# Patient Record
Sex: Female | Born: 1950 | Race: Black or African American | Hispanic: No | Marital: Married | Smoking: Never smoker
Health system: Southern US, Community
[De-identification: ages and names within clinical notes are randomized; demographics above are authoritative.]

## PROBLEM LIST (undated history)

## (undated) DIAGNOSIS — T7840XA Allergy, unspecified, initial encounter: Secondary | ICD-10-CM

## (undated) DIAGNOSIS — M170 Bilateral primary osteoarthritis of knee: Secondary | ICD-10-CM

## (undated) DIAGNOSIS — M1990 Unspecified osteoarthritis, unspecified site: Secondary | ICD-10-CM

## (undated) DIAGNOSIS — K219 Gastro-esophageal reflux disease without esophagitis: Secondary | ICD-10-CM

## (undated) DIAGNOSIS — I10 Essential (primary) hypertension: Secondary | ICD-10-CM

## (undated) DIAGNOSIS — S2239XA Fracture of one rib, unspecified side, initial encounter for closed fracture: Secondary | ICD-10-CM

## (undated) DIAGNOSIS — S2249XA Multiple fractures of ribs, unspecified side, initial encounter for closed fracture: Secondary | ICD-10-CM

## (undated) DIAGNOSIS — K5792 Diverticulitis of intestine, part unspecified, without perforation or abscess without bleeding: Secondary | ICD-10-CM

## (undated) DIAGNOSIS — G5600 Carpal tunnel syndrome, unspecified upper limb: Secondary | ICD-10-CM

## (undated) HISTORY — PX: UPPER GASTROINTESTINAL ENDOSCOPY: SHX188

## (undated) HISTORY — DX: Multiple fractures of ribs, unspecified side, initial encounter for closed fracture: S22.49XA

## (undated) HISTORY — DX: Bilateral primary osteoarthritis of knee: M17.0

## (undated) HISTORY — DX: Allergy, unspecified, initial encounter: T78.40XA

## (undated) HISTORY — DX: Essential (primary) hypertension: I10

## (undated) HISTORY — DX: Fracture of one rib, unspecified side, initial encounter for closed fracture: S22.39XA

## (undated) HISTORY — DX: Unspecified osteoarthritis, unspecified site: M19.90

## (undated) HISTORY — PX: COLONOSCOPY: SHX174

## (undated) HISTORY — DX: Gastro-esophageal reflux disease without esophagitis: K21.9

## (undated) HISTORY — DX: Carpal tunnel syndrome, unspecified upper limb: G56.00

---

## 1997-06-17 HISTORY — PX: SHOULDER SURGERY: SHX246

## 1997-10-12 ENCOUNTER — Encounter: Admission: RE | Admit: 1997-10-12 | Discharge: 1998-01-10 | Payer: Self-pay | Admitting: Orthopedic Surgery

## 1998-01-17 ENCOUNTER — Encounter: Admission: RE | Admit: 1998-01-17 | Discharge: 1998-04-17 | Payer: Self-pay | Admitting: Orthopedic Surgery

## 1998-06-12 ENCOUNTER — Encounter: Payer: Self-pay | Admitting: Orthopedic Surgery

## 1998-06-12 ENCOUNTER — Ambulatory Visit (HOSPITAL_COMMUNITY): Admission: RE | Admit: 1998-06-12 | Discharge: 1998-06-12 | Payer: Self-pay | Admitting: Orthopedic Surgery

## 1998-06-17 HISTORY — PX: CHOLECYSTECTOMY: SHX55

## 1998-06-29 ENCOUNTER — Encounter: Admission: RE | Admit: 1998-06-29 | Discharge: 1998-09-27 | Payer: Self-pay | Admitting: Orthopedic Surgery

## 1998-10-02 ENCOUNTER — Encounter: Admission: RE | Admit: 1998-10-02 | Discharge: 1998-11-02 | Payer: Self-pay

## 1998-10-30 ENCOUNTER — Encounter: Payer: Self-pay | Admitting: Orthopedic Surgery

## 1998-10-30 ENCOUNTER — Ambulatory Visit (HOSPITAL_COMMUNITY): Admission: RE | Admit: 1998-10-30 | Discharge: 1998-10-30 | Payer: Self-pay | Admitting: Orthopedic Surgery

## 1998-12-08 ENCOUNTER — Ambulatory Visit (HOSPITAL_COMMUNITY): Admission: RE | Admit: 1998-12-08 | Discharge: 1998-12-08 | Payer: Self-pay | Admitting: Orthopedic Surgery

## 1999-02-01 ENCOUNTER — Other Ambulatory Visit: Admission: RE | Admit: 1999-02-01 | Discharge: 1999-02-01 | Payer: Self-pay | Admitting: Obstetrics

## 1999-02-28 ENCOUNTER — Ambulatory Visit (HOSPITAL_COMMUNITY): Admission: RE | Admit: 1999-02-28 | Discharge: 1999-02-28 | Payer: Self-pay | Admitting: Cardiology

## 1999-03-30 ENCOUNTER — Encounter: Payer: Self-pay | Admitting: Cardiology

## 1999-03-30 ENCOUNTER — Ambulatory Visit (HOSPITAL_COMMUNITY): Admission: RE | Admit: 1999-03-30 | Discharge: 1999-03-30 | Payer: Self-pay | Admitting: Cardiology

## 1999-06-20 ENCOUNTER — Ambulatory Visit (HOSPITAL_COMMUNITY): Admission: RE | Admit: 1999-06-20 | Discharge: 1999-06-20 | Payer: Self-pay | Admitting: Orthopedic Surgery

## 1999-06-20 ENCOUNTER — Encounter: Payer: Self-pay | Admitting: Orthopedic Surgery

## 1999-07-02 ENCOUNTER — Encounter (HOSPITAL_BASED_OUTPATIENT_CLINIC_OR_DEPARTMENT_OTHER): Payer: Self-pay | Admitting: General Surgery

## 1999-07-04 ENCOUNTER — Ambulatory Visit (HOSPITAL_COMMUNITY): Admission: RE | Admit: 1999-07-04 | Discharge: 1999-07-05 | Payer: Self-pay | Admitting: General Surgery

## 1999-07-04 ENCOUNTER — Encounter (HOSPITAL_BASED_OUTPATIENT_CLINIC_OR_DEPARTMENT_OTHER): Payer: Self-pay | Admitting: General Surgery

## 1999-07-04 ENCOUNTER — Encounter (INDEPENDENT_AMBULATORY_CARE_PROVIDER_SITE_OTHER): Payer: Self-pay | Admitting: *Deleted

## 1999-08-02 ENCOUNTER — Encounter (HOSPITAL_BASED_OUTPATIENT_CLINIC_OR_DEPARTMENT_OTHER): Payer: Self-pay | Admitting: General Surgery

## 1999-08-02 ENCOUNTER — Ambulatory Visit (HOSPITAL_COMMUNITY): Admission: RE | Admit: 1999-08-02 | Discharge: 1999-08-02 | Payer: Self-pay | Admitting: General Surgery

## 2000-02-18 ENCOUNTER — Inpatient Hospital Stay (HOSPITAL_COMMUNITY): Admission: EM | Admit: 2000-02-18 | Discharge: 2000-02-19 | Payer: Self-pay | Admitting: *Deleted

## 2000-02-18 ENCOUNTER — Encounter: Payer: Self-pay | Admitting: Emergency Medicine

## 2000-02-19 ENCOUNTER — Encounter: Payer: Self-pay | Admitting: Cardiovascular Disease

## 2000-04-25 ENCOUNTER — Encounter: Admission: RE | Admit: 2000-04-25 | Discharge: 2000-05-20 | Payer: Self-pay | Admitting: Orthopedic Surgery

## 2000-05-22 ENCOUNTER — Ambulatory Visit (HOSPITAL_COMMUNITY): Admission: RE | Admit: 2000-05-22 | Discharge: 2000-05-22 | Payer: Self-pay | Admitting: Gastroenterology

## 2000-05-22 ENCOUNTER — Encounter (INDEPENDENT_AMBULATORY_CARE_PROVIDER_SITE_OTHER): Payer: Self-pay | Admitting: Specialist

## 2000-10-10 ENCOUNTER — Ambulatory Visit (HOSPITAL_COMMUNITY): Admission: RE | Admit: 2000-10-10 | Discharge: 2000-10-10 | Payer: Self-pay | Admitting: Gastroenterology

## 2000-11-06 ENCOUNTER — Other Ambulatory Visit: Admission: RE | Admit: 2000-11-06 | Discharge: 2000-11-06 | Payer: Self-pay | Admitting: Obstetrics

## 2000-11-10 ENCOUNTER — Encounter: Payer: Self-pay | Admitting: Cardiology

## 2000-11-10 ENCOUNTER — Ambulatory Visit (HOSPITAL_COMMUNITY): Admission: RE | Admit: 2000-11-10 | Discharge: 2000-11-10 | Payer: Self-pay | Admitting: Cardiology

## 2000-11-12 ENCOUNTER — Ambulatory Visit (HOSPITAL_COMMUNITY): Admission: RE | Admit: 2000-11-12 | Discharge: 2000-11-12 | Payer: Self-pay | Admitting: Obstetrics

## 2000-11-12 ENCOUNTER — Encounter: Payer: Self-pay | Admitting: Obstetrics

## 2001-09-04 ENCOUNTER — Ambulatory Visit (HOSPITAL_COMMUNITY): Admission: RE | Admit: 2001-09-04 | Discharge: 2001-09-04 | Payer: Self-pay | Admitting: Cardiology

## 2001-09-04 ENCOUNTER — Encounter: Payer: Self-pay | Admitting: Cardiology

## 2001-09-22 ENCOUNTER — Encounter: Payer: Self-pay | Admitting: Thoracic Surgery (Cardiothoracic Vascular Surgery)

## 2001-09-22 ENCOUNTER — Encounter
Admission: RE | Admit: 2001-09-22 | Discharge: 2001-09-22 | Payer: Self-pay | Admitting: Thoracic Surgery (Cardiothoracic Vascular Surgery)

## 2002-01-11 ENCOUNTER — Encounter
Admission: RE | Admit: 2002-01-11 | Discharge: 2002-01-11 | Payer: Self-pay | Admitting: Thoracic Surgery (Cardiothoracic Vascular Surgery)

## 2002-01-11 ENCOUNTER — Encounter: Payer: Self-pay | Admitting: Thoracic Surgery (Cardiothoracic Vascular Surgery)

## 2002-10-22 ENCOUNTER — Encounter: Admission: RE | Admit: 2002-10-22 | Discharge: 2002-10-22 | Payer: Self-pay | Admitting: Obstetrics

## 2002-10-22 ENCOUNTER — Encounter: Payer: Self-pay | Admitting: Obstetrics

## 2002-12-16 ENCOUNTER — Ambulatory Visit (HOSPITAL_COMMUNITY): Admission: RE | Admit: 2002-12-16 | Discharge: 2002-12-16 | Payer: Self-pay | Admitting: Orthopedic Surgery

## 2002-12-16 ENCOUNTER — Encounter: Payer: Self-pay | Admitting: Orthopedic Surgery

## 2004-05-09 ENCOUNTER — Encounter: Admission: RE | Admit: 2004-05-09 | Discharge: 2004-05-09 | Payer: Self-pay | Admitting: Obstetrics

## 2004-05-28 ENCOUNTER — Encounter: Admission: RE | Admit: 2004-05-28 | Discharge: 2004-05-28 | Payer: Self-pay | Admitting: Obstetrics

## 2006-01-07 ENCOUNTER — Ambulatory Visit (HOSPITAL_COMMUNITY): Admission: RE | Admit: 2006-01-07 | Discharge: 2006-01-07 | Payer: Self-pay | Admitting: Obstetrics

## 2008-09-27 ENCOUNTER — Encounter: Admission: RE | Admit: 2008-09-27 | Discharge: 2008-09-27 | Payer: Self-pay | Admitting: Cardiology

## 2008-09-28 ENCOUNTER — Encounter: Admission: RE | Admit: 2008-09-28 | Discharge: 2008-09-28 | Payer: Self-pay | Admitting: Cardiology

## 2009-06-17 LAB — HM COLONOSCOPY: HM Colonoscopy: NORMAL

## 2010-06-17 LAB — HM MAMMOGRAPHY: HM Mammogram: NORMAL

## 2010-09-16 LAB — HM PAP SMEAR: HM Pap smear: NORMAL
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
## 2010-11-02 NOTE — Op Note (Signed)
[place]  Patient:    Veronica [name]                     MRN: [id number] Proc. Date: 07/04/99 Adm. Date:  [id number] Attending:  Sonda Primes CC:         Mardene Celeste. Lurene Shadow, M.D. x 2                           Operative Report  PREOPERATIVE DIAGNOSIS:  Chronic calculous cholecystitis.  POSTOPERATIVE DIAGNOSIS:  Chronic calculous cholecystitis.  OPERATION:  Laparoscopic cholecystectomy with intraoperative cholangiogram.  SURGEON:  Luisa Hart L. Lurene Shadow, M.D.  ASSISTANTS: 1. Marnee Spring. Wiliam Ke, M.D. 2. Ocie Cornfield, P.A.S.  ANESTHESIA:  General.  INDICATION:  This patient is a 60 year old woman presenting with right upper quadrant symptoms associated with postprandial nausea and vomiting.  Normal liver function studies.  No ______ seemingly associated.  Gallbladder ultrasound shows multiple gallstones with a stone wedged in the neck of the gallbladder.  She is brought to the operating room now for laparoscopic cholecystectomy.  DESCRIPTION OF PROCEDURE:  Following the induction of satisfactory anesthesia, he patient positioned supinely, the abdomen was routinely prepped and draped to be  included in a sterile operative field.  An open laparoscopy was created at the umbilicus with insufflation of the peritoneal cavity to 14 mmHg pressure using carbon dioxide.  Visual exploration of the abdomen ensued.  None of the small or large intestines appeared to be abnormal. The anterior gastric wall and duodenum appeared to be normal.  Pelvic organs were not visualized.  Under direct vision, epigastric and lateral ports are placed.  The gallbladder s grasped and noted to be very large, chronically scarred, and quite hydropic.  I  aspirated approximately 90 cc of white bile from the gallbladder and noted that the stone was wedged within the neck of the gallbladder.  The gallbladder was then retracted cephalad and dissection carried  down to the region of the hepatoduodenal ligament with isolation of the cystic artery and the cystic duct.  Cystic artery doubly clipped and transected.  The cystic duct was  then opened and cystic duct cholangiogram carried out with 1-1/2 strength Hipaque dye.  The results of the cholangiogram showed a dilated common bile duct without any filling defects.  There is normal tapering in the distal common bile duct with ree flow of dye into the duodenum.  The intrahepatic radicals appeared to be normal and free of stones.  Cholangiocatheter was removed and the cystic duct was triply cut and then transected from the gallbladder and then dissected free from the liver bed using electrocautery and maintaining hemostasis throughout the course of the dissection.  At the end of the dissection, the right upper quadrant was then checked for additional bleeding points and these were cauterized with electrocautery.  The right upper quadrant was then thoroughly irrigated with normal saline and excess saline aspirated.  The camera was then placed in the epigastric port and the gallbladder was then placed in the endocatch pouch and retrieved through the umbilicus.  Because of the size of the gallbladder, the umbilical wound had to e extended so as to accommodate the removal of the gallbladder.  Sponge, needle, and sharp counts were verified.  The umbilical wound was closed in two layers with 0 Dexon and 4-0 Dexon.  The epigastric and lateral wound was closed with 4-0 Dexon.  All wounds were reinforced with  Steri-Strips.  Sterile dressing was applied.  Anesthetic reversed.  The patient was moved from the operating room to the recovery room in stable condition having tolerated the procedure well. DD:  07/04/99 TD:  07/04/99 Job: [id number]

## 2010-12-27 ENCOUNTER — Ambulatory Visit (INDEPENDENT_AMBULATORY_CARE_PROVIDER_SITE_OTHER): Payer: BC Managed Care – PPO | Admitting: Internal Medicine

## 2010-12-27 ENCOUNTER — Encounter: Payer: Self-pay | Admitting: Internal Medicine

## 2010-12-27 ENCOUNTER — Other Ambulatory Visit (INDEPENDENT_AMBULATORY_CARE_PROVIDER_SITE_OTHER): Payer: BC Managed Care – PPO

## 2010-12-27 VITALS — BP 132/76 | HR 93 | Temp 98.7°F | Ht 66.0 in | Wt 306.6 lb

## 2010-12-27 DIAGNOSIS — Z0000 Encounter for general adult medical examination without abnormal findings: Secondary | ICD-10-CM

## 2010-12-27 DIAGNOSIS — Z23 Encounter for immunization: Secondary | ICD-10-CM

## 2010-12-27 DIAGNOSIS — J309 Allergic rhinitis, unspecified: Secondary | ICD-10-CM | POA: Insufficient documentation

## 2010-12-27 DIAGNOSIS — Z0001 Encounter for general adult medical examination with abnormal findings: Secondary | ICD-10-CM | POA: Insufficient documentation

## 2010-12-27 DIAGNOSIS — K219 Gastro-esophageal reflux disease without esophagitis: Secondary | ICD-10-CM | POA: Insufficient documentation

## 2010-12-27 DIAGNOSIS — I10 Essential (primary) hypertension: Secondary | ICD-10-CM

## 2010-12-27 DIAGNOSIS — M1990 Unspecified osteoarthritis, unspecified site: Secondary | ICD-10-CM | POA: Insufficient documentation

## 2010-12-27 DIAGNOSIS — J310 Chronic rhinitis: Secondary | ICD-10-CM | POA: Insufficient documentation

## 2010-12-27 DIAGNOSIS — T7840XA Allergy, unspecified, initial encounter: Secondary | ICD-10-CM | POA: Insufficient documentation

## 2010-12-27 DIAGNOSIS — G5600 Carpal tunnel syndrome, unspecified upper limb: Secondary | ICD-10-CM

## 2010-12-27 HISTORY — DX: Carpal tunnel syndrome, unspecified upper limb: G56.00

## 2010-12-27 LAB — HEPATIC FUNCTION PANEL
ALT: 16 U/L (ref 0–35)
AST: 18 U/L (ref 0–37)
Albumin: 4 g/dL (ref 3.5–5.2)
Alkaline Phosphatase: 80 U/L (ref 39–117)
Bilirubin, Direct: 0.1 mg/dL (ref 0.0–0.3)
Total Bilirubin: 1 mg/dL (ref 0.3–1.2)
Total Protein: 7.7 g/dL (ref 6.0–8.3)

## 2010-12-27 LAB — LIPID PANEL
Cholesterol: 149 mg/dL (ref 0–200)
HDL: 39.8 mg/dL (ref >39.00)
LDL Cholesterol: 94 mg/dL (ref 0–99)
Total CHOL/HDL Ratio: 4
Triglycerides: 76 mg/dL (ref 0.0–149.0)
VLDL: 15.2 mg/dL (ref 0.0–40.0)

## 2010-12-27 LAB — CBC WITH DIFFERENTIAL/PLATELET
Basophils Absolute: 0 10*3/uL (ref 0.0–0.1)
Basophils Relative: 0.5 % (ref 0.0–3.0)
Eosinophils Absolute: 0.2 10*3/uL (ref 0.0–0.7)
Eosinophils Relative: 2.3 % (ref 0.0–5.0)
HCT: 40.6 % (ref 36.0–46.0)
Hemoglobin: 13.6 g/dL (ref 12.0–15.0)
Lymphocytes Relative: 27.7 % (ref 12.0–46.0)
Lymphs Abs: 2.3 10*3/uL (ref 0.7–4.0)
MCHC: 33.5 g/dL (ref 30.0–36.0)
MCV: 91.7 fl (ref 78.0–100.0)
Monocytes Absolute: 0.6 10*3/uL (ref 0.1–1.0)
Monocytes Relative: 6.8 % (ref 3.0–12.0)
Neutro Abs: 5.1 10*3/uL (ref 1.4–7.7)
Neutrophils Relative %: 62.7 % (ref 43.0–77.0)
Platelets: 224 10*3/uL (ref 150.0–400.0)
RBC: 4.43 Mil/uL (ref 3.87–5.11)
RDW: 13.7 % (ref 11.5–14.6)
WBC: 8.1 10*3/uL (ref 4.5–10.5)

## 2010-12-27 LAB — BASIC METABOLIC PANEL
BUN: 16 mg/dL (ref 6–23)
CO2: 29 mEq/L (ref 19–32)
Calcium: 8.6 mg/dL (ref 8.4–10.5)
Chloride: 97 mEq/L (ref 96–112)
Creatinine, Ser: 0.9 mg/dL (ref 0.4–1.2)
GFR: 82.76 mL/min (ref >60.00)
Glucose, Bld: 97 mg/dL (ref 70–99)
Potassium: 4.8 mEq/L (ref 3.5–5.1)
Sodium: 138 mEq/L (ref 135–145)

## 2010-12-27 LAB — URINALYSIS, ROUTINE W REFLEX MICROSCOPIC
Bilirubin Urine: NEGATIVE
Hgb urine dipstick: NEGATIVE
Ketones, ur: NEGATIVE
Leukocytes, UA: NEGATIVE
Nitrite: NEGATIVE
Specific Gravity, Urine: 1.01 (ref 1.000–1.030)
Total Protein, Urine: NEGATIVE
Urine Glucose: NEGATIVE
Urobilinogen, UA: 0.2 (ref 0.0–1.0)
pH: 7 (ref 5.0–8.0)

## 2010-12-27 LAB — TSH: TSH: 3.55 u[IU]/mL (ref 0.35–5.50)

## 2010-12-27 MED ORDER — IRBESARTAN-HYDROCHLOROTHIAZIDE 300-25 MG PO TABS: 1.0000 | ORAL_TABLET | Freq: Every day | ORAL | Status: DC

## 2010-12-27 MED ORDER — TETANUS-DIPHTH-ACELL PERTUSSIS 5-2.5-18.5 LF-MCG/0.5 IM SUSP: 0.5000 mL | Freq: Once | INTRAMUSCULAR | Status: DC

## 2010-12-27 NOTE — Progress Notes (Signed)
Quick Note:  Voice message left on PhoneTree system - lab is negative, normal or otherwise stable, pt to continue same tx ______ 

## 2010-12-27 NOTE — Patient Instructions (Addendum)
You had the tetanus shot today Please go to LAB in the Basement for the blood and/or urine tests to be done today Please call the phone number [phone] (the PhoneTree System) for results of testing in 2-3 days;  When calling, simply dial the number, and when prompted enter the MRN number above (the Medical Record Number) and the # key, then the message should start. Your EKG was ok today Please increase the avalide (generic) to the higher strength Continue all other medications as before Please check your blood pressure on a regular basis;  Your goal is to be < 140/90 Please return in 6 mo with Lab testing done 3-5 days before

## 2010-12-27 NOTE — Assessment & Plan Note (Addendum)

## 2010-12-27 NOTE — Progress Notes (Signed)
Subjective:    Patient ID: Veronica [name], female    DOB: 08-Oct-1950, 60 y.o.   MRN: [id number]  HPI  Here for wellness and f/u;  Overall doing ok;  Pt denies CP, worsening SOB, DOE, wheezing, orthopnea, PND, worsening LE edema, palpitations, dizziness or syncope.  Pt denies neurological change such as new Headache, facial or extremity weakness.  Pt denies polydipsia, polyuria, or low sugar symptoms. Pt states overall good compliance with treatment and medications, good tolerability, and trying to follow lower cholesterol diet.  Pt denies worsening depressive symptoms, suicidal ideation or panic. No fever, wt loss, night sweats, loss of appetite, or other constitutional symptoms.  Pt states good ability with ADL's, low fall risk, home safety reviewed and adequate, no significant changes in hearing or vision, and occasionally active with exercise.  Does have ongoing bilat pain to the knees with DJD,  Also recent laser tx for gum dz recently, BP has been elev then and several times this past yr, today seems exceptionally good to her.  Has had some pedal edema more in the past few months, which is new for her, also pain to left heel. Past Medical History  Diagnosis Date  . Broken ribs     hx of  . Hemorrhoids   . Allergy   . Arthritis   . GERD (gastroesophageal reflux disease)   . Hypertension   . Carpal tunnel syndrome 12/27/2010   Past Surgical History  Procedure Date  . Cholecystectomy 2000    reports that she has never smoked. She does not have any smokeless tobacco history on file. She reports that she does not drink alcohol or use illicit drugs. family history includes Alcohol abuse in her other; Arthritis in her other; Cancer in her other; Heart disease in her others; and Hypertension in her other. Allergies  Allergen Reactions  . Biaxin    No current outpatient prescriptions on file prior to visit.   No current facility-administered medications on file prior to visit.   Review  of Systems Review of Systems  Constitutional: Negative for diaphoresis, activity change, appetite change and unexpected weight change.  HENT: Negative for hearing loss, ear pain, facial swelling, mouth sores and neck stiffness.   Eyes: Negative for pain, redness and visual disturbance.  Respiratory: Negative for shortness of breath and wheezing.   Cardiovascular: Negative for chest pain and palpitations.  Gastrointestinal: Negative for diarrhea, blood in stool, abdominal distention and rectal pain.  Genitourinary: Negative for hematuria, flank pain and decreased urine volume.  Musculoskeletal: Negative for myalgias and joint swelling.  Skin: Negative for color change and wound.  Neurological: Negative for syncope and numbness.  Hematological: Negative for adenopathy.  Psychiatric/Behavioral: Negative for hallucinations, self-injury, decreased concentration and agitation.      Objective:   Physical Exam BP 132/76  Pulse 93  Temp(Src) 98.7 F (37.1 C) (Oral)  Ht 5\' 6"  (1.676 m)  Wt 306 lb 9.6 oz (139.073 kg)  BMI 49.49 kg/m2  SpO2 97% Physical Exam  VS noted morbid obese Constitutional: Pt is oriented to person, place, and time.  Head: Normocephalic and atraumatic.  Right Ear: External ear normal.  Left Ear: External ear normal.  Nose: Nose normal.  Mouth/Throat: Oropharynx is clear and moist.  Pharynx benign Eyes: Conjunctivae and EOM are normal. Pupils are equal, round, and reactive to light.  Neck: Normal range of motion. Neck supple. No JVD present. No tracheal deviation present.  Cardiovascular: Normal rate, regular rhythm, normal heart sounds and intact  distal pulses.   Pulmonary/Chest: Effort normal and breath sounds normal.  Abdominal: Soft. Bowel sounds are normal. There is no tenderness.  Musculoskeletal: Normal range of motion. Exhibits trace pedal edema bilat.  Lymphadenopathy:  Has no cervical adenopathy.  Neurological: Pt is alert and oriented to person, place, and  time. Pt has normal reflexes. No cranial nerve deficit.  Skin: Skin is warm and dry. No rash noted.  Psychiatric:  Has  normal mood and affect. Behavior is normal. 1+ nervous        Assessment & Plan:

## 2010-12-27 NOTE — Assessment & Plan Note (Signed)
Uncontrolled with mild pedal edema in the setting of marked LE varicosities as well;  Will check ecg, routine labs, but also increase the avalide to 300/25, f/u BP at home and next visit

## 2011-01-30 ENCOUNTER — Telehealth: Payer: Self-pay | Admitting: *Deleted

## 2011-01-30 NOTE — Telephone Encounter (Signed)
Does have pharmacy have suggestion for what to change to?

## 2011-01-30 NOTE — Telephone Encounter (Signed)
[facility] called regarding Avalide rx. Pt's insurance will not cover medication-pleas advise.

## 2011-01-31 NOTE — Telephone Encounter (Signed)
Information requested from pharmacy

## 2011-02-06 NOTE — Telephone Encounter (Signed)
Per pharmacy alternative medication not needed.

## 2011-02-07 ENCOUNTER — Telehealth: Payer: Self-pay

## 2011-02-07 NOTE — Telephone Encounter (Signed)
Pharmacist called stating pt's Avalide is npt covered for brand name but generic 150-12.5 2 tabs qd is covered. Okay to change to generic?

## 2011-07-18 ENCOUNTER — Encounter: Payer: Self-pay | Admitting: Internal Medicine

## 2011-07-18 ENCOUNTER — Ambulatory Visit (INDEPENDENT_AMBULATORY_CARE_PROVIDER_SITE_OTHER)
Admission: RE | Admit: 2011-07-18 | Discharge: 2011-07-18 | Disposition: A | Discharge status: Home / Self Care | Payer: BC Managed Care – PPO | Source: Ambulatory Visit | Attending: Internal Medicine | Admitting: Internal Medicine

## 2011-07-18 ENCOUNTER — Ambulatory Visit (INDEPENDENT_AMBULATORY_CARE_PROVIDER_SITE_OTHER): Payer: BC Managed Care – PPO | Admitting: Internal Medicine

## 2011-07-18 VITALS — BP 132/86 | HR 101 | Temp 97.6°F | Ht 66.0 in | Wt 304.0 lb

## 2011-07-18 DIAGNOSIS — R0600 Dyspnea, unspecified: Secondary | ICD-10-CM

## 2011-07-18 DIAGNOSIS — R358 Other polyuria: Secondary | ICD-10-CM

## 2011-07-18 DIAGNOSIS — R3589 Other polyuria: Secondary | ICD-10-CM

## 2011-07-18 DIAGNOSIS — R0609 Other forms of dyspnea: Secondary | ICD-10-CM

## 2011-07-18 DIAGNOSIS — R0989 Other specified symptoms and signs involving the circulatory and respiratory systems: Secondary | ICD-10-CM

## 2011-07-18 DIAGNOSIS — M25569 Pain in unspecified knee: Secondary | ICD-10-CM

## 2011-07-18 DIAGNOSIS — I10 Essential (primary) hypertension: Secondary | ICD-10-CM

## 2011-07-18 DIAGNOSIS — M25561 Pain in right knee: Secondary | ICD-10-CM

## 2011-07-18 DIAGNOSIS — Z0000 Encounter for general adult medical examination without abnormal findings: Secondary | ICD-10-CM

## 2011-07-18 MED ORDER — NAPROXEN 500 MG PO TABS: 500.0000 mg | ORAL_TABLET | Freq: Two times a day (BID) | ORAL | Status: DC

## 2011-07-18 NOTE — Patient Instructions (Signed)
Please reduce fluids to on more than 4 glasses water per day Please go to XRAY in the Basement for the x-ray test You will be contacted regarding the referral for: echocardiogram Continue all other medications as before You can also start glucosamine for the right knee, and tylenol as needed for pain Please return in 6 mo with Lab testing done 3-5 days before

## 2011-07-20 ENCOUNTER — Encounter: Payer: Self-pay | Admitting: Internal Medicine

## 2011-07-20 DIAGNOSIS — R358 Other polyuria: Secondary | ICD-10-CM | POA: Insufficient documentation

## 2011-07-20 DIAGNOSIS — R3589 Other polyuria: Secondary | ICD-10-CM | POA: Insufficient documentation

## 2011-07-20 DIAGNOSIS — M25561 Pain in right knee: Secondary | ICD-10-CM | POA: Insufficient documentation

## 2011-07-20 NOTE — Assessment & Plan Note (Signed)
Exam benign, suspect mild OA, for glucosamine otc daily

## 2011-07-20 NOTE — Assessment & Plan Note (Signed)
Suspect c/w deconditioning/obesity, most recent ecg/labs reviewed with pt,  For CXR, Echo,  to f/u any worsening symptoms or concerns, consider PFT's

## 2011-07-20 NOTE — Assessment & Plan Note (Signed)
stable overall by hx and exam, most recent data reviewed with pt, and pt to continue medical treatment as before  Lab Results  Component Value Date   WBC 8.1 12/27/2010   HGB 13.6 12/27/2010   HCT 40.6 12/27/2010   PLT 224.0 12/27/2010   GLUCOSE 97 12/27/2010   CHOL 149 12/27/2010   TRIG 76.0 12/27/2010   HDL 39.80 12/27/2010   LDLCALC 94 12/27/2010   ALT 16 12/27/2010   AST 18 12/27/2010   NA 138 12/27/2010   K 4.8 12/27/2010   CL 97 12/27/2010   CREATININE 0.9 12/27/2010   BUN 16 12/27/2010   CO2 29 12/27/2010   TSH 3.55 12/27/2010   Asked pt to reduce po fluids

## 2011-07-20 NOTE — Assessment & Plan Note (Signed)
stable overall by hx and exam, most recent data reviewed with pt, and pt to continue medical treatment as before  BP Readings from Last 3 Encounters:  07/18/11 132/86  12/27/10 132/76

## 2011-07-20 NOTE — Progress Notes (Signed)
  Subjective:    Patient ID: Veronica [name], female    DOB: November 26, 1950, 61 y.o.   MRN: [id number]  HPI  Here to f/u;  C/o vague DOE without apparent recent worsening or ilness today, ongoing for > 6 mo, Pt denies chest pain, wheezing, orthopnea, PND, increased LE swelling, palpitations, dizziness or syncope.  Pt denies new neurological symptoms such as new headache, or facial or extremity weakness or numbness   Pt denies polydipsia, polyuria except increased polyruia recent as she has increased her fluid intake to 8 glasses water per day,  Pt states overall good compliance with meds, trying to follow lower cholesterol, wt overall stable but little exercise however., mostly due to right knee pain ongoing without sweling but with recurring mid discomfort.    Pt denies fever, wt loss, night sweats, loss of appetite, or other constitutional symptoms  Overall good compliance with treatment, and good medicine tolerability.  Denies worsening depressive symptoms, suicidal ideation, or panic. Past Medical History  Diagnosis Date  . Broken ribs     hx of  . Hemorrhoids   . Allergy   . Arthritis   . GERD (gastroesophageal reflux disease)   . Hypertension   . Carpal tunnel syndrome 12/27/2010   Past Surgical History  Procedure Date  . Cholecystectomy 2000    reports that she has never smoked. She does not have any smokeless tobacco history on file. She reports that she does not drink alcohol or use illicit drugs. family history includes Alcohol abuse in her other; Arthritis in her other; Cancer in her other; Heart disease in her others; and Hypertension in her other. Allergies  Allergen Reactions  . Biaxin    Current Outpatient Prescriptions on File Prior to Visit  Medication Sig Dispense Refill  . irbesartan-hydrochlorothiazide (AVALIDE) 300-25 MG per tablet Take 1 tablet by mouth daily.  90 tablet  3   Current Facility-Administered Medications on File Prior to Visit  Medication Dose Route  Frequency Provider Last Rate Last Dose  . TDaP (BOOSTRIX) injection 0.5 mL  0.5 mL Intramuscular Once Oliver Barre, MD       Review of Systems Review of Systems  Constitutional: Negative for diaphoresis and unexpected weight change.  HENT: Negative for drooling and tinnitus.   Eyes: Negative for photophobia and visual disturbance.  Respiratory: Negative for choking and stridor.   Gastrointestinal: Negative for vomiting and blood in stool.  Genitourinary: Negative for hematuria and decreased urine volume.     Objective:   Physical Exam BP 132/86  Pulse 101  Temp(Src) 97.6 F (36.4 C) (Oral)  Ht 5\' 6"  (1.676 m)  Wt 304 lb (137.893 kg)  BMI 49.07 kg/m2  SpO2 96% Physical Exam  VS noted, morbid obese Constitutional: Pt appears well-developed and well-nourished.  HENT: Head: Normocephalic.  Right Ear: External ear normal.  Left Ear: External ear normal.  Eyes: Conjunctivae and EOM are normal. Pupils are equal, round, and reactive to light.  Neck: Normal range of motion. Neck supple.  Cardiovascular: Normal rate and regular rhythm.   Pulmonary/Chest: Effort normal and breath sounds normal.  Abd:  Soft, NT, non-distended, + BS Neurological: Pt is alert. No cranial nerve deficit.  Skin: Skin is warm. No erythema.  Psychiatric: Pt behavior is normal. Thought content normal. 1+ nervous    Assessment & Plan:

## 2011-07-29 ENCOUNTER — Other Ambulatory Visit (HOSPITAL_COMMUNITY): Payer: Self-pay | Admitting: Radiology

## 2011-07-29 DIAGNOSIS — R0600 Dyspnea, unspecified: Secondary | ICD-10-CM

## 2011-07-30 ENCOUNTER — Ambulatory Visit (HOSPITAL_COMMUNITY): Payer: BC Managed Care – PPO | Attending: Cardiology | Admitting: Radiology

## 2011-07-30 ENCOUNTER — Other Ambulatory Visit: Payer: Self-pay

## 2011-07-30 DIAGNOSIS — I10 Essential (primary) hypertension: Secondary | ICD-10-CM | POA: Insufficient documentation

## 2011-07-30 DIAGNOSIS — R0609 Other forms of dyspnea: Secondary | ICD-10-CM | POA: Insufficient documentation

## 2011-07-30 DIAGNOSIS — E785 Hyperlipidemia, unspecified: Secondary | ICD-10-CM | POA: Insufficient documentation

## 2011-07-30 DIAGNOSIS — E669 Obesity, unspecified: Secondary | ICD-10-CM | POA: Insufficient documentation

## 2011-07-30 DIAGNOSIS — R0600 Dyspnea, unspecified: Secondary | ICD-10-CM

## 2011-07-30 DIAGNOSIS — R0989 Other specified symptoms and signs involving the circulatory and respiratory systems: Secondary | ICD-10-CM | POA: Insufficient documentation

## 2011-09-05 ENCOUNTER — Ambulatory Visit (INDEPENDENT_AMBULATORY_CARE_PROVIDER_SITE_OTHER): Payer: BC Managed Care – PPO | Admitting: Internal Medicine

## 2011-09-05 ENCOUNTER — Encounter: Payer: Self-pay | Admitting: Internal Medicine

## 2011-09-05 VITALS — BP 132/80 | HR 84 | Temp 97.9°F

## 2011-09-05 DIAGNOSIS — H109 Unspecified conjunctivitis: Secondary | ICD-10-CM

## 2011-09-05 DIAGNOSIS — J209 Acute bronchitis, unspecified: Secondary | ICD-10-CM

## 2011-09-05 MED ORDER — AMOXICILLIN-POT CLAVULANATE 875-125 MG PO TABS: 1.0000 | ORAL_TABLET | Freq: Two times a day (BID) | ORAL | Status: AC

## 2011-09-05 MED ORDER — POLYMYXIN B-TRIMETHOPRIM 10000-0.1 UNIT/ML-% OP SOLN: 1.0000 [drp] | OPHTHALMIC | Status: AC

## 2011-09-05 NOTE — Patient Instructions (Signed)
It was good to see you today. Augmentin antibiotics and eye drops - Your prescription(s) have been submitted to your pharmacy. Please take as directed and contact our office if you believe you are having problem(s) with the medication(s). Continue Robitussin DM for cough as needed Alternate between ibuprofen and tylenol for aches, pain and fever symptoms as discussed Hydrate, rest and call if symptoms worse or unimproved with treatment in next 10 days
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
## 2011-12-13 ENCOUNTER — Encounter: Payer: Self-pay | Admitting: Internal Medicine

## 2011-12-13 ENCOUNTER — Other Ambulatory Visit (INDEPENDENT_AMBULATORY_CARE_PROVIDER_SITE_OTHER): Payer: BC Managed Care – PPO

## 2011-12-13 DIAGNOSIS — Z0000 Encounter for general adult medical examination without abnormal findings: Secondary | ICD-10-CM

## 2011-12-13 LAB — HEPATIC FUNCTION PANEL
ALT: 16 U/L (ref 0–35)
AST: 20 U/L (ref 0–37)
Albumin: 3.7 g/dL (ref 3.5–5.2)
Alkaline Phosphatase: 85 U/L (ref 39–117)
Bilirubin, Direct: 0.2 mg/dL (ref 0.0–0.3)
Total Bilirubin: 1.5 mg/dL — ABNORMAL HIGH (ref 0.3–1.2)
Total Protein: 8 g/dL (ref 6.0–8.3)

## 2011-12-13 LAB — LIPID PANEL
Cholesterol: 160 mg/dL (ref 0–200)
HDL: 43.2 mg/dL (ref >39.00)
LDL Cholesterol: 100 mg/dL — ABNORMAL HIGH (ref 0–99)
Total CHOL/HDL Ratio: 4
Triglycerides: 84 mg/dL (ref 0.0–149.0)
VLDL: 16.8 mg/dL (ref 0.0–40.0)

## 2011-12-13 LAB — URINALYSIS, ROUTINE W REFLEX MICROSCOPIC
Bilirubin Urine: NEGATIVE
Hgb urine dipstick: NEGATIVE
Ketones, ur: NEGATIVE
Leukocytes, UA: NEGATIVE
Nitrite: NEGATIVE
Specific Gravity, Urine: 1.02 (ref 1.000–1.030)
Total Protein, Urine: NEGATIVE
Urine Glucose: NEGATIVE
Urobilinogen, UA: 0.2 (ref 0.0–1.0)
pH: 6 (ref 5.0–8.0)

## 2011-12-13 LAB — CBC WITH DIFFERENTIAL/PLATELET
Basophils Absolute: 0 10*3/uL (ref 0.0–0.1)
Basophils Relative: 0.2 % (ref 0.0–3.0)
Eosinophils Absolute: 0.2 10*3/uL (ref 0.0–0.7)
Eosinophils Relative: 1.6 % (ref 0.0–5.0)
HCT: 40 % (ref 36.0–46.0)
Hemoglobin: 13.2 g/dL (ref 12.0–15.0)
Lymphocytes Relative: 21.2 % (ref 12.0–46.0)
Lymphs Abs: 2.4 10*3/uL (ref 0.7–4.0)
MCHC: 33.1 g/dL (ref 30.0–36.0)
MCV: 89.8 fl (ref 78.0–100.0)
Monocytes Absolute: 0.8 10*3/uL (ref 0.1–1.0)
Monocytes Relative: 7.3 % (ref 3.0–12.0)
Neutro Abs: 7.8 10*3/uL — ABNORMAL HIGH (ref 1.4–7.7)
Neutrophils Relative %: 69.7 % (ref 43.0–77.0)
Platelets: 243 10*3/uL (ref 150.0–400.0)
RBC: 4.45 Mil/uL (ref 3.87–5.11)
RDW: 13.7 % (ref 11.5–14.6)
WBC: 11.3 10*3/uL — ABNORMAL HIGH (ref 4.5–10.5)

## 2011-12-13 LAB — BASIC METABOLIC PANEL
BUN: 18 mg/dL (ref 6–23)
CO2: 28 mEq/L (ref 19–32)
Calcium: 9.1 mg/dL (ref 8.4–10.5)
Chloride: 100 mEq/L (ref 96–112)
Creatinine, Ser: 0.9 mg/dL (ref 0.4–1.2)
GFR: 85.83 mL/min (ref >60.00)
Glucose, Bld: 85 mg/dL (ref 70–99)
Potassium: 3.8 mEq/L (ref 3.5–5.1)
Sodium: 136 mEq/L (ref 135–145)

## 2011-12-13 LAB — TSH: TSH: 3.65 u[IU]/mL (ref 0.35–5.50)

## 2012-01-04 ENCOUNTER — Other Ambulatory Visit: Payer: Self-pay | Admitting: Internal Medicine

## 2012-01-12 ENCOUNTER — Other Ambulatory Visit: Payer: Self-pay | Admitting: Internal Medicine

## 2012-01-16 ENCOUNTER — Other Ambulatory Visit: Payer: Self-pay | Admitting: Obstetrics

## 2012-01-16 DIAGNOSIS — Z1231 Encounter for screening mammogram for malignant neoplasm of breast: Secondary | ICD-10-CM

## 2012-01-16 LAB — HM MAMMOGRAPHY

## 2012-01-27 ENCOUNTER — Ambulatory Visit
Admission: RE | Admit: 2012-01-27 | Discharge: 2012-01-27 | Disposition: A | Discharge status: Home / Self Care | Payer: BC Managed Care – PPO | Source: Ambulatory Visit | Attending: Obstetrics | Admitting: Obstetrics

## 2012-01-27 DIAGNOSIS — Z1231 Encounter for screening mammogram for malignant neoplasm of breast: Secondary | ICD-10-CM

## 2012-02-18 ENCOUNTER — Other Ambulatory Visit: Payer: Self-pay | Admitting: Internal Medicine

## 2012-08-27 ENCOUNTER — Other Ambulatory Visit: Payer: Self-pay | Admitting: Internal Medicine

## 2012-09-30 ENCOUNTER — Other Ambulatory Visit: Payer: Self-pay | Admitting: Internal Medicine

## 2012-11-06 ENCOUNTER — Other Ambulatory Visit: Payer: Self-pay | Admitting: Internal Medicine

## 2012-11-26 ENCOUNTER — Telehealth: Payer: Self-pay | Admitting: Internal Medicine

## 2012-11-26 NOTE — Telephone Encounter (Signed)
Patient Information:  Caller Name: [facility]  Phone: [phone]  Patient: Veronica [name], Veronica [name]  Gender: Female  DOB: 06-Dec-1950  Age: 62 Years  PCP: Oliver Barre (Adults only)  Office Follow Up:  Does the office need to follow up with this patient?: No  Instructions For The Office: N/A  RN Note:  No known injury. Right lateral foot/ankle is painful when walks or stand and edematous.  Recommended rest, intermittent ice for pain and swelling, compression and elevation. Appointment scheduled.     Symptoms  Reason For Call & Symptoms: Swelling and moderate pain of right lateral ankle making it difficult to walk  Reviewed Health History In EMR: Yes  Reviewed Medications In EMR: Yes  Reviewed Allergies In EMR: Yes  Reviewed Surgeries / Procedures: Yes  Date of Onset of Symptoms: 11/12/2012  Treatments Tried: Takes Naproxen BID, using topical rubbing alcohol.  Treatments Tried Worked: No  Guideline(s) Used:  Ankle Pain  Disposition Per Guideline:   See Within 3 Days in Office  Reason For Disposition Reached:   Moderate pain (e.g., interferes with normal activities, limping) and present > 3 days  Advice Given:  Reassurance:  The symptoms you describe do not sound serious. You have told me that there is no redness, swelling, or fever. You have also told me that there has been no recent major injury.  Ankle pain can be caused be mild arthritis and other minor problems.  Expected Course:   Pain from a mild strain or joint irritation usually get better within a week.  If this does not get better during the next week, you should make an appointment to see your doctor.  Call Back If:  You become worse.  RN Overrode Recommendation:  Make Appointment  Advised to see MD within 24 hours due to coming weekend and moderate ankle pain with swelling.  Appointment Scheduled:  11/27/2012 09:00:00 Appointment Scheduled Provider:  Oliver Barre (Adults only)

## 2012-11-27 ENCOUNTER — Ambulatory Visit (INDEPENDENT_AMBULATORY_CARE_PROVIDER_SITE_OTHER): Payer: BC Managed Care – PPO | Admitting: Internal Medicine

## 2012-11-27 ENCOUNTER — Encounter: Payer: Self-pay | Admitting: Internal Medicine

## 2012-11-27 ENCOUNTER — Other Ambulatory Visit (INDEPENDENT_AMBULATORY_CARE_PROVIDER_SITE_OTHER): Payer: BC Managed Care – PPO

## 2012-11-27 VITALS — BP 110/82 | HR 98 | Temp 98.0°F | Ht 66.0 in | Wt 296.8 lb

## 2012-11-27 DIAGNOSIS — Z23 Encounter for immunization: Secondary | ICD-10-CM

## 2012-11-27 DIAGNOSIS — Z2911 Encounter for prophylactic immunotherapy for respiratory syncytial virus (RSV): Secondary | ICD-10-CM

## 2012-11-27 DIAGNOSIS — Z0000 Encounter for general adult medical examination without abnormal findings: Secondary | ICD-10-CM

## 2012-11-27 DIAGNOSIS — M25579 Pain in unspecified ankle and joints of unspecified foot: Secondary | ICD-10-CM

## 2012-11-27 DIAGNOSIS — M25571 Pain in right ankle and joints of right foot: Secondary | ICD-10-CM

## 2012-11-27 LAB — BASIC METABOLIC PANEL
BUN: 19 mg/dL (ref 6–23)
CO2: 26 mEq/L (ref 19–32)
Calcium: 9.7 mg/dL (ref 8.4–10.5)
Chloride: 103 mEq/L (ref 96–112)
Creatinine, Ser: 1 mg/dL (ref 0.4–1.2)
GFR: 73.59 mL/min (ref >60.00)
Glucose, Bld: 96 mg/dL (ref 70–99)
Potassium: 5.3 mEq/L — ABNORMAL HIGH (ref 3.5–5.1)
Sodium: 141 mEq/L (ref 135–145)

## 2012-11-27 LAB — CBC WITH DIFFERENTIAL/PLATELET
Basophils Absolute: 0 10*3/uL (ref 0.0–0.1)
Basophils Relative: 0.2 % (ref 0.0–3.0)
Eosinophils Absolute: 0.4 10*3/uL (ref 0.0–0.7)
Eosinophils Relative: 4.3 % (ref 0.0–5.0)
HCT: 41.8 % (ref 36.0–46.0)
Hemoglobin: 14 g/dL (ref 12.0–15.0)
Lymphocytes Relative: 20.5 % (ref 12.0–46.0)
Lymphs Abs: 2 10*3/uL (ref 0.7–4.0)
MCHC: 33.5 g/dL (ref 30.0–36.0)
MCV: 91 fl (ref 78.0–100.0)
Monocytes Absolute: 0.7 10*3/uL (ref 0.1–1.0)
Monocytes Relative: 7.1 % (ref 3.0–12.0)
Neutro Abs: 6.5 10*3/uL (ref 1.4–7.7)
Neutrophils Relative %: 67.9 % (ref 43.0–77.0)
Platelets: 238 10*3/uL (ref 150.0–400.0)
RBC: 4.6 Mil/uL (ref 3.87–5.11)
RDW: 13.4 % (ref 11.5–14.6)
WBC: 9.5 10*3/uL (ref 4.5–10.5)

## 2012-11-27 LAB — URINALYSIS, ROUTINE W REFLEX MICROSCOPIC
Bilirubin Urine: NEGATIVE
Hgb urine dipstick: NEGATIVE
Ketones, ur: NEGATIVE
Nitrite: NEGATIVE
RBC / HPF: NONE SEEN (ref 0–?)
Specific Gravity, Urine: 1.015 (ref 1.000–1.030)
Total Protein, Urine: NEGATIVE
Urine Glucose: NEGATIVE
Urobilinogen, UA: 0.2 (ref 0.0–1.0)
pH: 6.5 (ref 5.0–8.0)

## 2012-11-27 LAB — HEPATIC FUNCTION PANEL
ALT: 16 U/L (ref 0–35)
AST: 19 U/L (ref 0–37)
Albumin: 3.7 g/dL (ref 3.5–5.2)
Alkaline Phosphatase: 80 U/L (ref 39–117)
Bilirubin, Direct: 0.2 mg/dL (ref 0.0–0.3)
Total Bilirubin: 1.7 mg/dL — ABNORMAL HIGH (ref 0.3–1.2)
Total Protein: 7.9 g/dL (ref 6.0–8.3)

## 2012-11-27 LAB — LIPID PANEL
Cholesterol: 137 mg/dL (ref 0–200)
HDL: 38.2 mg/dL — ABNORMAL LOW (ref >39.00)
LDL Cholesterol: 88 mg/dL (ref 0–99)
Total CHOL/HDL Ratio: 4
Triglycerides: 56 mg/dL (ref 0.0–149.0)
VLDL: 11.2 mg/dL (ref 0.0–40.0)

## 2012-11-27 LAB — TSH: TSH: 3.57 u[IU]/mL (ref 0.35–5.50)

## 2012-11-27 MED ORDER — NAPROXEN 500 MG PO TABS: ORAL_TABLET | ORAL | Status: DC

## 2012-11-27 MED ORDER — IRBESARTAN-HYDROCHLOROTHIAZIDE 150-12.5 MG PO TABS: ORAL_TABLET | ORAL | Status: DC

## 2012-11-27 MED ORDER — TRAMADOL HCL 50 MG PO TABS: 50.0000 mg | ORAL_TABLET | Freq: Four times a day (QID) | ORAL | Status: DC | PRN

## 2012-11-27 MED ORDER — ASPIRIN 81 MG PO TBEC: 81.0000 mg | DELAYED_RELEASE_TABLET | Freq: Every day | ORAL | Status: DC

## 2012-11-27 NOTE — Assessment & Plan Note (Signed)

## 2012-11-27 NOTE — Assessment & Plan Note (Signed)
?   Tarsal tunnel - for podiatry referral

## 2012-11-27 NOTE — Patient Instructions (Addendum)
You had the shingles shot today Please take all new medication as prescribed - the pain medication Please also start Aspirin 81 mg - 1 per day - Enteric coated only, to help reduce risk of stroke and heart attack Please continue your efforts at being more active, low cholesterol diet, and weight control. You are otherwise up to date with prevention measures today. Please continue all other medications as before, and refills have been done You will be contacted regarding the referral for: podiatry (foot doctor)  Please go to the LAB in the Basement (turn left off the elevator) for the tests to be done today You will be contacted by phone if any changes need to be made immediately.  Otherwise, you will receive a letter about your results with an explanation  Please remember to sign up for My Chart if you have not done so, as this will be important to you in the future with finding out test results, communicating by private email, and scheduling acute appointments online when needed.  Please return in 6 months, or sooner if needed

## 2012-11-27 NOTE — Progress Notes (Signed)
Subjective:    Patient ID: Veronica [name], female    DOB: Nov 27, 1950, 62 y.o.   MRN: [id number]  HPI  Here for wellness and f/u;  Overall doing ok;  Pt denies CP, worsening SOB, DOE, wheezing, orthopnea, PND, worsening LE edema, palpitations, dizziness or syncope.  Pt denies neurological change such as new headache, facial or extremity weakness.  Pt denies polydipsia, polyuria, or low sugar symptoms. Pt states overall good compliance with treatment and medications, good tolerability, and has been trying to follow lower cholesterol diet.  Pt denies worsening depressive symptoms, suicidal ideation or panic. No fever, night sweats, wt loss, loss of appetite, or other constitutional symptoms.  Pt states good ability with ADL's, has low fall risk, home safety reviewed and adequate, no other significant changes in hearing or vision, and only occasionally active with exercise. Unfortunately also with right ankle medial swelling recently, limps to walk, mild to mod, nothing makes better, ongoing x 2 wks. Past Medical History  Diagnosis Date  . Broken ribs     hx of  . Hemorrhoids   . Allergy   . Arthritis   . GERD (gastroesophageal reflux disease)   . Hypertension   . Carpal tunnel syndrome 12/27/2010   Past Surgical History  Procedure Laterality Date  . Cholecystectomy  2000    reports that she has never smoked. She does not have any smokeless tobacco history on file. She reports that she does not drink alcohol or use illicit drugs. family history includes Alcohol abuse in her other; Arthritis in her other; Cancer in her other; Heart disease in her others; and Hypertension in her other. Allergies  Allergen Reactions  . Clarithromycin    Current Outpatient Prescriptions on File Prior to Visit  Medication Sig Dispense Refill  . irbesartan-hydrochlorothiazide (AVALIDE) 300-25 MG per tablet Take 1 tablet by mouth daily.  90 tablet  3   No current facility-administered medications on file prior  to visit.    Review of Systems Constitutional: Negative for diaphoresis, activity change, appetite change or unexpected weight change.  HENT: Negative for hearing loss, ear pain, facial swelling, mouth sores and neck stiffness.   Eyes: Negative for pain, redness and visual disturbance.  Respiratory: Negative for shortness of breath and wheezing.   Cardiovascular: Negative for chest pain and palpitations.  Gastrointestinal: Negative for diarrhea, blood in stool, abdominal distention or other pain Genitourinary: Negative for hematuria, flank pain or change in urine volume.  Musculoskeletal: Negative for myalgias and joint swelling.  Skin: Negative for color change and wound.  Neurological: Negative for syncope and numbness. other than noted Hematological: Negative for adenopathy.  Psychiatric/Behavioral: Negative for hallucinations, self-injury, decreased concentration and agitation.      Objective:   Physical Exam BP 110/82  Pulse 98  Temp(Src) 98 F (36.7 C) (Oral)  Ht 5\' 6"  (1.676 m)  Wt 296 lb 12 oz (134.605 kg)  BMI 47.92 kg/m2  SpO2 97% VS noted,  Constitutional: Pt is oriented to person, place, and time. Appears well-developed and well-nourished. /morbid obese Head: Normocephalic and atraumatic.  Right Ear: External ear normal.  Left Ear: External ear normal.  Nose: Nose normal.  Mouth/Throat: Oropharynx is clear and moist.  Eyes: Conjunctivae and EOM are normal. Pupils are equal, round, and reactive to light.  Neck: Normal range of motion. Neck supple. No JVD present. No tracheal deviation present.  Cardiovascular: Normal rate, regular rhythm, normal heart sounds and intact distal pulses.   Pulmonary/Chest: Effort normal and breath sounds  normal.  Abdominal: Soft. Bowel sounds are normal. There is no tenderness. No HSM  Musculoskeletal: Normal range of motion. Exhibits no edema. Right medial ankle with 1+ diffuse tender/sweling about the entire medal malleolus   Lymphadenopathy:  Has no cervical adenopathy.  Neurological: Pt is alert and oriented to person, place, and time. Pt has normal reflexes. No cranial nerve deficit.  Skin: Skin is warm and dry. No rash noted.  Psychiatric:  Has  normal mood and affect. Behavior is normal.     Assessment & Plan:

## 2013-04-22 ENCOUNTER — Other Ambulatory Visit: Payer: Self-pay

## 2013-06-04 ENCOUNTER — Encounter: Payer: Self-pay | Admitting: Internal Medicine

## 2013-06-04 ENCOUNTER — Other Ambulatory Visit (INDEPENDENT_AMBULATORY_CARE_PROVIDER_SITE_OTHER): Payer: BC Managed Care – PPO

## 2013-06-04 ENCOUNTER — Ambulatory Visit (INDEPENDENT_AMBULATORY_CARE_PROVIDER_SITE_OTHER): Payer: BC Managed Care – PPO | Admitting: Internal Medicine

## 2013-06-04 VITALS — BP 132/80 | HR 95 | Temp 97.2°F | Ht 66.0 in | Wt 295.2 lb

## 2013-06-04 DIAGNOSIS — Z23 Encounter for immunization: Secondary | ICD-10-CM

## 2013-06-04 DIAGNOSIS — G629 Polyneuropathy, unspecified: Secondary | ICD-10-CM

## 2013-06-04 DIAGNOSIS — M25562 Pain in left knee: Secondary | ICD-10-CM

## 2013-06-04 DIAGNOSIS — M545 Low back pain, unspecified: Secondary | ICD-10-CM

## 2013-06-04 DIAGNOSIS — I10 Essential (primary) hypertension: Secondary | ICD-10-CM

## 2013-06-04 DIAGNOSIS — M25561 Pain in right knee: Secondary | ICD-10-CM

## 2013-06-04 DIAGNOSIS — M25569 Pain in unspecified knee: Secondary | ICD-10-CM

## 2013-06-04 DIAGNOSIS — G609 Hereditary and idiopathic neuropathy, unspecified: Secondary | ICD-10-CM

## 2013-06-04 LAB — VITAMIN B12: Vitamin B-12: 484 pg/mL (ref 211–911)

## 2013-06-04 NOTE — Progress Notes (Signed)
Pre-visit discussion using our clinic review tool. No additional management support is needed unless otherwise documented below in the visit note.  

## 2013-06-04 NOTE — Assessment & Plan Note (Signed)
Clinical dx, declines emg or LS spine mri for now; for b12 level, no pain so o/w follow

## 2013-06-04 NOTE — Patient Instructions (Addendum)
You had the flu shot today Please continue all other medications as before, and refills have been done if requested. Please have the pharmacy call with any other refills you may need.  Please go to the LAB in the Basement (turn left off the elevator) for the tests to be done today You will be contacted by phone if any changes need to be made immediately.  Otherwise, you will receive a letter about your results with an explanation, but please check with MyChart first.  You will be contacted regarding the referral for: Dr Katrinka Blazing for the knees and back  Please return in 6 months, or sooner if needed

## 2013-06-04 NOTE — Assessment & Plan Note (Signed)
Right > left, and LBP - for referral Dr Katrinka Blazing

## 2013-06-04 NOTE — Progress Notes (Signed)
Subjective:    Patient ID: Veronica [name], female    DOB: 06-Nov-1950, 62 y.o.   MRN: [id number]  HPI Here to f/u, has seen podiatry with orthotics and foot pain improved, but now with several months numbness to both distal feet, right > left, without visible changes, no pain, swelling, redness, recent trauma. Had TSH June 2014 normal.  No B12 recent.  Also with recurring lbp that seems to change left and right, worse to walk and stand more than a 5-10 minutes; no recent falls. No overt radicular pain. Has some weakness to the legs but thinks may be due to knees not working right.  Also with recurring knee pain bilat, last saw ortho yrs ago, had 2 cortisone in the past that helped; no giveaways but walks with cane occasionally. Has chronic bilat swelling.  No hx of DM. Pt denies chest pain, increased sob or doe, wheezing, orthopnea, PND, increased LE swelling, palpitations, dizziness or syncope.  Also with worsening recent right knee pain with effusion, no falls or giveaways, fever. Past Medical History  Diagnosis Date  . Broken ribs     hx of  . Hemorrhoids   . Allergy   . Arthritis   . GERD (gastroesophageal reflux disease)   . Hypertension   . Carpal tunnel syndrome 12/27/2010   Past Surgical History  Procedure Laterality Date  . Cholecystectomy  2000    reports that she has never smoked. She does not have any smokeless tobacco history on file. She reports that she does not drink alcohol or use illicit drugs. family history includes Alcohol abuse in her other; Arthritis in her other; Cancer in her other; Heart disease in her other and other; Hypertension in her other. Allergies  Allergen Reactions  . Clarithromycin    Current Outpatient Prescriptions on File Prior to Visit  Medication Sig Dispense Refill  . aspirin 81 MG EC tablet Take 1 tablet (81 mg total) by mouth daily. Swallow whole.  30 tablet  12  . irbesartan-hydrochlorothiazide (AVALIDE) 150-12.5 MG per tablet take 2  tablet by mouth once daily  60 tablet  11  . naproxen (NAPROSYN) 500 MG tablet take 1 tablet by mouth twice a day with food  180 tablet  3  . traMADol (ULTRAM) 50 MG tablet Take 1 tablet (50 mg total) by mouth every 6 (six) hours as needed for pain.  120 tablet  2  . irbesartan-hydrochlorothiazide (AVALIDE) 300-25 MG per tablet Take 1 tablet by mouth daily.  90 tablet  3   No current facility-administered medications on file prior to visit.   Review of Systems  Constitutional: Negative for unexpected weight change, or unusual diaphoresis  HENT: Negative for tinnitus.   Eyes: Negative for photophobia and visual disturbance.  Respiratory: Negative for choking and stridor.   Gastrointestinal: Negative for vomiting and blood in stool.  Genitourinary: Negative for hematuria and decreased urine volume.  Musculoskeletal: Negative for acute joint swelling Skin: Negative for color change and wound.  Neurological: Negative for tremors and numbness other than noted  Psychiatric/Behavioral: Negative for decreased concentration or  hyperactivity.       Objective:   Physical Exam BP 132/80  Pulse 95  Temp(Src) 97.2 F (36.2 C) (Oral)  Ht 5\' 6"  (1.676 m)  Wt 295 lb 4 oz (133.925 kg)  BMI 47.68 kg/m2  SpO2 96% VS noted,  Constitutional: Pt appears well-developed and well-nourished.  HENT: Head: NCAT.  Right Ear: External ear normal.  Left Ear: External  ear normal.  Eyes: Conjunctivae and EOM are normal. Pupils are equal, round, and reactive to light.  Neck: Normal range of motion. Neck supple.  Cardiovascular: Normal rate and regular rhythm.   Pulmonary/Chest: Effort normal and breath sounds normal.  Neurological: Pt is alert. Not confused , motor 5/5, decreased sens to LT to bilat toes Right knee small effusion, NT. Decreased ROM Skin: Skin is warm. No erythema.  Psychiatric: Pt behavior is normal. Thought content normal.     Assessment & Plan:

## 2013-06-08 ENCOUNTER — Ambulatory Visit: Payer: BC Managed Care – PPO | Admitting: Family Medicine

## 2013-06-08 DIAGNOSIS — Z0289 Encounter for other administrative examinations: Secondary | ICD-10-CM

## 2013-06-09 NOTE — Assessment & Plan Note (Signed)
stable overall by history and exam, recent data reviewed with pt, and pt to continue medical treatment as before,  to f/u any worsening symptoms or concerns BP Readings from Last 3 Encounters:  06/04/13 132/80  11/27/12 110/82  09/05/11 132/80

## 2013-10-29 ENCOUNTER — Other Ambulatory Visit: Payer: Self-pay | Admitting: Internal Medicine

## 2013-11-02 NOTE — Telephone Encounter (Signed)
Done hardcopy to robin  

## 2013-11-02 NOTE — Telephone Encounter (Signed)
Faxed script back to [facility]...lmb 

## 2013-11-27 ENCOUNTER — Other Ambulatory Visit: Payer: Self-pay | Admitting: Internal Medicine

## 2013-11-30 ENCOUNTER — Other Ambulatory Visit: Payer: Self-pay | Admitting: Internal Medicine

## 2013-11-30 ENCOUNTER — Other Ambulatory Visit: Payer: Self-pay

## 2013-11-30 MED ORDER — IRBESARTAN-HYDROCHLOROTHIAZIDE 150-12.5 MG PO TABS: ORAL_TABLET | ORAL | Status: DC

## 2013-12-07 ENCOUNTER — Ambulatory Visit (INDEPENDENT_AMBULATORY_CARE_PROVIDER_SITE_OTHER): Payer: BC Managed Care – PPO | Admitting: Internal Medicine

## 2013-12-07 ENCOUNTER — Encounter: Payer: Self-pay | Admitting: Internal Medicine

## 2013-12-07 ENCOUNTER — Other Ambulatory Visit (INDEPENDENT_AMBULATORY_CARE_PROVIDER_SITE_OTHER): Payer: BC Managed Care – PPO

## 2013-12-07 VITALS — BP 114/84 | HR 97 | Temp 98.5°F | Ht 66.0 in | Wt 306.5 lb

## 2013-12-07 DIAGNOSIS — G609 Hereditary and idiopathic neuropathy, unspecified: Secondary | ICD-10-CM

## 2013-12-07 DIAGNOSIS — G629 Polyneuropathy, unspecified: Secondary | ICD-10-CM

## 2013-12-07 DIAGNOSIS — M25562 Pain in left knee: Secondary | ICD-10-CM

## 2013-12-07 DIAGNOSIS — Z0000 Encounter for general adult medical examination without abnormal findings: Secondary | ICD-10-CM

## 2013-12-07 DIAGNOSIS — M25561 Pain in right knee: Secondary | ICD-10-CM

## 2013-12-07 DIAGNOSIS — M25569 Pain in unspecified knee: Secondary | ICD-10-CM

## 2013-12-07 LAB — LIPID PANEL
Cholesterol: 146 mg/dL (ref 0–200)
HDL: 43.7 mg/dL (ref >39.00)
LDL Cholesterol: 91 mg/dL (ref 0–99)
NonHDL: 102.3
Total CHOL/HDL Ratio: 3
Triglycerides: 58 mg/dL (ref 0.0–149.0)
VLDL: 11.6 mg/dL (ref 0.0–40.0)

## 2013-12-07 LAB — URINALYSIS, ROUTINE W REFLEX MICROSCOPIC
Bilirubin Urine: NEGATIVE
Hgb urine dipstick: NEGATIVE
Ketones, ur: NEGATIVE
Leukocytes, UA: NEGATIVE
Nitrite: NEGATIVE
Specific Gravity, Urine: 1.01 (ref 1.000–1.030)
Total Protein, Urine: NEGATIVE
Urine Glucose: NEGATIVE
Urobilinogen, UA: 0.2 (ref 0.0–1.0)
WBC, UA: NONE SEEN — AB (ref 0–?)
pH: 6.5 (ref 5.0–8.0)

## 2013-12-07 LAB — BASIC METABOLIC PANEL
BUN: 17 mg/dL (ref 6–23)
CO2: 30 mEq/L (ref 19–32)
Calcium: 9.2 mg/dL (ref 8.4–10.5)
Chloride: 101 mEq/L (ref 96–112)
Creatinine, Ser: 0.9 mg/dL (ref 0.4–1.2)
GFR: 77.92 mL/min (ref >60.00)
Glucose, Bld: 105 mg/dL — ABNORMAL HIGH (ref 70–99)
Potassium: 4.3 mEq/L (ref 3.5–5.1)
Sodium: 138 mEq/L (ref 135–145)

## 2013-12-07 LAB — CBC WITH DIFFERENTIAL/PLATELET
Basophils Absolute: 0 10*3/uL (ref 0.0–0.1)
Basophils Relative: 0.4 % (ref 0.0–3.0)
Eosinophils Absolute: 0.3 10*3/uL (ref 0.0–0.7)
Eosinophils Relative: 2.8 % (ref 0.0–5.0)
HCT: 40 % (ref 36.0–46.0)
Hemoglobin: 13.2 g/dL (ref 12.0–15.0)
Lymphocytes Relative: 15.9 % (ref 12.0–46.0)
Lymphs Abs: 1.6 10*3/uL (ref 0.7–4.0)
MCHC: 32.9 g/dL (ref 30.0–36.0)
MCV: 87.9 fl (ref 78.0–100.0)
Monocytes Absolute: 0.6 10*3/uL (ref 0.1–1.0)
Monocytes Relative: 6.2 % (ref 3.0–12.0)
Neutro Abs: 7.7 10*3/uL (ref 1.4–7.7)
Neutrophils Relative %: 74.7 % (ref 43.0–77.0)
Platelets: 250 10*3/uL (ref 150.0–400.0)
RBC: 4.55 Mil/uL (ref 3.87–5.11)
RDW: 14.4 % (ref 11.5–15.5)
WBC: 10.3 10*3/uL (ref 4.0–10.5)

## 2013-12-07 LAB — HEPATIC FUNCTION PANEL
ALT: 16 U/L (ref 0–35)
AST: 19 U/L (ref 0–37)
Albumin: 3.8 g/dL (ref 3.5–5.2)
Alkaline Phosphatase: 85 U/L (ref 39–117)
Bilirubin, Direct: 0.2 mg/dL (ref 0.0–0.3)
Total Bilirubin: 1.5 mg/dL — ABNORMAL HIGH (ref 0.2–1.2)
Total Protein: 8 g/dL (ref 6.0–8.3)

## 2013-12-07 LAB — TSH: TSH: 4.81 u[IU]/mL — ABNORMAL HIGH (ref 0.35–4.50)

## 2013-12-07 MED ORDER — PANTOPRAZOLE SODIUM 40 MG PO TBEC: 40.0000 mg | DELAYED_RELEASE_TABLET | Freq: Every day | ORAL | Status: DC

## 2013-12-07 MED ORDER — GABAPENTIN 300 MG PO CAPS: 300.0000 mg | ORAL_CAPSULE | Freq: Every day | ORAL | Status: DC

## 2013-12-07 NOTE — Progress Notes (Signed)
Pre visit review using our clinic review tool, if applicable. No additional management support is needed unless otherwise documented below in the visit note. 

## 2013-12-07 NOTE — Assessment & Plan Note (Signed)

## 2013-12-07 NOTE — Progress Notes (Signed)
Subjective:    Patient ID: Veronica [name], female    DOB: October 10, 1950, 63 y.o.   MRN: [id number]  HPI    Here for wellness and f/u;  Overall doing ok;  Pt denies CP, worsening SOB, DOE, wheezing, orthopnea, PND, worsening LE edema, palpitations, dizziness or syncope.  Pt denies neurological change such as new headache, facial or extremity weakness.  Pt denies polydipsia, polyuria, or low sugar symptoms. Pt states overall good compliance with treatment and medications, good tolerability, and has been trying to follow lower cholesterol diet.  Pt denies worsening depressive symptoms, suicidal ideation or panic. No fever, night sweats, wt loss, loss of appetite, or other constitutional symptoms.  Pt states good ability with ADL's, has low fall risk, home safety reviewed and adequate, no other significant changes in hearing or vision, and only occasionally active with exercise, mostly due to knee pain, also ? neuropathy pain to feet, has seen Dr Regal/podiatry last yr, now with orthotics for tendonitis, not able to see Dr Tamala Julian since last  Visti, asks for referral.  Has had mild worsening reflux,but no abd pain, dysphagia, n/v, bowel change or blood. Past Medical History  Diagnosis Date  . Broken ribs     hx of  . Hemorrhoids   . Allergy   . Arthritis   . GERD (gastroesophageal reflux disease)   . Hypertension   . Carpal tunnel syndrome 12/27/2010   Past Surgical History  Procedure Laterality Date  . Cholecystectomy  2000    reports that she has never smoked. She does not have any smokeless tobacco history on file. She reports that she does not drink alcohol or use illicit drugs. family history includes Alcohol abuse in her other; Arthritis in her other; Cancer in her other; Heart disease in her other and other; Hypertension in her other. Allergies  Allergen Reactions  . Clarithromycin    Current Outpatient Prescriptions on File Prior to Visit  Medication Sig Dispense Refill  . aspirin 81  MG EC tablet Take 1 tablet (81 mg total) by mouth daily. Swallow whole.  30 tablet  12  . irbesartan-hydrochlorothiazide (AVALIDE) 150-12.5 MG per tablet TAKE 2 TABLETS BY MOUTH ONCE DAILY  60 tablet  6  . naproxen (NAPROSYN) 500 MG tablet take 1 tablet by mouth twice a day with food  180 tablet  3  . traMADol (ULTRAM) 50 MG tablet take 1 tablet by mouth every 6 hours if needed for pain  120 tablet  2   No current facility-administered medications on file prior to visit.   Review of Systems Constitutional: Negative for increased diaphoresis, other activity, appetite or other siginficant weight change  HENT: Negative for worsening hearing loss, ear pain, facial swelling, mouth sores and neck stiffness.   Eyes: Negative for other worsening pain, redness or visual disturbance.  Respiratory: Negative for shortness of breath and wheezing.   Cardiovascular: Negative for chest pain and palpitations.  Gastrointestinal: Negative for diarrhea, blood in stool, abdominal distention or other pain Genitourinary: Negative for hematuria, flank pain or change in urine volume.  Musculoskeletal: Negative for myalgias or other joint complaints.  Skin: Negative for color change and wound.  Neurological: Negative for syncope and numbness. other than noted Hematological: Negative for adenopathy. or other swelling Psychiatric/Behavioral: Negative for hallucinations, self-injury, decreased concentration or other worsening agitation.      Objective:   Physical Exam BP 114/84  Pulse 97  Temp(Src) 98.5 F (36.9 C) (Oral)  Ht 5\' 6"  (1.676 m)  Wt 306 lb 8 oz (139.027 kg)  BMI 49.49 kg/m2  SpO2 93% VS noted,  Constitutional: Pt is oriented to person, place, and time. Appears well-developed and well-nourished. /morbid obese Head: Normocephalic and atraumatic.  Right Ear: External ear normal.  Left Ear: External ear normal.  Nose: Nose normal.  Mouth/Throat: Oropharynx is clear and moist.  Eyes: Conjunctivae  and EOM are normal. Pupils are equal, round, and reactive to light.  Neck: Normal range of motion. Neck supple. No JVD present. No tracheal deviation present.  Cardiovascular: Normal rate, regular rhythm, normal heart sounds and intact distal pulses.   Pulmonary/Chest: Effort normal and breath sounds without rales or wheezing  Abdominal: Soft. Bowel sounds are normal. NT. No HSM  Musculoskeletal: Normal range of motion. Exhibits no edema.  Lymphadenopathy:  Has no cervical adenopathy.  Neurological: Pt is alert and oriented to person, place, and time. Pt has normal reflexes. No cranial nerve deficit. Motor grossly intact Skin: Skin is warm and dry. No rash noted.  Psychiatric:  Has normal mood and affect. Behavior is normal.     Assessment & Plan:

## 2013-12-07 NOTE — Assessment & Plan Note (Signed)
For sport med referral 

## 2013-12-07 NOTE — Patient Instructions (Signed)
Please take all new medication as prescribed - the protonix for reflux, and the gabapentin for pain  Please continue all other medications as before, and refills have been done if requested.  Please have the pharmacy call with any other refills you may need.  Please continue your efforts at being more active, low cholesterol diet, and weight control.  You are otherwise up to date with prevention measures today.  Please keep your appointments with your specialists as you may have planned  .[email or web address]  You will be contacted by phone if any changes need to be made immediately.  Otherwise, you will receive a letter about your results with an explanation, but please check with MyChart first.  You will be contacted regarding the referral for: Dr Tamala Julian  Please return in 1 year for your yearly visit, or sooner if needed

## 2013-12-07 NOTE — Assessment & Plan Note (Signed)
Ok for gabapentin 300 qhs,  to f/u any worsening symptoms or concerns 

## 2013-12-21 ENCOUNTER — Ambulatory Visit (INDEPENDENT_AMBULATORY_CARE_PROVIDER_SITE_OTHER): Payer: BC Managed Care – PPO | Admitting: Family Medicine

## 2013-12-21 ENCOUNTER — Encounter: Payer: Self-pay | Admitting: Family Medicine

## 2013-12-21 ENCOUNTER — Other Ambulatory Visit (INDEPENDENT_AMBULATORY_CARE_PROVIDER_SITE_OTHER): Payer: BC Managed Care – PPO

## 2013-12-21 VITALS — BP 132/80 | HR 99 | Ht 66.0 in | Wt 299.0 lb

## 2013-12-21 DIAGNOSIS — M25561 Pain in right knee: Secondary | ICD-10-CM

## 2013-12-21 DIAGNOSIS — M25562 Pain in left knee: Principal | ICD-10-CM

## 2013-12-21 DIAGNOSIS — M25569 Pain in unspecified knee: Secondary | ICD-10-CM

## 2013-12-21 DIAGNOSIS — M1710 Unilateral primary osteoarthritis, unspecified knee: Secondary | ICD-10-CM

## 2013-12-21 MED ORDER — DICLOFENAC SODIUM 2 % TD SOLN: TRANSDERMAL | Status: DC

## 2013-12-21 NOTE — Patient Instructions (Addendum)
Very nice to meet you Ice 20 minutes 2 times daily. Usually after activity and before bed. Exercises 3 times a week.  Try the pennsaid twice daily.  Stop the naproxen.  Take tylenol 650 mg three times a day is the best evidence based medicine we have for arthritis.  Glucosamine sulfate 750mg  twice a day is a supplement that has been shown to help moderate to severe arthritis. Vitamin D 2000 IU daily Fish oil 2 grams daily.  Tumeric 500mg  twice daily.  Capsaicin topically up to four times a day may also help with pain. Cortisone injections are an option if these interventions do not seem to make a difference or need more relief.  If cortisone injections do not help, there are different types of shots that may help but they take longer to take effect.  We can discuss this at follow up.  It's important that you continue to stay active. Controlling your weight is important.  Consider physical therapy to strengthen muscles around the joint that hurts to take pressure off of the joint itself. Tyr changes to the orthotics today.  Water aerobics and cycling with low resistance are the best two types of exercise for arthritis. Come back and see me in 3-4 weeks. Ask your insurance about having coverage for Synvisc or Orthovisc.

## 2013-12-21 NOTE — Progress Notes (Signed)
[facility] [place], [place] [address] Phone: [phone] Subjective:    I'm seeing this patient by the request  of:  Cathlean Cower, MD   CC: Bilateral Knee pain  [id number] Veronica [name] is a 63 y.o. female coming in with complaint of bilateral knee pain. Patient does have a past medical history significant for osteoarthritis and tricompartmental of the left knee back in 2004 from an MRI that I did review in her chart. On MRI the patient also had a prominent Baker's cyst as well as nondescript meniscal injury. Patient states she is having pain in both knees. Patient states most of it seems to be on the medial aspect of the knees. Worse with ambulation better with rest. Denies any significant radiation or any numbness. Patient has tried some over-the-counter medications without any significant improvement. Patient denies any giving out on her but does sometimes walk with the aid of a cane. Patient states that she does not go up stairs secondary to the pain. Patient change some of her daily activities.     Past medical history, social, surgical and family history all reviewed in electronic medical record.   Review of Systems: No headache, visual changes, nausea, vomiting, diarrhea, constipation, dizziness, abdominal pain, skin rash, fevers, chills, night sweats, weight loss, swollen lymph nodes, body aches, joint swelling, muscle aches, chest pain, shortness of breath, mood changes.   Objective Blood pressure 132/80, pulse 99, height 5\' 6"  (1.676 m), weight 299 lb (135.626 kg), SpO2 95.00%.  General: No apparent distress alert and oriented x3 mood and affect normal, dressed appropriately. Severe obesity HEENT: Pupils equal, extraocular movements intact  Respiratory: Patient's speak in full sentences and does not appear short of breath  Cardiovascular: No lower extremity edema, non tender, no erythema  Skin: Warm dry intact with no signs of  infection or rash on extremities or on axial skeleton.  Abdomen: Soft nontender  Neuro: Cranial nerves II through XII are intact, neurovascularly intact in all extremities with 2+ DTRs and 2+ pulses.  Lymph: No lymphadenopathy of posterior or anterior cervical chain or axillae bilaterally.  Gait normal with good balance and coordination.  MSK:  Non tender with full range of motion and good stability and symmetric strength and tone of shoulders, elbows, wrist, hip, and ankles bilaterally.  Knee: Bilateral Normal to inspection with no erythema or effusion or obvious bony abnormalities. Palpation reveals bilateral medial joint line tenderness. Patient also has significant lateral translation of the kneecaps bilaterally. ROM full in flexion and extension and lower leg rotation. Ligaments with solid consistent endpoints including ACL, PCL, LCL, MCL. Painful patellar compression. Patellar glide with severe crepitus. Patellar and quadriceps tendons unremarkable. Hamstring and quadriceps strength is normal.   MSK US performed of: Bilateral This study was ordered, performed, and interpreted by Charlann Boxer D.O.  Knee: All structures visualized. Difficult scan secondary to patient's body habitus. 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  Patellar Tendon unremarkable on long and transverse views without effusion. No abnormality of prepatellar bursa. LCL and MCL unremarkable on long and transverse views. No abnormality of origin of medial or lateral head of the gastrocnemius.  IMPRESSION: Bilateral severe medial joint line narrowing and osteophytic changes.  Procedure: Real-time Ultrasound Guided Injection of right knee Device: GE Logiq E  Ultrasound guided injection is preferred based studies that show increased duration, increased effect, greater accuracy, decreased procedural pain, increased response rate, and decreased cost with ultrasound guided versus blind injection.  Verbal informed  consent obtained.  Time-out conducted.  Noted no overlying erythema, induration, or other signs of local infection.  Skin prepped in a sterile fashion.  Local anesthesia: Topical Ethyl chloride.  With sterile technique and under real time ultrasound guidance: With a 22-gauge 2 inch needle patient was injected with 4 cc of 0.5% Marcaine and 1 cc of Kenalog 40 mg/dL. This was from a superior lateral approach.  Completed without difficulty  Pain immediately resolved suggesting accurate placement of the medication.  Advised to call if fevers/chills, erythema, induration, drainage, or persistent bleeding.  Images permanently stored and available for review in the ultrasound unit.  Impression: Technically successful ultrasound guided injection.   Procedure: Real-time Ultrasound Guided Injection of left knee Device: GE Logiq E  Ultrasound guided injection is preferred based studies that show increased duration, increased effect, greater accuracy, decreased procedural pain, increased response rate, and decreased cost with ultrasound guided versus blind injection.  Verbal informed consent obtained.  Time-out conducted.  Noted no overlying erythema, induration, or other signs of local infection.  Skin prepped in a sterile fashion.  Local anesthesia: Topical Ethyl chloride.  With sterile technique and under real time ultrasound guidance: With a 22-gauge 2 inch needle patient was injected with 4 cc of 0.5% Marcaine and 1 cc of Kenalog 40 mg/dL. This was from a superior lateral approach.  Completed without difficulty  Pain immediately resolved suggesting accurate placement of the medication.   Advised to call if fevers/chills, erythema, induration, drainage, or persistent bleeding.  Images permanently stored and available for review in the ultrasound unit.  Impression: Technically successful ultrasound guided injection.     Impression and Recommendations:     This case required medical decision  making of moderate complexity.

## 2013-12-21 NOTE — Assessment & Plan Note (Addendum)
Patient is going to have severe osteoarthritis of the knees bilaterally. We will get x-rays of the knees for further evaluation. Patient was given injection today with good resolution of pain. Because the patient's overlying lipomas I do not think bracing would be helpful for patient and would be more irritating. We discussed icing protocol, home exercise program and over-the-counter medicines and can be beneficial. Patient was given a topical anti-inflammatory to try as well. Patient is going to try these interventions and come back again in 3-4 weeks. Continuing to have pain she would be a candidate for further physical therapy or viscous supplementation. We'll discuss further at followup. Patient would not be a surgical candidate secondary to patient's weight.

## 2014-01-18 ENCOUNTER — Encounter: Payer: Self-pay | Admitting: Family Medicine

## 2014-01-18 ENCOUNTER — Ambulatory Visit (INDEPENDENT_AMBULATORY_CARE_PROVIDER_SITE_OTHER): Payer: BC Managed Care – PPO | Admitting: Family Medicine

## 2014-01-18 VITALS — BP 122/82 | HR 88 | Ht 66.0 in | Wt 296.0 lb

## 2014-01-18 DIAGNOSIS — M1710 Unilateral primary osteoarthritis, unspecified knee: Secondary | ICD-10-CM

## 2014-01-18 MED ORDER — DICLOFENAC SODIUM 2 % TD SOLN: TRANSDERMAL | Status: DC

## 2014-01-18 NOTE — Assessment & Plan Note (Signed)
Patient had bilateral Synvisc injections today. We discussed continuing the icing, home exercises, and continuing to wear good shoes. We discussed the possibility of formal physical therapy which she declined today. Patient has done physical therapy previously and has failed all conservative therapy making patient is a candidate for viscous supplementation. Patient will come back in one week for further evaluation as well as a repeat injection, #2 in a series of 3.

## 2014-01-18 NOTE — Patient Instructions (Signed)
Good to see you Veronica [name] is your friend.  Continue the pennsaid they will send you a refill.  Exercises 3 times a week.  See me again in 1 week and will do #2.  Call your insurance and tell them we are starting Synvisc injection because we failed all conservative therapy including steroid injections.

## 2014-01-18 NOTE — Progress Notes (Signed)
  [facility] [place], [place] [address] Phone: [phone] Subjective:     CC: Bilateral Knee pain  [id number] Veronica [name] is a 63 y.o. female coming in with complaint of bilateral knee pain. Patient does have a past medical history significant for osteoarthritis and tricompartmental of the left knee back in 2004.  Patient is not a surgical candidate secondary to her weight. Patient was seen previously and was given steroid injections in the knees bilaterally. Patient was given home exercise program, icing protocol we discussed over-the-counter medications that could be beneficial. Patient states she is 30% better. Topical anti-inflammatories have helped. Patient still states though that this pain keeps her from doing her daily activities trying to avoid stairs as well as a dull aching pain at night. Patient continues with the other medications as well with some mild improvement. No locking or giving on her. No swelling that she notes.      Past medical history, social, surgical and family history all reviewed in electronic medical record.   Review of Systems: No headache, visual changes, nausea, vomiting, diarrhea, constipation, dizziness, abdominal pain, skin rash, fevers, chills, night sweats, weight loss, swollen lymph nodes, body aches, joint swelling, muscle aches, chest pain, shortness of breath, mood changes.   Objective Blood pressure 122/82, pulse 88, height 5\' 6"  (1.676 m), weight 296 lb (134.265 kg), SpO2 97.00%.  General: No apparent distress alert and oriented x3 mood and affect normal, dressed appropriately. Severe obesity HEENT: Pupils equal, extraocular movements intact  Respiratory: Patient's speak in full sentences and does not appear short of breath  Cardiovascular: No lower extremity edema, non tender, no erythema  Skin: Warm dry intact with no signs of infection or rash on extremities or on axial skeleton.  Abdomen:  Soft nontender  Neuro: Cranial nerves II through XII are intact, neurovascularly intact in all extremities with 2+ DTRs and 2+ pulses.  Lymph: No lymphadenopathy of posterior or anterior cervical chain or axillae bilaterally.  Gait normal with good balance and coordination.  MSK:  Non tender with full range of motion and good stability and symmetric strength and tone of shoulders, elbows, wrist, hip, and ankles bilaterally.  Knee: Bilateral Normal to inspection with no erythema or effusion or obvious bony abnormalities. Palpation reveals bilateral medial joint line tenderness. Patient also has significant lateral translation of the kneecaps bilaterally. ROM full in flexion and extension and lower leg rotation. Ligaments with solid consistent endpoints including ACL, PCL, LCL, MCL. Painful patellar compression. Patellar glide with severe crepitus. Patellar and quadriceps tendons unremarkable. Hamstring and quadriceps strength is normal.   After informed written and verbal consent, patient was seated on exam table. Right knee was prepped with alcohol swab and utilizing anterolateral approach, patient's right knee space was injected with 16 mg/2.5 mL of Synvisc (sodium hyaluronate) in a prefilled syringe was injected easily into the knee through a 22-gauge needle.Patient tolerated the procedure well without immediate complications.  After informed written and verbal consent, patient was seated on exam table. Right knee was prepped with alcohol swab and utilizing anterolateral approach, patient's right knee space was injected with 16 mg/2.5 mL of Synvisc (sodium hyaluronate) in a prefilled syringe was injected easily into the knee through a 22-gauge needle.Patient tolerated the procedure well without immediate complications.    Impression and Recommendations:     This case required medical decision making of moderate complexity.

## 2014-01-25 ENCOUNTER — Encounter: Payer: Self-pay | Admitting: Family Medicine

## 2014-01-25 ENCOUNTER — Ambulatory Visit (INDEPENDENT_AMBULATORY_CARE_PROVIDER_SITE_OTHER): Payer: BC Managed Care – PPO | Admitting: Family Medicine

## 2014-01-25 VITALS — BP 110/80 | HR 88 | Ht 66.0 in | Wt 288.0 lb

## 2014-01-25 DIAGNOSIS — M1710 Unilateral primary osteoarthritis, unspecified knee: Secondary | ICD-10-CM

## 2014-01-25 NOTE — Patient Instructions (Signed)
Good to see you.  You are doing great Continue exercises, icing and bracing See you in 1 week.

## 2014-01-25 NOTE — Assessment & Plan Note (Signed)
Patient tolerated the procedure very well. Patient encouraged to continue the conservative therapy including home exercises, icing and bracing. Patient will followup in one week for the third and final injection.

## 2014-01-25 NOTE — Progress Notes (Signed)
  [name] [place], [place] [address] Phone: [phone] Subjective:     CC: Bilateral Knee pain  [id number] Veronica [name] is a 63 y.o. female coming in with complaint of bilateral knee pain. Patient does have a past medical history significant for osteoarthritis and tricompartmental of the left knee back in 2004.  Patient is not a surgical candidate secondary to her weight. Patient was seen previously and was given steroid injections in the knees bilaterally. Patient failed all other conservative therapy as well. Patient is here for her second in a series of 3 injections of Synvisc bilaterally. Patient states the last injection has made improvement. Patient states that she is approximately 20% better. Patient is actually noticing pain in different areas. Continues the topical anti-inflammatories as well.      Past medical history, social, surgical and family history all reviewed in electronic medical record.   Review of Systems: No headache, visual changes, nausea, vomiting, diarrhea, constipation, dizziness, abdominal pain, skin rash, fevers, chills, night sweats, weight loss, swollen lymph nodes, body aches, joint swelling, muscle aches, chest pain, shortness of breath, mood changes.   Objective There were no vitals taken for this visit.  General: No apparent distress alert and oriented x3 mood and affect normal, dressed appropriately. Severe obesity HEENT: Pupils equal, extraocular movements intact  Respiratory: Patient's speak in full sentences and does not appear short of breath  Cardiovascular: No lower extremity edema, non tender, no erythema  Skin: Warm dry intact with no signs of infection or rash on extremities or on axial skeleton.  Abdomen: Soft nontender  Neuro: Cranial nerves II through XII are intact, neurovascularly intact in all extremities with 2+ DTRs and 2+ pulses.  Lymph: No lymphadenopathy of posterior or anterior  cervical chain or axillae bilaterally.  Gait normal with good balance and coordination.  MSK:  Non tender with full range of motion and good stability and symmetric strength and tone of shoulders, elbows, wrist, hip, and ankles bilaterally.  Knee: Bilateral Normal to inspection with no erythema or effusion or obvious bony abnormalities. Palpation reveals bilateral medial joint line tenderness. Patient also has significant lateral translation of the kneecaps bilaterally. ROM full in flexion and extension and lower leg rotation. Ligaments with solid consistent endpoints including ACL, PCL, LCL, MCL. Painful patellar compression. Patellar glide with severe crepitus. Patellar and quadriceps tendons unremarkable. Hamstring and quadriceps strength is normal.   After informed written and verbal consent, patient was seated on exam table. Right knee was prepped with alcohol swab and utilizing anterolateral approach, patient's right knee space was injected with 16 mg/2.5 mL of Synvisc (sodium hyaluronate) in a prefilled syringe was injected easily into the knee through a 22-gauge needle.Patient tolerated the procedure well without immediate complications.  After informed written and verbal consent, patient was seated on exam table. Right knee was prepped with alcohol swab and utilizing anterolateral approach, patient's right knee space was injected with 16 mg/2.5 mL of Synvisc (sodium hyaluronate) in a prefilled syringe was injected easily into the knee through a 22-gauge needle.Patient tolerated the procedure well without immediate complications.    Impression and Recommendations:     This case required medical decision making of moderate complexity.

## 2014-02-02 ENCOUNTER — Ambulatory Visit (INDEPENDENT_AMBULATORY_CARE_PROVIDER_SITE_OTHER): Payer: BC Managed Care – PPO | Admitting: Family Medicine

## 2014-02-02 ENCOUNTER — Encounter: Payer: Self-pay | Admitting: Family Medicine

## 2014-02-02 VITALS — BP 124/82 | HR 91 | Ht 66.0 in | Wt 288.0 lb

## 2014-02-02 DIAGNOSIS — M1710 Unilateral primary osteoarthritis, unspecified knee: Secondary | ICD-10-CM

## 2014-02-02 NOTE — Assessment & Plan Note (Signed)
Patient was given third and final bilateral Synvisc injections today. We encourage her to continue to wear braces, continue the home exercises, icing protocol. We discussed her weight loss would be the best thing they can help her knees. Patient will continue to be as active as possible. Patient will come back in 4 weeks for further evaluation and treatment.

## 2014-02-02 NOTE — Patient Instructions (Signed)
It is good to see you Continue the exercises and staying active.  Losing weight will be the best thing you can do for yourself Ice is your friend I would like to see you again in 4 weeks.

## 2014-02-02 NOTE — Progress Notes (Signed)
  [facility] [place], [place] [address] Phone: [phone] Subjective:    CC: Bilateral Knee pain  [id number] Veronica [name] is a 63 y.o. female coming in with complaint of bilateral knee pain. Patient does have a past medical history significant for osteoarthritis and tricompartmental of the left knee back in 2004.  Patient is not a surgical candidate secondary to her weight. Patient was seen previously and was given steroid injections in the knees bilaterally. Patient failed all other conservative therapy as well. Patient is here for her third in a series of 3 injections of Synvisc bilaterally. Patient states the last injection has made improvement. Patient states that she is approximately 45% better. Patient is actually noticing pain in different areas. Continues the topical anti-inflammatories as well.      Past medical history, social, surgical and family history all reviewed in electronic medical record.   Review of Systems: No headache, visual changes, nausea, vomiting, diarrhea, constipation, dizziness, abdominal pain, skin rash, fevers, chills, night sweats, weight loss, swollen lymph nodes, body aches, joint swelling, muscle aches, chest pain, shortness of breath, mood changes.   Objective Blood pressure 124/82, pulse 91, height 5\' 6"  (1.676 m), weight 288 lb (130.636 kg), SpO2 95.00%.  General: No apparent distress alert and oriented x3 mood and affect normal, dressed appropriately. Severe obesity HEENT: Pupils equal, extraocular movements intact  Respiratory: Patient's speak in full sentences and does not appear short of breath  Cardiovascular: No lower extremity edema, non tender, no erythema  Skin: Warm dry intact with no signs of infection or rash on extremities or on axial skeleton.  Abdomen: Soft nontender  Neuro: Cranial nerves II through XII are intact, neurovascularly intact in all extremities with 2+ DTRs and 2+ pulses.    Lymph: No lymphadenopathy of posterior or anterior cervical chain or axillae bilaterally.  Gait normal with good balance and coordination.  MSK:  Non tender with full range of motion and good stability and symmetric strength and tone of shoulders, elbows, wrist, hip, and ankles bilaterally.  Knee: Bilateral Normal to inspection with no erythema or effusion or obvious bony abnormalities. Palpation reveals bilateral medial joint line tenderness. Patient also has significant lateral translation of the kneecaps bilaterally. ROM full in flexion and extension and lower leg rotation. Ligaments with solid consistent endpoints including ACL, PCL, LCL, MCL. Painful patellar compression. Patellar glide with severe crepitus. Patellar and quadriceps tendons unremarkable. Hamstring and quadriceps strength is normal.   After informed written and verbal consent, patient was seated on exam table. Right knee was prepped with alcohol swab and utilizing anterolateral approach, patient's right knee space was injected with 16 mg/2.5 mL of Synvisc (sodium hyaluronate) in a prefilled syringe was injected easily into the knee through a 22-gauge needle.Patient tolerated the procedure well without immediate complications.  After informed written and verbal consent, patient was seated on exam table. Right knee was prepped with alcohol swab and utilizing anterolateral approach, patient's right knee space was injected with 16 mg/2.5 mL of Synvisc (sodium hyaluronate) in a prefilled syringe was injected easily into the knee through a 22-gauge needle.Patient tolerated the procedure well without immediate complications.    Impression and Recommendations:     This case required medical decision making of moderate complexity.

## 2014-03-02 ENCOUNTER — Ambulatory Visit: Payer: BC Managed Care – PPO | Admitting: Family Medicine

## 2014-03-07 ENCOUNTER — Other Ambulatory Visit: Payer: Self-pay | Admitting: Internal Medicine

## 2014-03-10 ENCOUNTER — Other Ambulatory Visit: Payer: Self-pay | Admitting: Internal Medicine

## 2014-03-11 ENCOUNTER — Encounter: Payer: Self-pay | Admitting: Family Medicine

## 2014-03-11 ENCOUNTER — Ambulatory Visit (INDEPENDENT_AMBULATORY_CARE_PROVIDER_SITE_OTHER)
Admission: RE | Admit: 2014-03-11 | Discharge: 2014-03-11 | Disposition: A | Discharge status: Home / Self Care | Payer: BC Managed Care – PPO | Source: Ambulatory Visit | Attending: Family Medicine | Admitting: Family Medicine

## 2014-03-11 ENCOUNTER — Ambulatory Visit (INDEPENDENT_AMBULATORY_CARE_PROVIDER_SITE_OTHER): Payer: BC Managed Care – PPO | Admitting: Family Medicine

## 2014-03-11 VITALS — BP 136/86 | HR 89 | Ht 66.0 in | Wt 304.0 lb

## 2014-03-11 DIAGNOSIS — M25569 Pain in unspecified knee: Secondary | ICD-10-CM

## 2014-03-11 DIAGNOSIS — M25579 Pain in unspecified ankle and joints of unspecified foot: Secondary | ICD-10-CM

## 2014-03-11 DIAGNOSIS — M25571 Pain in right ankle and joints of right foot: Secondary | ICD-10-CM

## 2014-03-11 DIAGNOSIS — M1710 Unilateral primary osteoarthritis, unspecified knee: Secondary | ICD-10-CM

## 2014-03-11 DIAGNOSIS — M25562 Pain in left knee: Principal | ICD-10-CM

## 2014-03-11 DIAGNOSIS — M25561 Pain in right knee: Secondary | ICD-10-CM

## 2014-03-11 MED ORDER — DICLOFENAC SODIUM 2 % TD SOLN: TRANSDERMAL | Status: DC

## 2014-03-11 NOTE — Patient Instructions (Signed)
It is good to see you  Ice is your friend Try the new braces. One for the knee and one for the ankle Pennsaid twice daily.  Continue the exercises Lets get xrays downstairs today.  Lets give you 3-4 weeks.

## 2014-03-11 NOTE — Assessment & Plan Note (Signed)
Pes planus bilaterally. Patient was given a pneumatic arch supports and I think will be helpful in some alignment on the right side. We'll discussed at followup if she needs on the left side. We discussed continuing the icing and home exercises to help with the strengthening of the arch. Patient will continue the custom orthotics that she had from another provider. Patient will come back and see me again in 3-4 weeks for further evaluation and treatment.

## 2014-03-11 NOTE — Assessment & Plan Note (Signed)
Patient did not have any significant improvement with the Synvisc injections. Patient was given a brace today and thus fitted by me on her right knee. We also discussed different exercises are to be beneficial. We encouraged weight loss and we then discussed the possibility of referral to gastric bypass surgery. Patient states that she will consider this. We also discussed continuing the icing regimen and then topical anti-inflammatories and a prescription was given. Patient and will come back and see me again in 3-4 weeks. At that point if she continues to have pain at this restart that corticosteroid injection as well as consider bracing the other side.

## 2014-03-11 NOTE — Progress Notes (Signed)
  [facility] [place], [place] [address] Phone: [phone] Subjective:    CC: Bilateral Knee pain  [id number] Veronica [name] is a 63 y.o. female coming in with complaint of bilateral knee pain. Patient does have a past medical history significant for osteoarthritis and tricompartmental of the left knee back in 2004.  Patient is not a surgical candidate secondary to her weight. Patient never went to get new x-rays. They are still pending. Patient was seen previously and was given steroid injections in the knees bilaterally. Patient is actually finished her Synvisc injections one month ago. Patient states initially for the first couple weeks she seems to be doing better but now back to her regular baseline. And has significant pain when she is walking. Patient states that she is still able to do daily activities but is significantly sore at the end of night. Patient states that the topical medication was helping. Denies any new symptoms has worsening of previous symptoms.      Past medical history, social, surgical and family history all reviewed in electronic medical record.   Review of Systems: No headache, visual changes, nausea, vomiting, diarrhea, constipation, dizziness, abdominal pain, skin rash, fevers, chills, night sweats, weight loss, swollen lymph nodes, body aches, joint swelling, muscle aches, chest pain, shortness of breath, mood changes.   Objective Blood pressure 136/86, pulse 89, height 5\' 6"  (1.676 m), weight 304 lb (137.893 kg), SpO2 96.00%.  General: No apparent distress alert and oriented x3 mood and affect normal, dressed appropriately. Severe obesity HEENT: Pupils equal, extraocular movements intact  Respiratory: Patient's speak in full sentences and does not appear short of breath  Cardiovascular: No lower extremity edema, non tender, no erythema  Skin: Warm dry intact with no signs of infection or rash on extremities or  on axial skeleton.  Abdomen: Soft nontender  Neuro: Cranial nerves II through XII are intact, neurovascularly intact in all extremities with 2+ DTRs and 2+ pulses.  Lymph: No lymphadenopathy of posterior or anterior cervical chain or axillae bilaterally.  Gait normal with good balance and coordination.  MSK:  Non tender with full range of motion and good stability and symmetric strength and tone of shoulders, elbows, wrist, hip, and ankles bilaterally.  Knee: Bilateral Normal to inspection with no erythema or effusion or obvious bony abnormalities. Palpation reveals bilateral medial joint line tenderness no significant change from previous exam. Patient also has significant lateral translation of the kneecaps bilaterally. ROM full in flexion and extension and lower leg rotation. Ligaments with solid consistent endpoints including ACL, PCL, LCL, MCL. Painful patellar compression. Patellar glide with severe crepitus. Patellar and quadriceps tendons unremarkable. Hamstring and quadriceps strength is normal.   Foot exam shows the patient does have severe pes planus bilaterally and does have custom orthotics already.   Impression and Recommendations:     This case required medical decision making of moderate complexity.

## 2014-04-18 ENCOUNTER — Other Ambulatory Visit: Payer: Self-pay | Admitting: Internal Medicine

## 2014-04-21 ENCOUNTER — Other Ambulatory Visit: Payer: Self-pay

## 2014-04-21 DIAGNOSIS — Z1231 Encounter for screening mammogram for malignant neoplasm of breast: Secondary | ICD-10-CM

## 2014-05-10 ENCOUNTER — Ambulatory Visit
Admission: RE | Admit: 2014-05-10 | Discharge: 2014-05-10 | Disposition: A | Discharge status: Home / Self Care | Payer: BC Managed Care – PPO | Source: Ambulatory Visit

## 2014-05-10 DIAGNOSIS — Z1231 Encounter for screening mammogram for malignant neoplasm of breast: Secondary | ICD-10-CM

## 2014-05-10 LAB — LAB REPORT - SCANNED: Pap: NEGATIVE

## 2014-09-01 ENCOUNTER — Telehealth: Payer: Self-pay | Admitting: Internal Medicine

## 2014-09-02 ENCOUNTER — Telehealth: Payer: Self-pay

## 2014-09-02 NOTE — Telephone Encounter (Signed)
Left message for pt to call back to schedule if she still wants flu vaccine

## 2014-10-20 ENCOUNTER — Encounter: Payer: Self-pay | Admitting: Internal Medicine

## 2014-12-13 ENCOUNTER — Encounter: Payer: Self-pay | Admitting: Internal Medicine

## 2014-12-13 ENCOUNTER — Ambulatory Visit (INDEPENDENT_AMBULATORY_CARE_PROVIDER_SITE_OTHER): Payer: BC Managed Care – PPO | Admitting: Internal Medicine

## 2014-12-13 ENCOUNTER — Other Ambulatory Visit (INDEPENDENT_AMBULATORY_CARE_PROVIDER_SITE_OTHER): Payer: BC Managed Care – PPO

## 2014-12-13 VITALS — BP 130/82 | HR 89 | Temp 98.5°F | Ht 66.0 in | Wt 277.0 lb

## 2014-12-13 DIAGNOSIS — I10 Essential (primary) hypertension: Secondary | ICD-10-CM

## 2014-12-13 DIAGNOSIS — Z23 Encounter for immunization: Secondary | ICD-10-CM | POA: Diagnosis not present

## 2014-12-13 DIAGNOSIS — M25561 Pain in right knee: Secondary | ICD-10-CM

## 2014-12-13 DIAGNOSIS — Z0000 Encounter for general adult medical examination without abnormal findings: Secondary | ICD-10-CM

## 2014-12-13 DIAGNOSIS — M25562 Pain in left knee: Secondary | ICD-10-CM

## 2014-12-13 LAB — BASIC METABOLIC PANEL
BUN: 15 mg/dL (ref 6–23)
CO2: 31 mEq/L (ref 19–32)
Calcium: 9.1 mg/dL (ref 8.4–10.5)
Chloride: 104 mEq/L (ref 96–112)
Creatinine, Ser: 0.79 mg/dL (ref 0.40–1.20)
GFR: 93.76 mL/min (ref >60.00)
Glucose, Bld: 91 mg/dL (ref 70–99)
Potassium: 4.4 mEq/L (ref 3.5–5.1)
Sodium: 141 mEq/L (ref 135–145)

## 2014-12-13 LAB — CBC WITH DIFFERENTIAL/PLATELET
Basophils Absolute: 0 10*3/uL (ref 0.0–0.1)
Basophils Relative: 0.3 % (ref 0.0–3.0)
Eosinophils Absolute: 0.2 10*3/uL (ref 0.0–0.7)
Eosinophils Relative: 2 % (ref 0.0–5.0)
HCT: 39 % (ref 36.0–46.0)
Hemoglobin: 12.9 g/dL (ref 12.0–15.0)
Lymphocytes Relative: 22 % (ref 12.0–46.0)
Lymphs Abs: 1.8 10*3/uL (ref 0.7–4.0)
MCHC: 33 g/dL (ref 30.0–36.0)
MCV: 88.9 fl (ref 78.0–100.0)
Monocytes Absolute: 0.6 10*3/uL (ref 0.1–1.0)
Monocytes Relative: 6.9 % (ref 3.0–12.0)
Neutro Abs: 5.5 10*3/uL (ref 1.4–7.7)
Neutrophils Relative %: 68.8 % (ref 43.0–77.0)
Platelets: 233 10*3/uL (ref 150.0–400.0)
RBC: 4.39 Mil/uL (ref 3.87–5.11)
RDW: 14.6 % (ref 11.5–15.5)
WBC: 8 10*3/uL (ref 4.0–10.5)

## 2014-12-13 LAB — HEPATIC FUNCTION PANEL
ALT: 13 U/L (ref 0–35)
AST: 15 U/L (ref 0–37)
Albumin: 3.6 g/dL (ref 3.5–5.2)
Alkaline Phosphatase: 80 U/L (ref 39–117)
Bilirubin, Direct: 0.1 mg/dL (ref 0.0–0.3)
Total Bilirubin: 1.3 mg/dL — ABNORMAL HIGH (ref 0.2–1.2)
Total Protein: 7.6 g/dL (ref 6.0–8.3)

## 2014-12-13 LAB — URINALYSIS, ROUTINE W REFLEX MICROSCOPIC
Bilirubin Urine: NEGATIVE
Hgb urine dipstick: NEGATIVE
Ketones, ur: NEGATIVE
Leukocytes, UA: NEGATIVE
Nitrite: NEGATIVE
RBC / HPF: NONE SEEN (ref 0–?)
Specific Gravity, Urine: 1.01 (ref 1.000–1.030)
Total Protein, Urine: NEGATIVE
Urine Glucose: NEGATIVE
Urobilinogen, UA: 0.2 (ref 0.0–1.0)
WBC, UA: NONE SEEN (ref 0–?)
pH: 7 (ref 5.0–8.0)

## 2014-12-13 LAB — LIPID PANEL
Cholesterol: 136 mg/dL (ref 0–200)
HDL: 44.3 mg/dL (ref >39.00)
LDL Cholesterol: 81 mg/dL (ref 0–99)
NonHDL: 91.7
Total CHOL/HDL Ratio: 3
Triglycerides: 55 mg/dL (ref 0.0–149.0)
VLDL: 11 mg/dL (ref 0.0–40.0)

## 2014-12-13 LAB — TSH: TSH: 3.05 u[IU]/mL (ref 0.35–4.50)

## 2014-12-13 MED ORDER — TRAMADOL HCL 50 MG PO TABS: ORAL_TABLET | ORAL | Status: DC

## 2014-12-13 MED ORDER — PNEUMOCOCCAL 13-VAL CONJ VACC IM SUSP
0.5000 mL | Freq: Once | INTRAMUSCULAR | Status: AC
  Administered 2014-12-13: 0.5 mL via INTRAMUSCULAR

## 2014-12-13 NOTE — Progress Notes (Signed)
Subjective:    Patient ID: Veronica [name], female    DOB: 07-24-1950, 64 y.o.   MRN: [id number]  HPI Here for wellness and f/u;  Overall doing ok;  Pt denies Chest pain, worsening SOB, DOE, wheezing, orthopnea, PND, worsening LE edema, palpitations, dizziness or syncope.  Pt denies neurological change such as new headache, facial or extremity weakness.  Pt denies polydipsia, polyuria, or low sugar symptoms. Pt states overall good compliance with treatment and medications, good tolerability, and has been trying to follow appropriate diet.  Pt denies worsening depressive symptoms, suicidal ideation or panic. No fever, night sweats, wt loss, loss of appetite, or other constitutional symptoms.  Pt states good ability with ADL's, has low fall risk, home safety reviewed and adequate, no other significant changes in hearing or vision, and only occasionally active with exercise, in fact need handicap parking and tramadol refill due to knee pain now severe enough for tramadol at least once per day.  Has been seeing sports med, may need TKR bilat eventually Past Medical History  Diagnosis Date  . Broken ribs     hx of  . Hemorrhoids   . Allergy   . Arthritis   . GERD (gastroesophageal reflux disease)   . Hypertension   . Carpal tunnel syndrome 12/27/2010   Past Surgical History  Procedure Laterality Date  . Cholecystectomy  2000    reports that she has never smoked. She does not have any smokeless tobacco history on file. She reports that she does not drink alcohol or use illicit drugs. family history includes Alcohol abuse in her other; Arthritis in her other; Cancer in her other; Heart disease in her other and other; Hypertension in her other. Allergies  Allergen Reactions  . Clarithromycin    Current Outpatient Prescriptions on File Prior to Visit  Medication Sig Dispense Refill  . aspirin 81 MG EC tablet Take 1 tablet (81 mg total) by mouth daily. Swallow whole. 30 tablet 12  . Diclofenac  Sodium 2 % SOLN Apply twice daily (Patient not taking: Reported on 12/13/2014) 112 g 33  . gabapentin (NEURONTIN) 300 MG capsule Take 1 capsule (300 mg total) by mouth at bedtime. (Patient not taking: Reported on 12/13/2014) 90 capsule 3  . irbesartan-hydrochlorothiazide (AVALIDE) 150-12.5 MG per tablet TAKE 2 TABLETS BY MOUTH ONCE DAILY (Patient not taking: Reported on 12/13/2014) 60 tablet 6  . naproxen (NAPROSYN) 500 MG tablet take 1 tablet by mouth twice a day with food (Patient not taking: Reported on 12/13/2014) 180 tablet 3  . pantoprazole (PROTONIX) 40 MG tablet Take 1 tablet (40 mg total) by mouth daily. (Patient not taking: Reported on 12/13/2014) 90 tablet 3   No current facility-administered medications on file prior to visit.    Review of Systems Constitutional: Negative for increased diaphoresis, other activity, appetite or siginficant weight change other than noted HENT: Negative for worsening hearing loss, ear pain, facial swelling, mouth sores and neck stiffness.   Eyes: Negative for other worsening pain, redness or visual disturbance.  Respiratory: Negative for shortness of breath and wheezing  Cardiovascular: Negative for chest pain and palpitations.  Gastrointestinal: Negative for diarrhea, blood in stool, abdominal distention or other pain Genitourinary: Negative for hematuria, flank pain or change in urine volume.  Musculoskeletal: Negative for myalgias or other joint complaints.  Skin: Negative for color change and wound or drainage.  Neurological: Negative for syncope and numbness. other than noted Hematological: Negative for adenopathy. or other swelling Psychiatric/Behavioral: Negative for hallucinations,  SI, self-injury, decreased concentration or other worsening agitation.      Objective:   Physical Exam BP 130/82 mmHg  Pulse 89  Temp(Src) 98.5 F (36.9 C) (Oral)  Ht 5\' 6"  (1.676 m)  Wt 277 lb (125.646 kg)  BMI 44.73 kg/m2  SpO2 98% VS noted,  Constitutional:  Pt is oriented to person, place, and time. Appears well-developed and well-nourished, in no significant distress Head: Normocephalic and atraumatic.  Right Ear: External ear normal.  Left Ear: External ear normal.  Nose: Nose normal.  Mouth/Throat: Oropharynx is clear and moist.  Eyes: Conjunctivae and EOM are normal. Pupils are equal, round, and reactive to light.  Neck: Normal range of motion. Neck supple. No JVD present. No tracheal deviation present or significant neck LA or mass Cardiovascular: Normal rate, regular rhythm, normal heart sounds and intact distal pulses.   Pulmonary/Chest: Effort normal and breath sounds without rales or wheezing  Abdominal: Soft. Bowel sounds are normal. NT. No HSM  Musculoskeletal: Normal range of motion. Exhibits no edema.  Lymphadenopathy:  Has no cervical adenopathy.  Neurological: Pt is alert and oriented to person, place, and time. Pt has normal reflexes. No cranial nerve deficit. Motor grossly intact Skin: Skin is warm and dry. No rash noted.  Psychiatric:  Has normal mood and affect. Behavior is normal.  BIlat knee crepitus, no effusion, no warmth or tender    Assessment & Plan:

## 2014-12-13 NOTE — Progress Notes (Signed)
Pre visit review using our clinic review tool, if applicable. No additional management support is needed unless otherwise documented below in the visit note. 

## 2014-12-13 NOTE — Patient Instructions (Addendum)
You had the new Prevnar pneumonia shot today  Please continue all other medications as before, and refills have been done if requested - the tramadol  Please have the pharmacy call with any other refills you may need.  Please continue your efforts at being more active, low cholesterol diet, and weight control.  You are otherwise up to date with prevention measures today.  Please keep your appointments with your specialists as you may have planned  Please go to the LAB in the Basement (turn left off the elevator) for the tests to be done today  You will be contacted by phone if any changes need to be made immediately.  Otherwise, you will receive a letter about your results with an explanation, but please check with MyChart first.  Please remember to sign up for MyChart if you have not done so, as this will be important to you in the future with finding out test results, communicating by private email, and scheduling acute appointments online when needed.  Please return in 1 year for your yearly visit, or sooner if needed, with Lab testing done 3-5 days before      

## 2014-12-13 NOTE — Assessment & Plan Note (Signed)
stable overall by history and exam, recent data reviewed with pt, and pt to continue medical treatment as before,  to f/u any worsening symptoms or concerns BP Readings from Last 3 Encounters:  12/13/14 130/82  03/11/14 136/86  02/02/14 124/82

## 2014-12-13 NOTE — Addendum Note (Signed)
Addended by: Lyman Bishop on: 12/13/2014 09:27 AM   Modules accepted: Orders

## 2014-12-13 NOTE — Assessment & Plan Note (Signed)
With djd, for tramadol refill,  to f/u any worsening symptoms or concerns

## 2014-12-13 NOTE — Assessment & Plan Note (Signed)

## 2014-12-23 ENCOUNTER — Other Ambulatory Visit: Payer: Self-pay | Admitting: Internal Medicine

## 2014-12-23 NOTE — Telephone Encounter (Signed)
Done Done hardcopy to Dahlia  

## 2014-12-23 NOTE — Telephone Encounter (Signed)
Please advise, thanks.

## 2014-12-26 NOTE — Telephone Encounter (Signed)
Rx faxed to pharmacy  

## 2015-03-17 ENCOUNTER — Other Ambulatory Visit (INDEPENDENT_AMBULATORY_CARE_PROVIDER_SITE_OTHER): Payer: BC Managed Care – PPO

## 2015-03-17 ENCOUNTER — Ambulatory Visit (INDEPENDENT_AMBULATORY_CARE_PROVIDER_SITE_OTHER): Payer: BC Managed Care – PPO | Admitting: Internal Medicine

## 2015-03-17 ENCOUNTER — Encounter: Payer: Self-pay | Admitting: Internal Medicine

## 2015-03-17 VITALS — BP 124/80 | HR 106 | Temp 98.9°F | Ht 66.0 in | Wt 268.0 lb

## 2015-03-17 DIAGNOSIS — R350 Frequency of micturition: Secondary | ICD-10-CM | POA: Insufficient documentation

## 2015-03-17 DIAGNOSIS — I10 Essential (primary) hypertension: Secondary | ICD-10-CM

## 2015-03-17 LAB — URINALYSIS, ROUTINE W REFLEX MICROSCOPIC
Ketones, ur: 15 — AB
Leukocytes, UA: NEGATIVE
Nitrite: NEGATIVE
Specific Gravity, Urine: 1.02 (ref 1.000–1.030)
Total Protein, Urine: 30 — AB
Urine Glucose: NEGATIVE
Urobilinogen, UA: 1 (ref 0.0–1.0)
WBC, UA: NONE SEEN (ref 0–?)
pH: 6.5 (ref 5.0–8.0)

## 2015-03-17 MED ORDER — CEPHALEXIN 500 MG PO CAPS: 500.0000 mg | ORAL_CAPSULE | Freq: Four times a day (QID) | ORAL | Status: DC

## 2015-03-17 NOTE — Progress Notes (Signed)
Pre visit review using our clinic review tool, if applicable. No additional management support is needed unless otherwise documented below in the visit note. 

## 2015-03-17 NOTE — Progress Notes (Addendum)
Subjective:    Patient ID: Veronica [name], female    DOB: 11-21-1950, 64 y.o.   MRN: [id number]  HPI  Here to f/u, c/o lower abd pain x 3 days, assoc with some urgency, freq and leakage, and several chills, but Denies urinary symptoms such as dysuria, flank pain, hematuria or fever. Has had some nausea, no vomiting. Tried a dulcolox  But seemed to only lead to loose stool. Pt denies chest pain, increased sob or doe, wheezing, orthopnea, PND, increased LE swelling, palpitations, dizziness or syncope.  Cant recall last UTI. Pt denies new neurological symptoms such as new headache, or facial or extremity weakness or numbness   Pt denies polydipsia, polyuria Past Medical History  Diagnosis Date  . Broken ribs     hx of  . Hemorrhoids   . Allergy   . Arthritis   . GERD (gastroesophageal reflux disease)   . Hypertension   . Carpal tunnel syndrome 12/27/2010   Past Surgical History  Procedure Laterality Date  . Cholecystectomy  2000    reports that she has never smoked. She does not have any smokeless tobacco history on file. She reports that she does not drink alcohol or use illicit drugs. family history includes Alcohol abuse in her other; Arthritis in her other; Cancer in her other; Heart disease in her other and other; Hypertension in her other. Allergies  Allergen Reactions  . Clarithromycin    Current Outpatient Prescriptions on File Prior to Visit  Medication Sig Dispense Refill  . acetaminophen (TYLENOL) 650 MG CR tablet Take 650 mg by mouth every 8 (eight) hours as needed for pain.    Marland Kitchen aspirin 81 MG EC tablet Take 1 tablet (81 mg total) by mouth daily. Swallow whole. 30 tablet 12  . irbesartan-hydrochlorothiazide (AVALIDE) 150-12.5 MG per tablet TAKE 2 TABLETS BY MOUTH ONCE DAILY 60 tablet 6  . traMADol (ULTRAM) 50 MG tablet take 1 tablet by mouth every 6 hours if needed for pain 120 tablet 2  . Diclofenac Sodium 2 % SOLN Apply twice daily (Patient not taking: Reported on  12/13/2014) 112 g 33  . gabapentin (NEURONTIN) 300 MG capsule Take 1 capsule (300 mg total) by mouth at bedtime. (Patient not taking: Reported on 12/13/2014) 90 capsule 3  . naproxen (NAPROSYN) 500 MG tablet take 1 tablet by mouth twice a day with food (Patient not taking: Reported on 12/13/2014) 180 tablet 3  . pantoprazole (PROTONIX) 40 MG tablet Take 1 tablet (40 mg total) by mouth daily. (Patient not taking: Reported on 12/13/2014) 90 tablet 3   No current facility-administered medications on file prior to visit.    Review of Systems  All otherwise neg per pt     Objective:   Physical Exam BP 124/80 mmHg  Pulse 106  Temp(Src) 98.9 F (37.2 C) (Oral)  Ht 5\' 6"  (1.676 m)  Wt 268 lb (121.564 kg)  BMI 43.28 kg/m2  SpO2 96% VS noted,  Constitutional: Pt appears in no significant distress HENT: Head: NCAT.  Right Ear: External ear normal.  Left Ear: External ear normal.  Eyes: . Pupils are equal, round, and reactive to light. Conjunctivae and EOM are normal Neck: Normal range of motion. Neck supple.  Cardiovascular: Normal rate and regular rhythm.   Pulmonary/Chest: Effort normal and breath sounds without rales or wheezing.  Abd:  Soft,  ND, + BS with low mid abd tender, no guarding or rebound, no flank tender Neurological: Pt is alert. Not confused , motor  grossly intact Skin: Skin is warm. No rash, no LE edema Psychiatric: Pt behavior is normal. No agitation.     Assessment & Plan:

## 2015-03-17 NOTE — Assessment & Plan Note (Signed)
stable overall by history and exam, recent data reviewed with pt, and pt to continue medical treatment as before,  to f/u any worsening symptoms or concerns BP Readings from Last 3 Encounters:  03/17/15 124/80  12/13/14 130/82  03/11/14 136/86

## 2015-03-17 NOTE — Patient Instructions (Signed)
Please take all new medication as prescribed - the antibiotic  Please continue all other medications as before, and refills have been done if requested.  Please have the pharmacy call with any other refills you may need.  Please keep your appointments with your specialists as you may have planned  Please go to the LAB in the Basement (turn left off the elevator) for the tests to be done today - just the urine testing today  You will be contacted by phone if any changes need to be made immediately.  Otherwise, you will receive a letter about your results with an explanation, but please check with MyChart first.  Please remember to sign up for MyChart if you have not done so, as this will be important to you in the future with finding out test results, communicating by private email, and scheduling acute appointments online when needed.  

## 2015-03-17 NOTE — Assessment & Plan Note (Signed)
Likely UTI by hx and exam, explained to pt not likely constipatoin related as she feared; for urine studies, but for empiric antibx - cephalexin

## 2015-03-19 LAB — URINE CULTURE: Colony Count: 30000

## 2015-03-20 ENCOUNTER — Ambulatory Visit (HOSPITAL_COMMUNITY)
Admission: RE | Admit: 2015-03-20 | Discharge: 2015-03-20 | Disposition: A | Discharge status: Home / Self Care | Payer: BC Managed Care – PPO | Source: Ambulatory Visit | Attending: Family Medicine | Admitting: Family Medicine

## 2015-03-20 ENCOUNTER — Telehealth: Payer: Self-pay | Admitting: Family Medicine

## 2015-03-20 ENCOUNTER — Encounter (HOSPITAL_COMMUNITY): Payer: Self-pay

## 2015-03-20 ENCOUNTER — Encounter: Payer: Self-pay | Admitting: Family Medicine

## 2015-03-20 ENCOUNTER — Telehealth: Payer: Self-pay | Admitting: Internal Medicine

## 2015-03-20 ENCOUNTER — Encounter (HOSPITAL_COMMUNITY): Payer: Self-pay | Admitting: *Deleted

## 2015-03-20 ENCOUNTER — Inpatient Hospital Stay (HOSPITAL_COMMUNITY)
Admission: EM | Admit: 2015-03-20 | Discharge: 2015-03-30 | DRG: 392 | Disposition: A | Discharge status: Home Health | Payer: BC Managed Care – PPO | Attending: Internal Medicine | Admitting: Internal Medicine

## 2015-03-20 ENCOUNTER — Ambulatory Visit (INDEPENDENT_AMBULATORY_CARE_PROVIDER_SITE_OTHER): Payer: BC Managed Care – PPO | Admitting: Family Medicine

## 2015-03-20 VITALS — BP 112/72 | HR 100 | Temp 98.3°F | Ht 66.0 in | Wt 267.0 lb

## 2015-03-20 DIAGNOSIS — N390 Urinary tract infection, site not specified: Secondary | ICD-10-CM | POA: Diagnosis present

## 2015-03-20 DIAGNOSIS — R1031 Right lower quadrant pain: Secondary | ICD-10-CM | POA: Insufficient documentation

## 2015-03-20 DIAGNOSIS — K5720 Diverticulitis of large intestine with perforation and abscess without bleeding: Secondary | ICD-10-CM | POA: Diagnosis present

## 2015-03-20 DIAGNOSIS — R300 Dysuria: Secondary | ICD-10-CM | POA: Diagnosis present

## 2015-03-20 DIAGNOSIS — D649 Anemia, unspecified: Secondary | ICD-10-CM | POA: Diagnosis present

## 2015-03-20 DIAGNOSIS — R6883 Chills (without fever): Secondary | ICD-10-CM

## 2015-03-20 DIAGNOSIS — G629 Polyneuropathy, unspecified: Secondary | ICD-10-CM | POA: Diagnosis present

## 2015-03-20 DIAGNOSIS — K651 Peritoneal abscess: Secondary | ICD-10-CM | POA: Diagnosis not present

## 2015-03-20 DIAGNOSIS — E871 Hypo-osmolality and hyponatremia: Secondary | ICD-10-CM | POA: Diagnosis present

## 2015-03-20 DIAGNOSIS — Z6841 Body Mass Index (BMI) 40.0 and over, adult: Secondary | ICD-10-CM | POA: Diagnosis not present

## 2015-03-20 DIAGNOSIS — K5790 Diverticulosis of intestine, part unspecified, without perforation or abscess without bleeding: Secondary | ICD-10-CM

## 2015-03-20 DIAGNOSIS — K5740 Diverticulitis of both small and large intestine with perforation and abscess without bleeding: Secondary | ICD-10-CM

## 2015-03-20 DIAGNOSIS — M1990 Unspecified osteoarthritis, unspecified site: Secondary | ICD-10-CM | POA: Diagnosis present

## 2015-03-20 DIAGNOSIS — I10 Essential (primary) hypertension: Secondary | ICD-10-CM

## 2015-03-20 DIAGNOSIS — E669 Obesity, unspecified: Secondary | ICD-10-CM | POA: Diagnosis present

## 2015-03-20 DIAGNOSIS — T502X5A Adverse effect of carbonic-anhydrase inhibitors, benzothiadiazides and other diuretics, initial encounter: Secondary | ICD-10-CM | POA: Diagnosis present

## 2015-03-20 DIAGNOSIS — R1032 Left lower quadrant pain: Secondary | ICD-10-CM

## 2015-03-20 DIAGNOSIS — R000 Tachycardia, unspecified: Secondary | ICD-10-CM | POA: Diagnosis present

## 2015-03-20 DIAGNOSIS — R109 Unspecified abdominal pain: Secondary | ICD-10-CM | POA: Diagnosis present

## 2015-03-20 DIAGNOSIS — K5900 Constipation, unspecified: Secondary | ICD-10-CM | POA: Diagnosis not present

## 2015-03-20 DIAGNOSIS — K5732 Diverticulitis of large intestine without perforation or abscess without bleeding: Secondary | ICD-10-CM

## 2015-03-20 DIAGNOSIS — K5792 Diverticulitis of intestine, part unspecified, without perforation or abscess without bleeding: Secondary | ICD-10-CM | POA: Diagnosis present

## 2015-03-20 DIAGNOSIS — K219 Gastro-esophageal reflux disease without esophagitis: Secondary | ICD-10-CM | POA: Diagnosis present

## 2015-03-20 DIAGNOSIS — K649 Unspecified hemorrhoids: Secondary | ICD-10-CM | POA: Diagnosis present

## 2015-03-20 DIAGNOSIS — IMO0002 Reserved for concepts with insufficient information to code with codable children: Secondary | ICD-10-CM

## 2015-03-20 DIAGNOSIS — Z7982 Long term (current) use of aspirin: Secondary | ICD-10-CM

## 2015-03-20 DIAGNOSIS — E876 Hypokalemia: Secondary | ICD-10-CM | POA: Diagnosis present

## 2015-03-20 HISTORY — DX: Diverticulitis of intestine, part unspecified, without perforation or abscess without bleeding: K57.92

## 2015-03-20 LAB — COMPREHENSIVE METABOLIC PANEL
ALT: 22 U/L (ref 14–54)
ALT: 21 U/L (ref 14–54)
AST: 25 U/L (ref 15–41)
AST: 23 U/L (ref 15–41)
Albumin: 3.1 g/dL — ABNORMAL LOW (ref 3.5–5.0)
Albumin: 3 g/dL — ABNORMAL LOW (ref 3.5–5.0)
Alkaline Phosphatase: 81 U/L (ref 38–126)
Alkaline Phosphatase: 82 U/L (ref 38–126)
Anion gap: 12 (ref 5–15)
Anion gap: 11 (ref 5–15)
BUN: 7 mg/dL (ref 6–20)
BUN: 8 mg/dL (ref 6–20)
CO2: 28 mmol/L (ref 22–32)
CO2: 27 mmol/L (ref 22–32)
Calcium: 8.9 mg/dL (ref 8.9–10.3)
Calcium: 8.6 mg/dL — ABNORMAL LOW (ref 8.9–10.3)
Chloride: 96 mmol/L — ABNORMAL LOW (ref 101–111)
Chloride: 91 mmol/L — ABNORMAL LOW (ref 101–111)
Creatinine, Ser: 0.83 mg/dL (ref 0.44–1.00)
Creatinine, Ser: 0.93 mg/dL (ref 0.44–1.00)
GFR calc Af Amer: >60 mL/min (ref >60)
GFR calc Af Amer: >60 mL/min (ref >60)
GFR calc non Af Amer: >60 mL/min (ref >60)
GFR calc non Af Amer: >60 mL/min (ref >60)
Glucose, Bld: 94 mg/dL (ref 65–99)
Glucose, Bld: 112 mg/dL — ABNORMAL HIGH (ref 65–99)
Potassium: 2.9 mmol/L — ABNORMAL LOW (ref 3.5–5.1)
Potassium: 2.9 mmol/L — ABNORMAL LOW (ref 3.5–5.1)
Sodium: 136 mmol/L (ref 135–145)
Sodium: 129 mmol/L — ABNORMAL LOW (ref 135–145)
Total Bilirubin: 1.6 mg/dL — ABNORMAL HIGH (ref 0.3–1.2)
Total Bilirubin: 1.5 mg/dL — ABNORMAL HIGH (ref 0.3–1.2)
Total Protein: 8.1 g/dL (ref 6.5–8.1)
Total Protein: 7.7 g/dL (ref 6.5–8.1)

## 2015-03-20 LAB — CBC
HCT: 39.7 % (ref 36.0–46.0)
HCT: 38 % (ref 36.0–46.0)
Hemoglobin: 13.1 g/dL (ref 12.0–15.0)
Hemoglobin: 12.7 g/dL (ref 12.0–15.0)
MCH: 29 pg (ref 26.0–34.0)
MCH: 29.1 pg (ref 26.0–34.0)
MCHC: 33 g/dL (ref 30.0–36.0)
MCHC: 33.4 g/dL (ref 30.0–36.0)
MCV: 87.8 fL (ref 78.0–100.0)
MCV: 87.2 fL (ref 78.0–100.0)
Platelets: 262 10*3/uL (ref 150–400)
Platelets: 277 10*3/uL (ref 150–400)
RBC: 4.52 MIL/uL (ref 3.87–5.11)
RBC: 4.36 MIL/uL (ref 3.87–5.11)
RDW: 12.7 % (ref 11.5–15.5)
RDW: 12.7 % (ref 11.5–15.5)
WBC: 15.2 10*3/uL — ABNORMAL HIGH (ref 4.0–10.5)
WBC: 14.9 10*3/uL — ABNORMAL HIGH (ref 4.0–10.5)

## 2015-03-20 LAB — I-STAT CG4 LACTIC ACID, ED: Lactic Acid, Venous: 1.27 mmol/L (ref 0.5–2.0)

## 2015-03-20 LAB — LIPASE, BLOOD: Lipase: 19 U/L — ABNORMAL LOW (ref 22–51)

## 2015-03-20 MED ORDER — SODIUM CHLORIDE 0.9 % IJ SOLN
3.0000 mL | Freq: Two times a day (BID) | INTRAMUSCULAR | Status: DC
  Administered 2015-03-22 – 2015-03-30 (×4): 3 mL via INTRAVENOUS

## 2015-03-20 MED ORDER — METRONIDAZOLE IN NACL 5-0.79 MG/ML-% IV SOLN
500.0000 mg | Freq: Once | INTRAVENOUS | Status: AC
  Administered 2015-03-20: 500 mg via INTRAVENOUS
  Filled 2015-03-20: qty 100

## 2015-03-20 MED ORDER — IRBESARTAN-HYDROCHLOROTHIAZIDE 150-12.5 MG PO TABS: 2.0000 | ORAL_TABLET | Freq: Every day | ORAL | Status: DC

## 2015-03-20 MED ORDER — HYDROCHLOROTHIAZIDE 12.5 MG PO CAPS
12.5000 mg | ORAL_CAPSULE | Freq: Every day | ORAL | Status: DC
  Administered 2015-03-21 – 2015-03-30 (×10): 12.5 mg via ORAL
  Filled 2015-03-20 (×10): qty 1

## 2015-03-20 MED ORDER — HEPARIN SODIUM (PORCINE) 5000 UNIT/ML IJ SOLN
5000.0000 [IU] | Freq: Three times a day (TID) | INTRAMUSCULAR | Status: DC
  Administered 2015-03-20 – 2015-03-27 (×19): 5000 [IU] via SUBCUTANEOUS
  Filled 2015-03-20 (×19): qty 1

## 2015-03-20 MED ORDER — MORPHINE SULFATE (PF) 4 MG/ML IV SOLN
4.0000 mg | INTRAVENOUS | Status: DC | PRN
  Administered 2015-03-21 – 2015-03-22 (×7): 4 mg via INTRAVENOUS
  Filled 2015-03-20 (×7): qty 1

## 2015-03-20 MED ORDER — HYDROMORPHONE HCL 1 MG/ML IJ SOLN: 1.0000 mg | Freq: Once | INTRAMUSCULAR | Status: DC

## 2015-03-20 MED ORDER — ONDANSETRON HCL 4 MG/2ML IJ SOLN
4.0000 mg | Freq: Four times a day (QID) | INTRAMUSCULAR | Status: DC | PRN
  Administered 2015-03-25 (×2): 4 mg via INTRAVENOUS
  Filled 2015-03-20 (×2): qty 2

## 2015-03-20 MED ORDER — SODIUM CHLORIDE 0.9 % IV SOLN
Freq: Once | INTRAVENOUS | Status: AC
  Administered 2015-03-20: 21:00:00 via INTRAVENOUS

## 2015-03-20 MED ORDER — ASPIRIN EC 81 MG PO TBEC
81.0000 mg | DELAYED_RELEASE_TABLET | Freq: Every day | ORAL | Status: DC
  Administered 2015-03-21 – 2015-03-30 (×10): 81 mg via ORAL
  Filled 2015-03-20 (×12): qty 1

## 2015-03-20 MED ORDER — SODIUM CHLORIDE 0.9 % IV SOLN
INTRAVENOUS | Status: DC
  Administered 2015-03-21 – 2015-03-24 (×8): via INTRAVENOUS

## 2015-03-20 MED ORDER — ONDANSETRON HCL 4 MG/2ML IJ SOLN
4.0000 mg | Freq: Once | INTRAMUSCULAR | Status: AC
  Administered 2015-03-20: 4 mg via INTRAVENOUS
  Filled 2015-03-20: qty 2

## 2015-03-20 MED ORDER — HYDRALAZINE HCL 20 MG/ML IJ SOLN: 5.0000 mg | INTRAMUSCULAR | Status: DC | PRN

## 2015-03-20 MED ORDER — POTASSIUM CHLORIDE 10 MEQ/100ML IV SOLN
10.0000 meq | INTRAVENOUS | Status: AC
  Administered 2015-03-20 – 2015-03-21 (×6): 10 meq via INTRAVENOUS
  Filled 2015-03-20 (×6): qty 100

## 2015-03-20 MED ORDER — HYDROMORPHONE HCL 1 MG/ML IJ SOLN
0.5000 mg | Freq: Once | INTRAMUSCULAR | Status: AC
  Administered 2015-03-20: 0.5 mg via INTRAVENOUS
  Filled 2015-03-20: qty 1

## 2015-03-20 MED ORDER — POTASSIUM CHLORIDE CRYS ER 20 MEQ PO TBCR
40.0000 meq | EXTENDED_RELEASE_TABLET | Freq: Once | ORAL | Status: AC
  Administered 2015-03-20: 40 meq via ORAL
  Filled 2015-03-20: qty 2

## 2015-03-20 MED ORDER — CIPROFLOXACIN IN D5W 400 MG/200ML IV SOLN
400.0000 mg | Freq: Once | INTRAVENOUS | Status: AC
  Administered 2015-03-20: 400 mg via INTRAVENOUS
  Filled 2015-03-20: qty 200

## 2015-03-20 MED ORDER — IRBESARTAN 150 MG PO TABS
150.0000 mg | ORAL_TABLET | Freq: Every day | ORAL | Status: DC
  Administered 2015-03-21 – 2015-03-30 (×10): 150 mg via ORAL
  Filled 2015-03-20 (×10): qty 1

## 2015-03-20 MED ORDER — IOHEXOL 300 MG/ML  SOLN
100.0000 mL | Freq: Once | INTRAMUSCULAR | Status: AC | PRN
  Administered 2015-03-20: 100 mL via INTRAVENOUS

## 2015-03-20 NOTE — ED Notes (Signed)
Surgery MD at bedside.

## 2015-03-20 NOTE — H&P (Signed)
[facility] History and Physical  Veronica [name] YTK:[id number] DOB: 05-27-1951 DOA: 03/20/2015  Referring physician: PA Carolyne Fiscal - [facility] PCP: Cathlean Cower, MD   Chief Complaint: Abd pain  HPI: Veronica [name] is a 64 y.o. female  Abdominal pain. Started 1 week ago. Getting worse. Comes and goes. Left lower quadrant initially but now more generalized. Associated with watery diarrhea. Denies bloody bowel movements, vomiting, fevers, dysuria, frequency. Pain is worse with meals. Some improvement after trying Dulcolax couple of days ago.   Review of Systems:  Constitutional:  No weight loss, night sweats, Fevers HEENT:  No headaches, Difficulty swallowing,Tooth/dental problems,Sore throat,  No sneezing, itching, ear ache, nasal congestion, post nasal drip,  Cardio-vascular:  No chest pain, Orthopnea, PND, swelling in lower extremities, anasarca, dizziness, palpitations  GI: Per HPi Resp:   No shortness of breath with exertion or at rest. No excess mucus, no productive cough, No non-productive cough, No coughing up of blood.No change in color of mucus.No wheezing.No chest wall deformity  Skin:  no rash or lesions.  GU:  no dysuria, change in color of urine, no urgency or frequency. No flank pain.  Musculoskeletal:   No joint pain or swelling. No decreased range of motion. No back pain.  Psych:  No change in mood or affect. No depression or anxiety. No memory loss.   Past Medical History  Diagnosis Date  . Broken ribs     hx of  . Hemorrhoids   . Allergy   . Arthritis   . GERD (gastroesophageal reflux disease)   . Hypertension   . Carpal tunnel syndrome 12/27/2010   Past Surgical History  Procedure Laterality Date  . Cholecystectomy  2000   Social History:  reports that she has never smoked. She does not have any smokeless tobacco history on file. She reports that she does not drink alcohol or use illicit drugs.  Allergies  Allergen Reactions  .  Clarithromycin     Family History  Problem Relation Age of Onset  . Arthritis Other   . Alcohol abuse Other   . Heart disease Other   . Hypertension Other   . Cancer Other   . Heart disease Other      Prior to Admission medications   Medication Sig Start Date End Date Taking? Authorizing Provider  acetaminophen (TYLENOL) 650 MG CR tablet Take 650 mg by mouth every 8 (eight) hours as needed for pain.   Yes Historical Provider, MD  aspirin 81 MG EC tablet Take 1 tablet (81 mg total) by mouth daily. Swallow whole. Patient taking differently: Take 81 mg by mouth every 14 (fourteen) days. Swallow whole. 11/27/12  Yes Biagio Borg, MD  cephALEXin (KEFLEX) 500 MG capsule Take 1 capsule (500 mg total) by mouth 4 (four) times daily. 03/17/15  Yes Biagio Borg, MD  irbesartan-hydrochlorothiazide (AVALIDE) 150-12.5 MG per tablet TAKE 2 TABLETS BY MOUTH ONCE DAILY 11/30/13  Yes Biagio Borg, MD  traMADol Veatrice Bourbon) 50 MG tablet take 1 tablet by mouth every 6 hours if needed for pain 12/23/14  Yes Biagio Borg, MD   Physical Exam: Filed Vitals:   03/20/15 2130 03/20/15 2145 03/20/15 2215 03/20/15 2238  BP: 129/63 119/60 123/61 131/72  Pulse: 87 92 86 88  Temp:    98.1 F (36.7 C)  TempSrc:    Oral  Resp:    19  Height:    5\' 6"  (1.676 m)  Weight:    122.335 kg (269  lb 11.2 oz)  SpO2: 98% 99% 97% 97%    Wt Readings from Last 3 Encounters:  03/20/15 122.335 kg (269 lb 11.2 oz)  03/20/15 121.11 kg (267 lb)  03/17/15 121.564 kg (268 lb)    General:  Appears calm and comfortable Eyes:  PERRL, normal lids, irises & conjunctiva ENT:  grossly normal hearing, lips & tongue Neck:  no LAD, masses or thyromegaly Cardiovascular:  RRR, no m/r/g. No LE edema. Respiratory:  CTA bilaterally, no w/r/r. Normal respiratory effort. Abdomen:  Soft, mild tenderness to palpation in the umbilical and left lower quadrant areas. Negative for Murphy sign or tenderness at McBurney's point. Nondistended. Skin:  no  rash or induration seen on limited exam Musculoskeletal:  grossly normal tone BUE/BLE Psychiatric:  grossly normal mood and affect, speech fluent and appropriate Neurologic:  grossly non-focal.          Labs on Admission:  Basic Metabolic Panel:  Recent Labs Lab 03/20/15 1315 03/20/15 1732  NA 136 129*  K 2.9* 2.9*  CL 96* 91*  CO2 28 27  GLUCOSE 94 112*  BUN 7 8  CREATININE 0.83 0.93  CALCIUM 8.9 8.6*   Liver Function Tests:  Recent Labs Lab 03/20/15 1315 03/20/15 1732  AST 25 23  ALT 22 21  ALKPHOS 81 82  BILITOT 1.6* 1.5*  PROT 8.1 7.7  ALBUMIN 3.1* 3.0*    Recent Labs Lab 03/20/15 1732  LIPASE 19*   No results for input(s): AMMONIA in the last 168 hours. CBC:  Recent Labs Lab 03/20/15 1315 03/20/15 1732  WBC 15.2* 14.9*  HGB 13.1 12.7  HCT 39.7 38.0  MCV 87.8 87.2  PLT 262 277   Cardiac Enzymes: No results for input(s): CKTOTAL, CKMB, CKMBINDEX, TROPONINI in the last 168 hours.  BNP (last 3 results) No results for input(s): BNP in the last 8760 hours.  ProBNP (last 3 results) No results for input(s): PROBNP in the last 8760 hours.  CBG: No results for input(s): GLUCAP in the last 168 hours.  Radiological Exams on Admission: Ct Abdomen Pelvis W Contrast  03/20/2015   ADDENDUM REPORT: 03/20/2015 16:15 ADDENDUM: These results were called by telephone at the time of interpretation on 03/20/2015 at 4:14 pm to Dr. Thersa Salt , who verbally acknowledged these results. Electronically Signed   By: Jerilynn Mages.  Shick M.D.   On: 03/20/2015 16:15  03/20/2015   CLINICAL DATA:  Acute lower abdominal and pelvic pain, dysuria, rectal pain, elevated white count.  EXAM: CT ABDOMEN AND PELVIS WITH CONTRAST  TECHNIQUE: Multidetector CT imaging of the abdomen and pelvis was performed using the standard protocol following bolus administration of intravenous contrast.  CONTRAST:  157mL OMNIPAQUE IOHEXOL 300 MG/ML  SOLN  COMPARISON:  None.  FINDINGS: Lower chest: Healed right  lower rib fractures with deformity. Lung bases remain clear. Normal heart size. No pericardial or pleural effusion. Degenerative changes of the lower thoracic spine. Small hiatal hernia evident.  Abdomen: Prior cholecystectomy. Small midline fat containing umbilical hernia with an abdominal defect measuring 4.5 cm, image 43. This is just above the umbilicus.  Liver, biliary system, pancreas, spleen, adrenal glands, and kidneys are within normal limits for age and demonstrate no acute process.  Negative for bowel obstruction, dilatation, ileus, or significant free air.  Retro aortic left renal vein noted, normal variant.  Intact aorta. Negative for aneurysm. No acute retroperitoneal process.  Pelvis: Diverticulosis present of the colon. Lower sigmoid and rectum demonstrates wall thickening with surrounding edema and inflammation  compatible with diverticulitis. Difficult to exclude a mural lesion. Deep within the pelvis, 2 small fluid collections are present 1 the midline measures 39 x 31 mm posterior to the uterus image 65. Left adnexal small fluid collection versus ovarian cyst measures 30 x 21 mm, image 63. Urinary bladder collapsed. Uterus is tilted to the right.  No adenopathy , inguinal abnormality, or inguinal hernia.  Degenerative changes of the lower lumbar spine and SI joints.  IMPRESSION: Acute diverticulitis of the lower sigmoid colon and rectum.  Midline pelvic small fluid collection measuring 4 x 3 cm suspicious for associated abscess  Left adnexal small fluid collection versus cystic area along the left ovary could represent a second abscess versus a small ovarian cyst measuring 30 x 21 mm.  No associated obstruction or ileus  Small fat containing ventral hernia superior to the umbilicus.  Prior cholecystectomy  Small hiatal hernia  Electronically Signed: By: Jerilynn Mages.  Shick M.D. On: 03/20/2015 15:55     Assessment/Plan Principal Problem:   Diverticulitis Active Problems:   Abdominal abscess ([place])    Hyponatremia   Essential hypertension   Hypokalemia   Diverticulitis/Abd abscess: CT showing diverticulitis of the lower sigmoid and rectum along w/ a 4x3cm fluid collection in the midline pelvic region and possible ovarian cyst vs abscess. Evidence of perforation. Surgery consulted and appreciate their input. Recommend IV antibiotics and possible IR drainage - MedSurg - continue Cipro Flagyl - nothing by mouth - follow-up surgery/IR recommendations  HTN: normotensive - continue home avalide - Hydralazine PRN  Hyponatremia: Na 129 on admission. ??? Lab error as pt earlier on day of admission w/ 136.  - IVF (OK for rapid correction)  HypoK: 2.9 - KCL - Mag - BMET in am   Code Status: FULL  DVT Prophylaxis: Hep Family Communication: Husband and daughter Disposition Plan: Pending improveemtn     MERRELL, DAVID Lenna Sciara, MD Family Medicine [facility] [email or web address] Password [email or web address]

## 2015-03-20 NOTE — Progress Notes (Signed)
Pre visit review using our clinic review tool, if applicable. No additional management support is needed unless otherwise documented below in the visit note. 

## 2015-03-20 NOTE — Progress Notes (Signed)
NP Veronica [name] paged to make aware of patient's arrival to floor. Veronica [name]

## 2015-03-20 NOTE — Consult Note (Signed)
Reason for Consult: Diverticulitis Referring Physician: Dr. Maylene Roes is an 64 y.o. female.  HPI: 63 year old female who comes in today secondary to a one-week history of abdominal pain. Patient was seen by her PCP thereafter and was treated for a UTI. Patient continue with abdominal pain and was sent to the ER for a CT scan. CT scan revealed diverticulitis with likely pericolonic abscess. Laboratory studies also revealed an elevated WBC count.  Patient does state that she had a colonoscopy in March of this year which revealed diverticulosis. No polyps or masses seen.  Past Medical History  Diagnosis Date  . Broken ribs     hx of  . Hemorrhoids   . Allergy   . Arthritis   . GERD (gastroesophageal reflux disease)   . Hypertension   . Carpal tunnel syndrome 12/27/2010    Past Surgical History  Procedure Laterality Date  . Cholecystectomy  2000    Family History  Problem Relation Age of Onset  . Arthritis Other   . Alcohol abuse Other   . Heart disease Other   . Hypertension Other   . Cancer Other   . Heart disease Other     Social History:  reports that she has never smoked. She does not have any smokeless tobacco history on file. She reports that she does not drink alcohol or use illicit drugs.  Allergies:  Allergies  Allergen Reactions  . Clarithromycin     Medications: I have reviewed the patient's current medications.  Results for orders placed or performed during the hospital encounter of 03/20/15 (from the past 48 hour(s))  Lipase, blood     Status: Abnormal   Collection Time: 03/20/15  5:32 PM  Result Value Ref Range   Lipase 19 (L) 22 - 51 U/L  Comprehensive metabolic panel     Status: Abnormal   Collection Time: 03/20/15  5:32 PM  Result Value Ref Range   Sodium 129 (L) 135 - 145 mmol/L    Comment: DELTA CHECK NOTED   Potassium 2.9 (L) 3.5 - 5.1 mmol/L   Chloride 91 (L) 101 - 111 mmol/L   CO2 27 22 - 32 mmol/L   Glucose, Bld 112 (H) 65  - 99 mg/dL   BUN 8 6 - 20 mg/dL   Creatinine, Ser 5.31 0.44 - 1.00 mg/dL   Calcium 8.6 (L) 8.9 - 10.3 mg/dL   Total Protein 7.7 6.5 - 8.1 g/dL   Albumin 3.0 (L) 3.5 - 5.0 g/dL   AST 23 15 - 41 U/L   ALT 21 14 - 54 U/L   Alkaline Phosphatase 82 38 - 126 U/L   Total Bilirubin 1.5 (H) 0.3 - 1.2 mg/dL   GFR calc non Af Amer >60 >60 mL/min   GFR calc Af Amer >60 >60 mL/min    Comment: (NOTE) The eGFR has been calculated using the CKD EPI equation. This calculation has not been validated in all clinical situations. eGFR's persistently <60 mL/min signify possible Chronic Kidney Disease.    Anion gap 11 5 - 15  CBC     Status: Abnormal   Collection Time: 03/20/15  5:32 PM  Result Value Ref Range   WBC 14.9 (H) 4.0 - 10.5 K/uL   RBC 4.36 3.87 - 5.11 MIL/uL   Hemoglobin 12.7 12.0 - 15.0 g/dL   HCT 67.9 93.2 - 73.6 %   MCV 87.2 78.0 - 100.0 fL   MCH 29.1 26.0 - 34.0 pg  MCHC 33.4 30.0 - 36.0 g/dL   RDW 12.7 11.5 - 15.5 %   Platelets 277 150 - 400 K/uL  I-Stat CG4 Lactic Acid, ED     Status: None   Collection Time: 03/20/15  8:35 PM  Result Value Ref Range   Lactic Acid, Venous 1.27 0.5 - 2.0 mmol/L    Ct Abdomen Pelvis W Contrast  03/20/2015   ADDENDUM REPORT: 03/20/2015 16:15 ADDENDUM: These results were called by telephone at the time of interpretation on 03/20/2015 at 4:14 pm to Dr. Thersa Salt , who verbally acknowledged these results. Electronically Signed   By: Jerilynn Mages.  Shick M.D.   On: 03/20/2015 16:15  03/20/2015   CLINICAL DATA:  Acute lower abdominal and pelvic pain, dysuria, rectal pain, elevated white count.  EXAM: CT ABDOMEN AND PELVIS WITH CONTRAST  TECHNIQUE: Multidetector CT imaging of the abdomen and pelvis was performed using the standard protocol following bolus administration of intravenous contrast.  CONTRAST:  185mL OMNIPAQUE IOHEXOL 300 MG/ML  SOLN  COMPARISON:  None.  FINDINGS: Lower chest: Healed right lower rib fractures with deformity. Lung bases remain clear. Normal  heart size. No pericardial or pleural effusion. Degenerative changes of the lower thoracic spine. Small hiatal hernia evident.  Abdomen: Prior cholecystectomy. Small midline fat containing umbilical hernia with an abdominal defect measuring 4.5 cm, image 43. This is just above the umbilicus.  Liver, biliary system, pancreas, spleen, adrenal glands, and kidneys are within normal limits for age and demonstrate no acute process.  Negative for bowel obstruction, dilatation, ileus, or significant free air.  Retro aortic left renal vein noted, normal variant.  Intact aorta. Negative for aneurysm. No acute retroperitoneal process.  Pelvis: Diverticulosis present of the colon. Lower sigmoid and rectum demonstrates wall thickening with surrounding edema and inflammation compatible with diverticulitis. Difficult to exclude a mural lesion. Deep within the pelvis, 2 small fluid collections are present 1 the midline measures 39 x 31 mm posterior to the uterus image 65. Left adnexal small fluid collection versus ovarian cyst measures 30 x 21 mm, image 63. Urinary bladder collapsed. Uterus is tilted to the right.  No adenopathy , inguinal abnormality, or inguinal hernia.  Degenerative changes of the lower lumbar spine and SI joints.  IMPRESSION: Acute diverticulitis of the lower sigmoid colon and rectum.  Midline pelvic small fluid collection measuring 4 x 3 cm suspicious for associated abscess  Left adnexal small fluid collection versus cystic area along the left ovary could represent a second abscess versus a small ovarian cyst measuring 30 x 21 mm.  No associated obstruction or ileus  Small fat containing ventral hernia superior to the umbilicus.  Prior cholecystectomy  Small hiatal hernia  Electronically Signed: By: Jerilynn Mages.  Shick M.D. On: 03/20/2015 15:55    Review of Systems  Constitutional: Positive for chills. Negative for weight loss.  HENT: Negative for ear discharge, ear pain, hearing loss and tinnitus.   Eyes:  Negative for blurred vision, double vision, photophobia and pain.  Respiratory: Negative for cough, sputum production and shortness of breath.   Cardiovascular: Negative for chest pain.  Gastrointestinal: Positive for abdominal pain and diarrhea. Negative for nausea and vomiting.  Genitourinary: Negative for dysuria, urgency, frequency and flank pain.  Musculoskeletal: Negative for myalgias, back pain, joint pain, falls and neck pain.  Neurological: Negative for dizziness, tingling, sensory change, focal weakness, loss of consciousness and headaches.  Endo/Heme/Allergies: Does not bruise/bleed easily.  Psychiatric/Behavioral: Negative for depression, memory loss and substance abuse. The patient is  not nervous/anxious.    Blood pressure 111/56, pulse 91, temperature 99 F (37.2 C), temperature source Oral, resp. rate 18, height $RemoveBe'5\' 6"'[id number]$  (1.676 m), weight 121.11 kg (267 lb), SpO2 99 %. Physical Exam  Constitutional: She is oriented to person, place, and time. She appears well-developed and well-nourished.  HENT:  Head: Normocephalic and atraumatic.  Eyes: Conjunctivae and EOM are normal. Pupils are equal, round, and reactive to light.  Neck: Normal range of motion. Neck supple.  Cardiovascular: Normal rate, regular rhythm and normal heart sounds.   Respiratory: Effort normal and breath sounds normal.  GI: Soft. Bowel sounds are normal. She exhibits no distension. Tenderness: infraumbilcally. There is no rebound and no guarding.  Musculoskeletal: Normal range of motion.  Neurological: She is alert and oriented to person, place, and time.    Assessment/Plan: 64 year old female with diverticulitis.  1. Would recommend nothing by mouth 2. IV antibiotics 3. We'll consult IR for possible fluid aspiration versus drain placement. 4. We will follow along.  Rosario Jacks., Dawit Tankard 03/20/2015, 9:40 PM

## 2015-03-20 NOTE — Patient Instructions (Signed)
Take the tramadol and tylenol as needed.  We will call with the results of the scan.  Take care  Be sure to follow up with Dr. Jenny Reichmann

## 2015-03-20 NOTE — Progress Notes (Signed)
Subjective:  Patient ID: Veronica [name], female    DOB: 08-16-1950  Age: 64 y.o. MRN: [id number]  CC: Abdominal pain  HPI:  64 year old female presents to clinic today for an acute visit with complaints of abdominal pain.  Abdominal pain  Patient reports that she has had symptoms since Tuesday.  She reports that she initially developed O'Donnell pain associated urinary urgency, frequency, and pressure.  She states that she saw her primary care physician. Urinalysis was consistent with infection and was sent for culture. Patient was started on empiric antibiotics.  Patient states that her symptoms are not resolved and she now has moderate to severe lower abdominal pain.  Abdominal pain is located bilaterally in the lower abdomen and also in the suprapubic region.  She has had no relief with antibiotics.  She has taken Tylenol with some relief.  Associated fevers or back pain or flank pain. She does report that she's had some recent chills.  She reports that she's had some intermittent constipation and has taken Dulcolax. She reports that she has had a recent bowel movement. No hematochezia or melena. She has had some nausea. No vomiting.  Social Hx   Social History   Social History  . Marital Status: Married    Spouse Name: N/A  . Number of Children: N/A  . Years of Education: 16   Occupational History  . Aministrative support [employer]   Social History Main Topics  . Smoking status: Never Smoker   . Smokeless tobacco: None  . Alcohol Use: No  . Drug Use: No  . Sexual Activity: Not Asked   Other Topics Concern  . None   Social History Narrative   Review of Systems  All other systems reviewed and are negative.  Objective:  BP 112/72 mmHg  Pulse 100  Temp(Src) 98.3 F (36.8 C) (Oral)  Ht $R'5\' 6"'Wq$  (1.676 m)  Wt 267 lb (121.11 kg)  BMI 43.12 kg/m2  SpO2 97%  BP/Weight 03/20/2015 03/17/2015 0/86/7619  Systolic BP 509 326 712  Diastolic BP 72 80  82  Wt. (Lbs) 267 268 277  BMI 43.12 43.28 44.73    Physical Exam  Constitutional: She is oriented to person, place, and time. She appears well-developed and well-nourished.  Obese.  Cardiovascular: Normal rate and regular rhythm.   Pulmonary/Chest: Effort normal and breath sounds normal.  Abdominal: Soft. There is no rebound and no guarding.  Tender to palpation in the LLQ, RLQ and suprapubic regions.  Neurological: She is alert and oriented to person, place, and time.  Psychiatric: She has a normal mood and affect.  Vitals reviewed.  Lab Results  Component Value Date   WBC 15.2* 03/20/2015   HGB 13.1 03/20/2015   HCT 39.7 03/20/2015   PLT 262 03/20/2015   GLUCOSE 91 12/13/2014   CHOL 136 12/13/2014   TRIG 55.0 12/13/2014   HDL 44.30 12/13/2014   LDLCALC 81 12/13/2014   ALT 13 12/13/2014   AST 15 12/13/2014   NA 141 12/13/2014   K 4.4 12/13/2014   CL 104 12/13/2014   CREATININE 0.79 12/13/2014   BUN 15 12/13/2014   CO2 31 12/13/2014   TSH 3.05 12/13/2014    Assessment & Plan:   Problem List Items Addressed This Visit    Acute bilateral lower abdominal pain - Primary    Unclear etiology. Quite tender on exam. Recent urine culture revealed 30,000 CFU of Group B strep (symptoms unlikely to be from infection). Obtaining  labs: CBC, CMP. Sending for CT scan for further evaluation.      Relevant Orders   CT Abdomen Pelvis W Contrast   BUN+Creat   CBC   Comp Met (CMET)     Follow-up: PRN  Thersa Salt, DO

## 2015-03-20 NOTE — ED Provider Notes (Signed)
CSN: [id number]     Arrival date & time 03/20/15  1655 History   First MD Initiated Contact with Patient 03/20/15 2002     Chief Complaint  Patient presents with  . Abdominal Pain     (Consider location/radiation/quality/duration/timing/severity/associated sxs/prior Treatment) Patient is a 64 y.o. female presenting with abdominal pain. The history is provided by the patient, a relative and medical records.  Abdominal Pain Pain location:  RLQ, LLQ and suprapubic Pain quality: aching and sharp   Pain radiates to:  Does not radiate Pain severity:  Moderate Onset quality:  Gradual Duration:  1 week Timing:  Constant Progression:  Worsening Chronicity:  New Context: not trauma   Associated symptoms: chills, dysuria and nausea   Associated symptoms: no chest pain, no diarrhea, no fever, no hematuria, no shortness of breath and no vomiting     Past Medical History  Diagnosis Date  . Broken ribs     hx of  . Hemorrhoids   . Allergy   . Arthritis   . GERD (gastroesophageal reflux disease)   . Hypertension   . Carpal tunnel syndrome 12/27/2010   Past Surgical History  Procedure Laterality Date  . Cholecystectomy  2000   Family History  Problem Relation Age of Onset  . Arthritis Other   . Alcohol abuse Other   . Heart disease Other   . Hypertension Other   . Cancer Other   . Heart disease Other    Social History  Substance Use Topics  . Smoking status: Never Smoker   . Smokeless tobacco: None  . Alcohol Use: No   OB History    No data available     Review of Systems  Constitutional: Positive for chills. Negative for fever.  HENT: Negative for rhinorrhea.   Eyes: Negative for visual disturbance.  Respiratory: Negative for shortness of breath.   Cardiovascular: Negative for chest pain.  Gastrointestinal: Positive for nausea and abdominal pain. Negative for vomiting and diarrhea.  Genitourinary: Positive for dysuria. Negative for hematuria and decreased urine  volume.  Skin: Negative for rash.  Allergic/Immunologic: Negative for immunocompromised state.  Neurological: Negative for syncope.  Psychiatric/Behavioral: Negative for confusion.      Allergies  Clarithromycin  Home Medications   Prior to Admission medications   Medication Sig Start Date End Date Taking? Authorizing Provider  acetaminophen (TYLENOL) 650 MG CR tablet Take 650 mg by mouth every 8 (eight) hours as needed for pain.    Historical Provider, MD  aspirin 81 MG EC tablet Take 1 tablet (81 mg total) by mouth daily. Swallow whole. 11/27/12   Biagio Borg, MD  cephALEXin (KEFLEX) 500 MG capsule Take 1 capsule (500 mg total) by mouth 4 (four) times daily. 03/17/15   Biagio Borg, MD  irbesartan-hydrochlorothiazide (AVALIDE) 150-12.5 MG per tablet TAKE 2 TABLETS BY MOUTH ONCE DAILY 11/30/13   Biagio Borg, MD  traMADol Veatrice Bourbon) 50 MG tablet take 1 tablet by mouth every 6 hours if needed for pain 12/23/14   Biagio Borg, MD   BP 118/45 mmHg  Pulse 83  Temp(Src) 99 F (37.2 C) (Oral)  Resp 18  Ht 5\' 6"  (1.676 m)  Wt 267 lb (121.11 kg)  BMI 43.12 kg/m2  SpO2 97% Physical Exam  Constitutional: She is oriented to person, place, and time. She appears well-developed and well-nourished. No distress.  HENT:  Head: Normocephalic and atraumatic.  Eyes: Right eye exhibits no discharge. Left eye exhibits no discharge.  Neck: No  tracheal deviation present.  Cardiovascular: Regular rhythm.   Borderline tachycardic  Pulmonary/Chest: Effort normal and breath sounds normal. No respiratory distress.  Abdominal: Soft. She exhibits no distension. There is tenderness (rlq, llq, suprapubic). There is no rebound.  Musculoskeletal: She exhibits no edema.  Neurological: She is alert and oriented to person, place, and time.  Skin: Skin is warm and dry.  Psychiatric: She has a normal mood and affect. Her behavior is normal.    ED Course  Procedures (including critical care time) Labs  Review Labs Reviewed  LIPASE, BLOOD - Abnormal; Notable for the following:    Lipase 19 (*)    All other components within normal limits  COMPREHENSIVE METABOLIC PANEL - Abnormal; Notable for the following:    Sodium 129 (*)    Potassium 2.9 (*)    Chloride 91 (*)    Glucose, Bld 112 (*)    Calcium 8.6 (*)    Albumin 3.0 (*)    Total Bilirubin 1.5 (*)    All other components within normal limits  CBC - Abnormal; Notable for the following:    WBC 14.9 (*)    All other components within normal limits  URINALYSIS, ROUTINE W REFLEX MICROSCOPIC (NOT AT [facility])  I-STAT CG4 LACTIC ACID, ED    Imaging Review Ct Abdomen Pelvis W Contrast  03/20/2015   ADDENDUM REPORT: 03/20/2015 16:15 ADDENDUM: These results were called by telephone at the time of interpretation on 03/20/2015 at 4:14 pm to Dr. Thersa Salt , who verbally acknowledged these results. Electronically Signed   By: Jerilynn Mages.  Shick M.D.   On: 03/20/2015 16:15  03/20/2015   CLINICAL DATA:  Acute lower abdominal and pelvic pain, dysuria, rectal pain, elevated white count.  EXAM: CT ABDOMEN AND PELVIS WITH CONTRAST  TECHNIQUE: Multidetector CT imaging of the abdomen and pelvis was performed using the standard protocol following bolus administration of intravenous contrast.  CONTRAST:  128mL OMNIPAQUE IOHEXOL 300 MG/ML  SOLN  COMPARISON:  None.  FINDINGS: Lower chest: Healed right lower rib fractures with deformity. Lung bases remain clear. Normal heart size. No pericardial or pleural effusion. Degenerative changes of the lower thoracic spine. Small hiatal hernia evident.  Abdomen: Prior cholecystectomy. Small midline fat containing umbilical hernia with an abdominal defect measuring 4.5 cm, image 43. This is just above the umbilicus.  Liver, biliary system, pancreas, spleen, adrenal glands, and kidneys are within normal limits for age and demonstrate no acute process.  Negative for bowel obstruction, dilatation, ileus, or significant free air.  Retro  aortic left renal vein noted, normal variant.  Intact aorta. Negative for aneurysm. No acute retroperitoneal process.  Pelvis: Diverticulosis present of the colon. Lower sigmoid and rectum demonstrates wall thickening with surrounding edema and inflammation compatible with diverticulitis. Difficult to exclude a mural lesion. Deep within the pelvis, 2 small fluid collections are present 1 the midline measures 39 x 31 mm posterior to the uterus image 65. Left adnexal small fluid collection versus ovarian cyst measures 30 x 21 mm, image 63. Urinary bladder collapsed. Uterus is tilted to the right.  No adenopathy , inguinal abnormality, or inguinal hernia.  Degenerative changes of the lower lumbar spine and SI joints.  IMPRESSION: Acute diverticulitis of the lower sigmoid colon and rectum.  Midline pelvic small fluid collection measuring 4 x 3 cm suspicious for associated abscess  Left adnexal small fluid collection versus cystic area along the left ovary could represent a second abscess versus a small ovarian cyst measuring 30 x 21  mm.  No associated obstruction or ileus  Small fat containing ventral hernia superior to the umbilicus.  Prior cholecystectomy  Small hiatal hernia  Electronically Signed: By: Jerilynn Mages.  Shick M.D. On: 03/20/2015 15:55   I have personally reviewed and evaluated these images and lab results as part of my medical decision-making.   EKG Interpretation None      MDM   Final diagnoses:  Diverticulitis of large intestine with abscess without bleeding    64 year old female with history of hypertension, GERD presenting with abdominal pain and findings of diverticulitis on outpatient CT scan. Patient saw PCP as an outpatient for abdominal pain and dysuria, found to have UTI and placed on empiric antibiotics for this, had CT scan and was called back after outpatient scan revealed diverticulitis with associated developing abscess. AF, VSS other than borderline tachycardia. Labs notable for  hypokalemia and mild hyponatremia, leukocytosis. Lactic acidosis. We'll administer potassium, IV fluids, Cipro and Flagyl. Admitted for medical management to hospitalist. Surgery consult to evaluate patient given developing abscess.   Case discussed with Dr. Dayna Barker who oversaw management of this pt.     Ivin Booty, MD 03/21/15 1513  Merrily Pew, MD 03/22/15 [phone]

## 2015-03-20 NOTE — Telephone Encounter (Signed)
Patient informed of elevated WBC and diverticulitis with likely abscess. Advised patient to go to ED for admission.

## 2015-03-20 NOTE — ED Notes (Signed)
Pt had ct scan done today due to lower abd pain for over a week. Having nausea, denies vomiting or diarrhea. Was called to come back for admission due to diverticulitis.

## 2015-03-20 NOTE — Progress Notes (Signed)
Pt admitted from ED.  Alert and oriented.  No complaints at this time.  Oriented to room and unit.  Admitting paged to make aware of patient's arrival.  Will continue to monitor and carry out orders. Graceann Congress

## 2015-03-20 NOTE — Telephone Encounter (Signed)
[facility] - Client [facility]  Patient Name: Veronica [name]  DOB: May 27, 1951    Initial Comment caller states she has lower abdominal pain, rectal pain and painful urination   Nurse Assessment  Nurse: Wynetta Emery, RN, Baker Janus Date/Time Eilene Ghazi Time): 03/20/2015 9:48:52 AM  Confirm and document reason for call. If symptomatic, describe symptoms. ---Erline has lower abdominal pain, rectal pain and problems with urination. (frequency and pain present) last week seen in office Friday Dx bladder infection started rx forbladder infection  Has the patient traveled out of the country within the last 30 days? ---No  Does the patient require triage? ---Yes  Related visit to physician within the last 2 weeks? ---Yes   MD office Friday dx bladder infection antibiotic therapy started  Does the PT have any chronic conditions? (i.e. diabetes, asthma, etc.) ---Unknown     Guidelines    Guideline Title Affirmed Question Affirmed Notes  Abdominal Pain - Female [1] MILD-MODERATE pain AND [2] constant AND [3] present > 2 hours    Final Disposition User   See Physician within 4 Hours (or PCP triage) Wynetta Emery, RN, Baker Janus    Comments  Appt made at [facility] with [place]. Cook Dr. Ronnald Ramp not in 1115am appt today   Disagree/Comply: Comply

## 2015-03-20 NOTE — Assessment & Plan Note (Addendum)
New problem with uncertain etiology. Quite tender on exam. Recent urine culture revealed 30,000 CFU of Group B strep (symptoms unlikely to be from infection). Obtaining labs: CBC, CMP. Sending for CT scan for further evaluation.

## 2015-03-21 ENCOUNTER — Encounter (HOSPITAL_COMMUNITY): Payer: Self-pay | Admitting: General Practice

## 2015-03-21 ENCOUNTER — Other Ambulatory Visit (HOSPITAL_COMMUNITY): Payer: BC Managed Care – PPO

## 2015-03-21 DIAGNOSIS — K651 Peritoneal abscess: Secondary | ICD-10-CM

## 2015-03-21 LAB — BASIC METABOLIC PANEL
Anion gap: 6 (ref 5–15)
BUN: 7 mg/dL (ref 6–20)
CO2: 28 mmol/L (ref 22–32)
Calcium: 8.2 mg/dL — ABNORMAL LOW (ref 8.9–10.3)
Chloride: 101 mmol/L (ref 101–111)
Creatinine, Ser: 0.88 mg/dL (ref 0.44–1.00)
GFR calc Af Amer: >60 mL/min (ref >60)
GFR calc non Af Amer: >60 mL/min (ref >60)
Glucose, Bld: 91 mg/dL (ref 65–99)
Potassium: 3.7 mmol/L (ref 3.5–5.1)
Sodium: 135 mmol/L (ref 135–145)

## 2015-03-21 LAB — CBC
HCT: 36.9 % (ref 36.0–46.0)
Hemoglobin: 12.2 g/dL (ref 12.0–15.0)
MCH: 29.2 pg (ref 26.0–34.0)
MCHC: 33.1 g/dL (ref 30.0–36.0)
MCV: 88.3 fL (ref 78.0–100.0)
Platelets: 274 10*3/uL (ref 150–400)
RBC: 4.18 MIL/uL (ref 3.87–5.11)
RDW: 13 % (ref 11.5–15.5)
WBC: 13.1 10*3/uL — ABNORMAL HIGH (ref 4.0–10.5)

## 2015-03-21 LAB — URINALYSIS, ROUTINE W REFLEX MICROSCOPIC
Bilirubin Urine: NEGATIVE
Glucose, UA: NEGATIVE mg/dL
Hgb urine dipstick: NEGATIVE
Ketones, ur: NEGATIVE mg/dL
Leukocytes, UA: NEGATIVE
Nitrite: NEGATIVE
Protein, ur: NEGATIVE mg/dL
Specific Gravity, Urine: 1.015 (ref 1.005–1.030)
Urobilinogen, UA: 1 mg/dL (ref 0.0–1.0)
pH: 6 (ref 5.0–8.0)

## 2015-03-21 LAB — PROTIME-INR
INR: 1.32 (ref 0.00–1.49)
Prothrombin Time: 16.5 seconds — ABNORMAL HIGH (ref 11.6–15.2)

## 2015-03-21 LAB — APTT: aPTT: 29 seconds (ref 24–37)

## 2015-03-21 MED ORDER — METRONIDAZOLE IN NACL 5-0.79 MG/ML-% IV SOLN
500.0000 mg | Freq: Three times a day (TID) | INTRAVENOUS | Status: DC
  Administered 2015-03-21 – 2015-03-24 (×10): 500 mg via INTRAVENOUS
  Filled 2015-03-21 (×14): qty 100

## 2015-03-21 MED ORDER — CIPROFLOXACIN IN D5W 400 MG/200ML IV SOLN
400.0000 mg | Freq: Two times a day (BID) | INTRAVENOUS | Status: DC
  Filled 2015-03-21: qty 200

## 2015-03-21 MED ORDER — DEXTROSE 5 % IV SOLN
2.0000 g | INTRAVENOUS | Status: DC
  Administered 2015-03-21 – 2015-03-24 (×4): 2 g via INTRAVENOUS
  Filled 2015-03-21 (×4): qty 2

## 2015-03-21 NOTE — Progress Notes (Signed)
[facility] PROGRESS NOTE   Veronica [name] ZOX:[id number] DOB: 1950/11/07 DOA: 03/20/2015 PCP: Cathlean Cower, MD  HPI/Subjective: Feels okay, denies any complaints,. No distress.  Assessment/Plan: Principal Problem:   Diverticulitis Active Problems:   Abdominal abscess ([place])   Hyponatremia   Essential hypertension   Hypokalemia   Diverticulitis/Abd abscess:  -CT showing diverticulitis of the lower sigmoid and rectum with 4x3 cm fluid collection in the midline pelvic region suspicious for abscess.  -Surgery consulted and appreciate their input. Recommend IV antibiotics and possible IR drainage -Continue Cipro/Flagyl, continue nothing by mouth, likely can start clears after the placement of the abscess drain.  HTN:  -Patient has reasonable blood pressure controlled, continue home medications. - Hydralazine PRN  Hyponatremia: -Sodium went down to 129? Lab error as it was 136 4 hours before that. -Sodium is 135, continue IV fluids.  Hypokalemia: 2.9 -This is likely secondary to diuretics, patient is on hydrochlorothiazide, hold and replete with oral supplements.  Code Status: Full Code Family Communication: Plan discussed with the patient. Disposition Plan: Remains inpatient Diet: Diet NPO time specified  Consultants:  GEN surgery.  IR  Procedures:  None  Antibiotics:  Cipro/[place]. (indicate start date, and stop date if known)   Objective: Filed Vitals:   03/21/15 0454  BP: 126/55  Pulse: 87  Temp: 98.4 F (36.9 C)  Resp: 20    Intake/Output Summary (Last 24 hours) at 03/21/15 1251 Last data filed at 03/21/15 1134  Gross per 24 hour  Intake      0 ml  Output    200 ml  Net   -200 ml   Filed Weights   03/20/15 1722 03/20/15 2238  Weight: 121.11 kg (267 lb) 122.335 kg (269 lb 11.2 oz)    Exam: General: Alert and awake, oriented x3, not in any acute distress. HEENT: anicteric sclera, pupils reactive to light and accommodation, EOMI CVS:  S1-S2 clear, no murmur rubs or gallops Chest: clear to auscultation bilaterally, no wheezing, rales or rhonchi Abdomen: soft nontender, nondistended, normal bowel sounds, no organomegaly Extremities: no cyanosis, clubbing or edema noted bilaterally Neuro: Cranial nerves II-XII intact, no focal neurological deficits  Data Reviewed: Basic Metabolic Panel:  Recent Labs Lab 03/20/15 1315 03/20/15 1732 03/21/15 0515  NA 136 129* 135  K 2.9* 2.9* 3.7  CL 96* 91* 101  CO2 28 27 28   GLUCOSE 94 112* 91  BUN 7 8 7   CREATININE 0.83 0.93 0.88  CALCIUM 8.9 8.6* 8.2*   Liver Function Tests:  Recent Labs Lab 03/20/15 1315 03/20/15 1732  AST 25 23  ALT 22 21  ALKPHOS 81 82  BILITOT 1.6* 1.5*  PROT 8.1 7.7  ALBUMIN 3.1* 3.0*    Recent Labs Lab 03/20/15 1732  LIPASE 19*   No results for input(s): AMMONIA in the last 168 hours. CBC:  Recent Labs Lab 03/20/15 1315 03/20/15 1732 03/21/15 0515  WBC 15.2* 14.9* 13.1*  HGB 13.1 12.7 12.2  HCT 39.7 38.0 36.9  MCV 87.8 87.2 88.3  PLT 262 277 274   Cardiac Enzymes: No results for input(s): CKTOTAL, CKMB, CKMBINDEX, TROPONINI in the last 168 hours. BNP (last 3 results) No results for input(s): BNP in the last 8760 hours.  ProBNP (last 3 results) No results for input(s): PROBNP in the last 8760 hours.  CBG: No results for input(s): GLUCAP in the last 168 hours.  Micro Recent Results (from the past 240 hour(s))  Urine culture     Status: None  Collection Time: 03/17/15  4:02 PM  Result Value Ref Range Status   Colony Count 30,000 COLONIES/ML  Final   Organism ID, Bacteria GROUP B STREP (S.AGALACTIAE) ISOLATED  Final    Comment: Testing against S. agalactiae not routinely performed due to predictability of AMP/PEN/VAN susceptibility.      Studies: Ct Abdomen Pelvis W Contrast  03/20/2015   ADDENDUM REPORT: 03/20/2015 16:15 ADDENDUM: These results were called by telephone at the time of interpretation on 03/20/2015  at 4:14 pm to Dr. Thersa Salt , who verbally acknowledged these results. Electronically Signed   By: Jerilynn Mages.  Shick M.D.   On: 03/20/2015 16:15  03/20/2015   CLINICAL DATA:  Acute lower abdominal and pelvic pain, dysuria, rectal pain, elevated white count.  EXAM: CT ABDOMEN AND PELVIS WITH CONTRAST  TECHNIQUE: Multidetector CT imaging of the abdomen and pelvis was performed using the standard protocol following bolus administration of intravenous contrast.  CONTRAST:  165mL OMNIPAQUE IOHEXOL 300 MG/ML  SOLN  COMPARISON:  None.  FINDINGS: Lower chest: Healed right lower rib fractures with deformity. Lung bases remain clear. Normal heart size. No pericardial or pleural effusion. Degenerative changes of the lower thoracic spine. Small hiatal hernia evident.  Abdomen: Prior cholecystectomy. Small midline fat containing umbilical hernia with an abdominal defect measuring 4.5 cm, image 43. This is just above the umbilicus.  Liver, biliary system, pancreas, spleen, adrenal glands, and kidneys are within normal limits for age and demonstrate no acute process.  Negative for bowel obstruction, dilatation, ileus, or significant free air.  Retro aortic left renal vein noted, normal variant.  Intact aorta. Negative for aneurysm. No acute retroperitoneal process.  Pelvis: Diverticulosis present of the colon. Lower sigmoid and rectum demonstrates wall thickening with surrounding edema and inflammation compatible with diverticulitis. Difficult to exclude a mural lesion. Deep within the pelvis, 2 small fluid collections are present 1 the midline measures 39 x 31 mm posterior to the uterus image 65. Left adnexal small fluid collection versus ovarian cyst measures 30 x 21 mm, image 63. Urinary bladder collapsed. Uterus is tilted to the right.  No adenopathy , inguinal abnormality, or inguinal hernia.  Degenerative changes of the lower lumbar spine and SI joints.  IMPRESSION: Acute diverticulitis of the lower sigmoid colon and rectum.   Midline pelvic small fluid collection measuring 4 x 3 cm suspicious for associated abscess  Left adnexal small fluid collection versus cystic area along the left ovary could represent a second abscess versus a small ovarian cyst measuring 30 x 21 mm.  No associated obstruction or ileus  Small fat containing ventral hernia superior to the umbilicus.  Prior cholecystectomy  Small hiatal hernia  Electronically Signed: By: Jerilynn Mages.  Shick M.D. On: 03/20/2015 15:55    Scheduled Meds: . aspirin EC  81 mg Oral Daily  . cefTRIAXone (ROCEPHIN)  IV  2 g Intravenous Q24H  . heparin  5,000 Units Subcutaneous 3 times per day  . hydrochlorothiazide  12.5 mg Oral Daily  .  HYDROmorphone (DILAUDID) injection  1 mg Intravenous Once  . irbesartan  150 mg Oral Daily  . metronidazole  500 mg Intravenous Q8H  . sodium chloride  3 mL Intravenous Q12H   Continuous Infusions: . sodium chloride 125 mL/hr at 03/21/15 0817       Time spent: 35 minutes    [facility]  [facility] Pager [phone] If 7PM-7AM, please contact night-coverage at [email or web address], password [facility] 03/21/2015, 12:51 PM  LOS: 1 day

## 2015-03-21 NOTE — Progress Notes (Signed)
Patient ID: Veronica [name], female   DOB: 1950-07-09, 64 y.o.   MRN: [id number]     [facility]      [address] [place], [place] [address]    Phone: [phone] FAX: [phone]     Subjective: Overall pain is better, intermittent cramping, rectal pressure.  Constipated.  No n/v. Afebrile.  WBC down.   Objective:  Vital signs:  Filed Vitals:   03/20/15 2145 03/20/15 2215 03/20/15 2238 03/21/15 0454  BP: 119/60 123/61 131/72 126/55  Pulse: 92 86 88 87  Temp:   98.1 F (36.7 C) 98.4 F (36.9 C)  TempSrc:   Oral Oral  Resp:   19 20  Height:   '5\' 6"'$  (1.676 m)   Weight:   122.335 kg (269 lb 11.2 oz)   SpO2: 99% 97% 97% 99%    Last BM Date: 03/19/15 ("first one since last tuesday", "soft but formed")  Intake/Output   Yesterday:    This shift:      Physical Exam: General: Pt awake/alert/oriented x4 in no acute distress  Abdomen: Soft.  Nondistended.  Tender to lower abdomen most appreciated in the suprapubic region.  No evidence of peritonitis.  No incarcerated hernias.    Problem List:   Principal Problem:   Diverticulitis Active Problems:   Abdominal abscess ([place])   Hyponatremia   Essential hypertension   Hypokalemia    Results:   Labs: Results for orders placed or performed during the hospital encounter of 03/20/15 (from the past 48 hour(s))  Lipase, blood     Status: Abnormal   Collection Time: 03/20/15  5:32 PM  Result Value Ref Range   Lipase 19 (L) 22 - 51 U/L  Comprehensive metabolic panel     Status: Abnormal   Collection Time: 03/20/15  5:32 PM  Result Value Ref Range   Sodium 129 (L) 135 - 145 mmol/L    Comment: DELTA CHECK NOTED   Potassium 2.9 (L) 3.5 - 5.1 mmol/L   Chloride 91 (L) 101 - 111 mmol/L   CO2 27 22 - 32 mmol/L   Glucose, Bld 112 (H) 65 - 99 mg/dL   BUN 8 6 - 20 mg/dL   Creatinine, Ser 0.93 0.44 - 1.00 mg/dL   Calcium 8.6 (L) 8.9 - 10.3 mg/dL   Total Protein 7.7 6.5 -  8.1 g/dL   Albumin 3.0 (L) 3.5 - 5.0 g/dL   AST 23 15 - 41 U/L   ALT 21 14 - 54 U/L   Alkaline Phosphatase 82 38 - 126 U/L   Total Bilirubin 1.5 (H) 0.3 - 1.2 mg/dL   GFR calc non Af Amer >60 >60 mL/min   GFR calc Af Amer >60 >60 mL/min    Comment: (NOTE) The eGFR has been calculated using the CKD EPI equation. This calculation has not been validated in all clinical situations. eGFR's persistently <60 mL/min signify possible Chronic Kidney Disease.    Anion gap 11 5 - 15  CBC     Status: Abnormal   Collection Time: 03/20/15  5:32 PM  Result Value Ref Range   WBC 14.9 (H) 4.0 - 10.5 K/uL   RBC 4.36 3.87 - 5.11 MIL/uL   Hemoglobin 12.7 12.0 - 15.0 g/dL   HCT 38.0 36.0 - 46.0 %   MCV 87.2 78.0 - 100.0 fL   MCH 29.1 26.0 - 34.0 pg   MCHC 33.4 30.0 - 36.0 g/dL   RDW 12.7 11.5 -  15.5 %   Platelets 277 150 - 400 K/uL  I-Stat CG4 Lactic Acid, ED     Status: None   Collection Time: 03/20/15  8:35 PM  Result Value Ref Range   Lactic Acid, Venous 1.27 0.5 - 2.0 mmol/L  CBC     Status: Abnormal   Collection Time: 03/21/15  5:15 AM  Result Value Ref Range   WBC 13.1 (H) 4.0 - 10.5 K/uL   RBC 4.18 3.87 - 5.11 MIL/uL   Hemoglobin 12.2 12.0 - 15.0 g/dL   HCT 24.4 97.5 - 30.0 %   MCV 88.3 78.0 - 100.0 fL   MCH 29.2 26.0 - 34.0 pg   MCHC 33.1 30.0 - 36.0 g/dL   RDW 51.1 02.1 - 11.7 %   Platelets 274 150 - 400 K/uL  Basic metabolic panel     Status: Abnormal   Collection Time: 03/21/15  5:15 AM  Result Value Ref Range   Sodium 135 135 - 145 mmol/L   Potassium 3.7 3.5 - 5.1 mmol/L    Comment: DELTA CHECK NOTED   Chloride 101 101 - 111 mmol/L   CO2 28 22 - 32 mmol/L   Glucose, Bld 91 65 - 99 mg/dL   BUN 7 6 - 20 mg/dL   Creatinine, Ser 3.56 0.44 - 1.00 mg/dL   Calcium 8.2 (L) 8.9 - 10.3 mg/dL   GFR calc non Af Amer >60 >60 mL/min   GFR calc Af Amer >60 >60 mL/min    Comment: (NOTE) The eGFR has been calculated using the CKD EPI equation. This calculation has not been validated  in all clinical situations. eGFR's persistently <60 mL/min signify possible Chronic Kidney Disease.    Anion gap 6 5 - 15  Protime-INR     Status: Abnormal   Collection Time: 03/21/15  5:15 AM  Result Value Ref Range   Prothrombin Time 16.5 (H) 11.6 - 15.2 seconds   INR 1.32 0.00 - 1.49  APTT     Status: None   Collection Time: 03/21/15  5:15 AM  Result Value Ref Range   aPTT 29 24 - 37 seconds    Imaging / Studies: Ct Abdomen Pelvis W Contrast  03/20/2015   ADDENDUM REPORT: 03/20/2015 16:15 ADDENDUM: These results were called by telephone at the time of interpretation on 03/20/2015 at 4:14 pm to Dr. Everlene Other , who verbally acknowledged these results. Electronically Signed   By: Judie Petit.  Shick M.D.   On: 03/20/2015 16:15  03/20/2015   CLINICAL DATA:  Acute lower abdominal and pelvic pain, dysuria, rectal pain, elevated white count.  EXAM: CT ABDOMEN AND PELVIS WITH CONTRAST  TECHNIQUE: Multidetector CT imaging of the abdomen and pelvis was performed using the standard protocol following bolus administration of intravenous contrast.  CONTRAST:  OMNIPAQUE IOHEXOL 300 MG/ML  SOLN  COMPARISON:  None.  FINDINGS: Lower chest: Healed right lower rib fractures with deformity. Lung bases remain clear. Normal heart size. No pericardial or pleural effusion. Degenerative changes of the lower thoracic spine. Small hiatal hernia evident.  Abdomen: Prior cholecystectomy. Small midline fat containing umbilical hernia with an abdominal defect measuring 4.5 cm, image 43. This is just above the umbilicus.  Liver, biliary system, pancreas, spleen, adrenal glands, and kidneys are within normal limits for age and demonstrate no acute process.  Negative for bowel obstruction, dilatation, ileus, or significant free air.  Retro aortic left renal vein noted, normal variant.  Intact aorta. Negative for aneurysm. No acute retroperitoneal process.  Pelvis: Diverticulosis present of the colon. Lower sigmoid and rectum  demonstrates wall thickening with surrounding edema and inflammation compatible with diverticulitis. Difficult to exclude a mural lesion. Deep within the pelvis, 2 small fluid collections are present 1 the midline measures 39 x 31 mm posterior to the uterus image 65. Left adnexal small fluid collection versus ovarian cyst measures 30 x 21 mm, image 63. Urinary bladder collapsed. Uterus is tilted to the right.  No adenopathy , inguinal abnormality, or inguinal hernia.  Degenerative changes of the lower lumbar spine and SI joints.  IMPRESSION: Acute diverticulitis of the lower sigmoid colon and rectum.  Midline pelvic small fluid collection measuring 4 x 3 cm suspicious for associated abscess  Left adnexal small fluid collection versus cystic area along the left ovary could represent a second abscess versus a small ovarian cyst measuring 30 x 21 mm.  No associated obstruction or ileus  Small fat containing ventral hernia superior to the umbilicus.  Prior cholecystectomy  Small hiatal hernia  Electronically Signed: By: Jerilynn Mages.  Shick M.D. On: 03/20/2015 15:55    Medications / Allergies:  Scheduled Meds: . aspirin EC  81 mg Oral Daily  . cefTRIAXone (ROCEPHIN)  IV  2 g Intravenous Q24H  . heparin  5,000 Units Subcutaneous 3 times per day  . hydrochlorothiazide  12.5 mg Oral Daily  .  HYDROmorphone (DILAUDID) injection  1 mg Intravenous Once  . irbesartan  150 mg Oral Daily  . metronidazole  500 mg Intravenous Q8H  . sodium chloride  3 mL Intravenous Q12H   Continuous Infusions: . sodium chloride 125 mL/hr at 03/21/15 0051   PRN Meds:.hydrALAZINE, morphine injection, ondansetron (ZOFRAN) IV  Antibiotics: Anti-infectives    Start     Dose/Rate Route Frequency Ordered Stop   03/21/15 1000  ciprofloxacin (CIPRO) IVPB 400 mg  Status:  Discontinued     400 mg 200 mL/hr over 60 Minutes Intravenous Every 12 hours 03/21/15 0245 03/21/15 0714   03/21/15 0730  cefTRIAXone (ROCEPHIN) 2 g in dextrose 5 % 50 mL  IVPB    Comments:  Pharmacy may adjust dosing strength / duration / interval for maximal efficacy   2 g 100 mL/hr over 30 Minutes Intravenous Every 24 hours 03/21/15 0715     03/21/15 0600  metroNIDAZOLE (FLAGYL) IVPB 500 mg     500 mg 100 mL/hr over 60 Minutes Intravenous Every 8 hours 03/21/15 0245     03/20/15 2030  ciprofloxacin (CIPRO) IVPB 400 mg     400 mg 200 mL/hr over 60 Minutes Intravenous  Once 03/20/15 2022 03/20/15 2219   03/20/15 2030  metroNIDAZOLE (FLAGYL) IVPB 500 mg     500 mg 100 mL/hr over 60 Minutes Intravenous  Once 03/20/15 2022 03/20/15 2325        Assessment/Plan Acute sigmoid diverticulitis with abscess-IR to eval for aspiration/drain.  Colonoscopy 08/2014 negative.  Anticipate she will improve with non operative management. ID-change cipro to rocephin flagyl D#1.  CBC in AM.  FEN-NPO, IVF, pain control VTE prophylaxis -SCD/heparin  Erby Pian, ANP-BC [facility] Pager [phone](7A-4:30P) For consults and floor pages call [phone](7A-4:30P)  03/21/2015 7:49 AM

## 2015-03-22 LAB — CBC
HCT: 33.2 % — ABNORMAL LOW (ref 36.0–46.0)
Hemoglobin: 11.2 g/dL — ABNORMAL LOW (ref 12.0–15.0)
MCH: 29.7 pg (ref 26.0–34.0)
MCHC: 33.7 g/dL (ref 30.0–36.0)
MCV: 88.1 fL (ref 78.0–100.0)
Platelets: 265 10*3/uL (ref 150–400)
RBC: 3.77 MIL/uL — ABNORMAL LOW (ref 3.87–5.11)
RDW: 13 % (ref 11.5–15.5)
WBC: 13.5 10*3/uL — ABNORMAL HIGH (ref 4.0–10.5)

## 2015-03-22 MED ORDER — HYDROCODONE-ACETAMINOPHEN 5-325 MG PO TABS
1.0000 | ORAL_TABLET | ORAL | Status: DC | PRN
  Administered 2015-03-22 – 2015-03-30 (×25): 2 via ORAL
  Filled 2015-03-22 (×26): qty 2

## 2015-03-22 MED ORDER — MORPHINE SULFATE (PF) 4 MG/ML IV SOLN
4.0000 mg | INTRAVENOUS | Status: DC | PRN
  Administered 2015-03-25 – 2015-03-28 (×5): 4 mg via INTRAVENOUS
  Filled 2015-03-22 (×5): qty 1

## 2015-03-22 MED ORDER — ACETAMINOPHEN 325 MG PO TABS: 325.0000 mg | ORAL_TABLET | Freq: Four times a day (QID) | ORAL | Status: DC | PRN

## 2015-03-22 NOTE — Progress Notes (Signed)
[facility]                                                                              Patient Demographics  Veronica [name], is a 64 y.o. female, DOB - 01/08/1951, [place] date - 03/20/2015   Admitting Physician Waldemar Dickens, MD  Outpatient Primary MD for the patient is Cathlean Cower, MD  LOS - 2   Chief Complaint  Patient presents with  . Abdominal Pain      HPI on 03/20/2015 by Dr. Linna Darner  Veronica [name] is a 64 y.o. female  Abdominal pain. Started 1 week ago. Getting worse. Comes and goes. Left lower quadrant initially but now more generalized. Associated with watery diarrhea. Denies bloody bowel movements, vomiting, fevers, dysuria, frequency. Pain is worse with meals. Some improvement after trying Dulcolax couple of days ago.  Assessment & Plan   Acute sigmoid diverticulitis/Abd abscess:  -CT showing diverticulitis of the lower sigmoid and rectum with 4x3 cm fluid collection in the midline pelvic region suspicious for abscess.  -General surgery consulted and appreciated; Recommend IV antibiotics and possible IR drainage -Abscess not amendable to drainage  -Continue Cipro/Flagyl -Continue conservative management, antibiotics, pain control, IV fluids -Patient tolerating clears  Hypertension -Patient has reasonable blood pressure controlled, continue home medications. -Hydralazine PRN  Hyponatremia -Sodium was 129, repeated 135 -Continue to monitor BMP and IV fluids  Hypokalemia -Likely secondary to diuretics -Resolved, continue to monitor BMP  Code Status: Full  Family Communication: None at bedside  Disposition Plan: Admitted.  Suspect discharge within 24-48 hours  Time Spent in minutes   30 minutes  Procedures  None  Consults   General surgery Interventional radiology  DVT Prophylaxis  heparin  Lab Results  Component Value Date   PLT 265 03/22/2015    Medications  Scheduled Meds: . aspirin EC  81 mg Oral  Daily  . cefTRIAXone (ROCEPHIN)  IV  2 g Intravenous Q24H  . heparin  5,000 Units Subcutaneous 3 times per day  . hydrochlorothiazide  12.5 mg Oral Daily  .  HYDROmorphone (DILAUDID) injection  1 mg Intravenous Once  . irbesartan  150 mg Oral Daily  . metronidazole  500 mg Intravenous Q8H  . sodium chloride  3 mL Intravenous Q12H   Continuous Infusions: . sodium chloride 125 mL/hr at 03/22/15 0352   PRN Meds:.acetaminophen, hydrALAZINE, HYDROcodone-acetaminophen, morphine injection, ondansetron (ZOFRAN) IV  Antibiotics    Anti-infectives    Start     Dose/Rate Route Frequency Ordered Stop   03/21/15 1000  ciprofloxacin (CIPRO) IVPB 400 mg  Status:  Discontinued     400 mg 200 mL/hr over 60 Minutes Intravenous Every 12 hours 03/21/15 0245 03/21/15 0714   03/21/15 0730  cefTRIAXone (ROCEPHIN) 2 g in dextrose 5 % 50 mL IVPB    Comments:  Pharmacy may adjust dosing strength / duration / interval for maximal efficacy   2 g 100 mL/hr over 30 Minutes Intravenous Every 24 hours 03/21/15 0715     03/21/15 0600  metroNIDAZOLE (FLAGYL) IVPB 500 mg     500 mg 100 mL/hr over 60 Minutes Intravenous Every 8 hours 03/21/15 0245  03/20/15 2030  ciprofloxacin (CIPRO) IVPB 400 mg     400 mg 200 mL/hr over 60 Minutes Intravenous  Once 03/20/15 2022 03/20/15 2219   03/20/15 2030  metroNIDAZOLE (FLAGYL) IVPB 500 mg     500 mg 100 mL/hr over 60 Minutes Intravenous  Once 03/20/15 2022 03/22/15 0809      Subjective:   Orvella Frith seen and examined today.  Patient states she is feeling better than when she first came in. Denies any nausea, vomiting, diarrhea. Admits to having a bowel movement. Denies chest pain, shortness of breath, dizziness or headache.  Objective:   Filed Vitals:   03/21/15 0454 03/21/15 1503 03/21/15 2045 03/22/15 0500  BP: 126/55 133/56 133/62 122/65  Pulse: 87 81 93 92  Temp: 98.4 F (36.9 C) 98.3 F (36.8 C) 98.4 F (36.9 C) 99.1 F (37.3 C)  TempSrc: Oral  Oral Oral Oral  Resp: 20 19 19 18   Height:      Weight:      SpO2: 99% 97% 94% 91%    Wt Readings from Last 3 Encounters:  03/20/15 122.335 kg (269 lb 11.2 oz)  03/20/15 121.11 kg (267 lb)  03/17/15 121.564 kg (268 lb)     Intake/Output Summary (Last 24 hours) at 03/22/15 1308 Last data filed at 03/21/15 1825  Gross per 24 hour  Intake      0 ml  Output      0 ml  Net      0 ml    Exam  General: Well developed, well nourished, NAD, appears stated age  HEENT: NCAT,  mucous membranes moist.   Cardiovascular: S1 S2 auscultated, no rubs, murmurs or gallops. Regular rate and rhythm.  Respiratory: Clear to auscultation bilaterally with equal chest rise  Abdomen: Soft, RLQ and suprapubic TTP, nondistended, +BS.  Extremities: warm dry without cyanosis clubbing or edema  Neuro: AAOx3, nonfocal  Psych: Normal affect and demeanor   Data Review   Micro Results Recent Results (from the past 240 hour(s))  Urine culture     Status: None   Collection Time: 03/17/15  4:02 PM  Result Value Ref Range Status   Colony Count 30,000 COLONIES/ML  Final   Organism ID, Bacteria GROUP B STREP (S.AGALACTIAE) ISOLATED  Final    Comment: Testing against S. agalactiae not routinely performed due to predictability of AMP/PEN/VAN susceptibility.     Radiology Reports Ct Abdomen Pelvis W Contrast  03/20/2015   ADDENDUM REPORT: 03/20/2015 16:15 ADDENDUM: These results were called by telephone at the time of interpretation on 03/20/2015 at 4:14 pm to Dr. Thersa Salt , who verbally acknowledged these results. Electronically Signed   By: Jerilynn Mages.  Shick M.D.   On: 03/20/2015 16:15  03/20/2015   CLINICAL DATA:  Acute lower abdominal and pelvic pain, dysuria, rectal pain, elevated white count.  EXAM: CT ABDOMEN AND PELVIS WITH CONTRAST  TECHNIQUE: Multidetector CT imaging of the abdomen and pelvis was performed using the standard protocol following bolus administration of intravenous contrast.  CONTRAST:   136mL OMNIPAQUE IOHEXOL 300 MG/ML  SOLN  COMPARISON:  None.  FINDINGS: Lower chest: Healed right lower rib fractures with deformity. Lung bases remain clear. Normal heart size. No pericardial or pleural effusion. Degenerative changes of the lower thoracic spine. Small hiatal hernia evident.  Abdomen: Prior cholecystectomy. Small midline fat containing umbilical hernia with an abdominal defect measuring 4.5 cm, image 43. This is just above the umbilicus.  Liver, biliary system, pancreas, spleen, adrenal glands, and kidneys are  within normal limits for age and demonstrate no acute process.  Negative for bowel obstruction, dilatation, ileus, or significant free air.  Retro aortic left renal vein noted, normal variant.  Intact aorta. Negative for aneurysm. No acute retroperitoneal process.  Pelvis: Diverticulosis present of the colon. Lower sigmoid and rectum demonstrates wall thickening with surrounding edema and inflammation compatible with diverticulitis. Difficult to exclude a mural lesion. Deep within the pelvis, 2 small fluid collections are present 1 the midline measures 39 x 31 mm posterior to the uterus image 65. Left adnexal small fluid collection versus ovarian cyst measures 30 x 21 mm, image 63. Urinary bladder collapsed. Uterus is tilted to the right.  No adenopathy , inguinal abnormality, or inguinal hernia.  Degenerative changes of the lower lumbar spine and SI joints.  IMPRESSION: Acute diverticulitis of the lower sigmoid colon and rectum.  Midline pelvic small fluid collection measuring 4 x 3 cm suspicious for associated abscess  Left adnexal small fluid collection versus cystic area along the left ovary could represent a second abscess versus a small ovarian cyst measuring 30 x 21 mm.  No associated obstruction or ileus  Small fat containing ventral hernia superior to the umbilicus.  Prior cholecystectomy  Small hiatal hernia  Electronically Signed: By: Jerilynn Mages.  Shick M.D. On: 03/20/2015 15:55     CBC  Recent Labs Lab 03/20/15 1315 03/20/15 1732 03/21/15 0515 03/22/15 0520  WBC 15.2* 14.9* 13.1* 13.5*  HGB 13.1 12.7 12.2 11.2*  HCT 39.7 38.0 36.9 33.2*  PLT 262 277 274 265  MCV 87.8 87.2 88.3 88.1  MCH 29.0 29.1 29.2 29.7  MCHC 33.0 33.4 33.1 33.7  RDW 12.7 12.7 13.0 13.0    Chemistries   Recent Labs Lab 03/20/15 1315 03/20/15 1732 03/21/15 0515  NA 136 129* 135  K 2.9* 2.9* 3.7  CL 96* 91* 101  CO2 28 27 28   GLUCOSE 94 112* 91  BUN 7 8 7   CREATININE 0.83 0.93 0.88  CALCIUM 8.9 8.6* 8.2*  AST 25 23  --   ALT 22 21  --   ALKPHOS 81 82  --   BILITOT 1.6* 1.5*  --    ------------------------------------------------------------------------------------------------------------------ estimated creatinine clearance is 85 mL/min (by C-G formula based on Cr of 0.88). ------------------------------------------------------------------------------------------------------------------ No results for input(s): HGBA1C in the last 72 hours. ------------------------------------------------------------------------------------------------------------------ No results for input(s): CHOL, HDL, LDLCALC, TRIG, CHOLHDL, LDLDIRECT in the last 72 hours. ------------------------------------------------------------------------------------------------------------------ No results for input(s): TSH, T4TOTAL, T3FREE, THYROIDAB in the last 72 hours.  Invalid input(s): FREET3 ------------------------------------------------------------------------------------------------------------------ No results for input(s): VITAMINB12, FOLATE, FERRITIN, TIBC, IRON, RETICCTPCT in the last 72 hours.  Coagulation profile  Recent Labs Lab 03/21/15 0515  INR 1.32    No results for input(s): DDIMER in the last 72 hours.  Cardiac Enzymes No results for input(s): CKMB, TROPONINI, MYOGLOBIN in the last 168 hours.  Invalid input(s):  CK ------------------------------------------------------------------------------------------------------------------ Invalid input(s): POCBNP    Kanisha Duba D.O. on 03/22/2015 at 1:08 PM  Between 7am to 7pm - Pager - [phone]  After 7pm go to [email or web address] - password [email or web address]  And look for the night coverage person covering for me after hours  [facility] Office  [phone]

## 2015-03-22 NOTE — Progress Notes (Signed)
Patient ID: Veronica [name], female   DOB: 12/14/1950, 63 y.o.   MRN: [id number]     [facility]      [address]   [place], [place] [address]    Phone: [phone] FAX: [phone]     Subjective: No n/v. Had a BM.  Pain improved.  WBC up from 13.1 to 13.5k.  Afebrile.   Objective:  Vital signs:  Filed Vitals:   03/21/15 0454 03/21/15 1503 03/21/15 2045 03/22/15 0500  BP: 126/55 133/56 133/62 122/65  Pulse: 87 81 93 92  Temp: 98.4 F (36.9 C) 98.3 F (36.8 C) 98.4 F (36.9 C) 99.1 F (37.3 C)  TempSrc: Oral Oral Oral Oral  Resp: 20 19 19 18  Height:      Weight:      SpO2: 99% 97% 94% 91%    Last BM Date: 03/19/15  Intake/Output   Yesterday:  10/04 0701 - 10/05 0700 In: 0  Out: 200 [Urine:200] This shift:    I/O last 3 completed shifts: In: 0  Out: 200 [Urine:200]   Physical Exam: General: Pt awake/alert/oriented x4 in no acute distress Abdomen: Soft.  Nondistended. Mild tenderness RLQ and suprapubic region.  No evidence of peritonitis.  No incarcerated hernias.    Problem List:   Principal Problem:   Diverticulitis Active Problems:   Abdominal abscess (HCC)   Hyponatremia   Essential hypertension   Hypokalemia    Results:   Labs: Results for orders placed or performed during the hospital encounter of 03/20/15 (from the past 48 hour(s))  Lipase, blood     Status: Abnormal   Collection Time: 03/20/15  5:32 PM  Result Value Ref Range   Lipase 19 (L) 22 - 51 U/L  Comprehensive metabolic panel     Status: Abnormal   Collection Time: 03/20/15  5:32 PM  Result Value Ref Range   Sodium 129 (L) 135 - 145 mmol/L    Comment: DELTA CHECK NOTED   Potassium 2.9 (L) 3.5 - 5.1 mmol/L   Chloride 91 (L) 101 - 111 mmol/L   CO2 27 22 - 32 mmol/L   Glucose, Bld 112 (H) 65 - 99 mg/dL   BUN 8 6 - 20 mg/dL   Creatinine, Ser 0.93 0.44 - 1.00 mg/dL   Calcium 8.6 (L) 8.9 - 10.3 mg/dL   Total Protein 7.7 6.5 -  8.1 g/dL   Albumin 3.0 (L) 3.5 - 5.0 g/dL   AST 23 15 - 41 U/L   ALT 21 14 - 54 U/L   Alkaline Phosphatase 82 38 - 126 U/L   Total Bilirubin 1.5 (H) 0.3 - 1.2 mg/dL   GFR calc non Af Amer >60 >60 mL/min   GFR calc Af Amer >60 >60 mL/min    Comment: (NOTE) The eGFR has been calculated using the CKD EPI equation. This calculation has not been validated in all clinical situations. eGFR's persistently <60 mL/min signify possible Chronic Kidney Disease.    Anion gap 11 5 - 15  CBC     Status: Abnormal   Collection Time: 03/20/15  5:32 PM  Result Value Ref Range   WBC 14.9 (H) 4.0 - 10.5 K/uL   RBC 4.36 3.87 - 5.11 MIL/uL   Hemoglobin 12.7 12.0 - 15.0 g/dL   HCT 38.0 36.0 - 46.0 %   MCV 87.2 78.0 - 100.0 fL   MCH 29.1 26.0 - 34.0 pg   MCHC 33.4 30.0 - 36.0 g/dL     RDW 12.7 11.5 - 15.5 %   Platelets 277 150 - 400 K/uL  I-Stat CG4 Lactic Acid, ED     Status: None   Collection Time: 03/20/15  8:35 PM  Result Value Ref Range   Lactic Acid, Venous 1.27 0.5 - 2.0 mmol/L  CBC     Status: Abnormal   Collection Time: 03/21/15  5:15 AM  Result Value Ref Range   WBC 13.1 (H) 4.0 - 10.5 K/uL   RBC 4.18 3.87 - 5.11 MIL/uL   Hemoglobin 12.2 12.0 - 15.0 g/dL   HCT 36.9 36.0 - 46.0 %   MCV 88.3 78.0 - 100.0 fL   MCH 29.2 26.0 - 34.0 pg   MCHC 33.1 30.0 - 36.0 g/dL   RDW 13.0 11.5 - 15.5 %   Platelets 274 150 - 400 K/uL  Basic metabolic panel     Status: Abnormal   Collection Time: 03/21/15  5:15 AM  Result Value Ref Range   Sodium 135 135 - 145 mmol/L   Potassium 3.7 3.5 - 5.1 mmol/L    Comment: DELTA CHECK NOTED   Chloride 101 101 - 111 mmol/L   CO2 28 22 - 32 mmol/L   Glucose, Bld 91 65 - 99 mg/dL   BUN 7 6 - 20 mg/dL   Creatinine, Ser 0.88 0.44 - 1.00 mg/dL   Calcium 8.2 (L) 8.9 - 10.3 mg/dL   GFR calc non Af Amer >60 >60 mL/min   GFR calc Af Amer >60 >60 mL/min    Comment: (NOTE) The eGFR has been calculated using the CKD EPI equation. This calculation has not been validated  in all clinical situations. eGFR's persistently <60 mL/min signify possible Chronic Kidney Disease.    Anion gap 6 5 - 15  Protime-INR     Status: Abnormal   Collection Time: 03/21/15  5:15 AM  Result Value Ref Range   Prothrombin Time 16.5 (H) 11.6 - 15.2 seconds   INR 1.32 0.00 - 1.49  APTT     Status: None   Collection Time: 03/21/15  5:15 AM  Result Value Ref Range   aPTT 29 24 - 37 seconds  Urinalysis, Routine w reflex microscopic (not at [facility])     Status: None   Collection Time: 03/21/15  9:05 AM  Result Value Ref Range   Color, Urine YELLOW YELLOW   APPearance CLEAR CLEAR   Specific Gravity, Urine 1.015 1.005 - 1.030   pH 6.0 5.0 - 8.0   Glucose, UA NEGATIVE NEGATIVE mg/dL   Hgb urine dipstick NEGATIVE NEGATIVE   Bilirubin Urine NEGATIVE NEGATIVE   Ketones, ur NEGATIVE NEGATIVE mg/dL   Protein, ur NEGATIVE NEGATIVE mg/dL   Urobilinogen, UA 1.0 0.0 - 1.0 mg/dL   Nitrite NEGATIVE NEGATIVE   Leukocytes, UA NEGATIVE NEGATIVE    Comment: MICROSCOPIC NOT DONE ON URINES WITH NEGATIVE PROTEIN, BLOOD, LEUKOCYTES, NITRITE, OR GLUCOSE <1000 mg/dL.  CBC     Status: Abnormal   Collection Time: 03/22/15  5:20 AM  Result Value Ref Range   WBC 13.5 (H) 4.0 - 10.5 K/uL    Comment: WHITE COUNT CONFIRMED ON SMEAR   RBC 3.77 (L) 3.87 - 5.11 MIL/uL   Hemoglobin 11.2 (L) 12.0 - 15.0 g/dL   HCT 33.2 (L) 36.0 - 46.0 %   MCV 88.1 78.0 - 100.0 fL   MCH 29.7 26.0 - 34.0 pg   MCHC 33.7 30.0 - 36.0 g/dL   RDW 13.0 11.5 - 15.5 %   Platelets 265   150 - 400 K/uL    Comment: PLATELET COUNT CONFIRMED BY SMEAR    Imaging / Studies: Ct Abdomen Pelvis W Contrast  03/20/2015   ADDENDUM REPORT: 03/20/2015 16:15 ADDENDUM: These results were called by telephone at the time of interpretation on 03/20/2015 at 4:14 pm to Dr. Thersa Salt , who verbally acknowledged these results. Electronically Signed   By: Jerilynn Mages.  Shick M.D.   On: 03/20/2015 16:15  03/20/2015   CLINICAL DATA:  Acute lower abdominal and pelvic  pain, dysuria, rectal pain, elevated white count.  EXAM: CT ABDOMEN AND PELVIS WITH CONTRAST  TECHNIQUE: Multidetector CT imaging of the abdomen and pelvis was performed using the standard protocol following bolus administration of intravenous contrast.  CONTRAST:  16m OMNIPAQUE IOHEXOL 300 MG/ML  SOLN  COMPARISON:  None.  FINDINGS: Lower chest: Healed right lower rib fractures with deformity. Lung bases remain clear. Normal heart size. No pericardial or pleural effusion. Degenerative changes of the lower thoracic spine. Small hiatal hernia evident.  Abdomen: Prior cholecystectomy. Small midline fat containing umbilical hernia with an abdominal defect measuring 4.5 cm, image 43. This is just above the umbilicus.  Liver, biliary system, pancreas, spleen, adrenal glands, and kidneys are within normal limits for age and demonstrate no acute process.  Negative for bowel obstruction, dilatation, ileus, or significant free air.  Retro aortic left renal vein noted, normal variant.  Intact aorta. Negative for aneurysm. No acute retroperitoneal process.  Pelvis: Diverticulosis present of the colon. Lower sigmoid and rectum demonstrates wall thickening with surrounding edema and inflammation compatible with diverticulitis. Difficult to exclude a mural lesion. Deep within the pelvis, 2 small fluid collections are present 1 the midline measures 39 x 31 mm posterior to the uterus image 65. Left adnexal small fluid collection versus ovarian cyst measures 30 x 21 mm, image 63. Urinary bladder collapsed. Uterus is tilted to the right.  No adenopathy , inguinal abnormality, or inguinal hernia.  Degenerative changes of the lower lumbar spine and SI joints.  IMPRESSION: Acute diverticulitis of the lower sigmoid colon and rectum.  Midline pelvic small fluid collection measuring 4 x 3 cm suspicious for associated abscess  Left adnexal small fluid collection versus cystic area along the left ovary could represent a second abscess  versus a small ovarian cyst measuring 30 x 21 mm.  No associated obstruction or ileus  Small fat containing ventral hernia superior to the umbilicus.  Prior cholecystectomy  Small hiatal hernia  Electronically Signed: By: MJerilynn Mages  Shick M.D. On: 03/20/2015 15:55    Medications / Allergies:  Scheduled Meds: . aspirin EC  81 mg Oral Daily  . cefTRIAXone (ROCEPHIN)  IV  2 g Intravenous Q24H  . heparin  5,000 Units Subcutaneous 3 times per day  . hydrochlorothiazide  12.5 mg Oral Daily  .  HYDROmorphone (DILAUDID) injection  1 mg Intravenous Once  . irbesartan  150 mg Oral Daily  . metronidazole  500 mg Intravenous Q8H  . sodium chloride  3 mL Intravenous Q12H   Continuous Infusions: . sodium chloride 125 mL/hr at 03/22/15 0352   PRN Meds:.hydrALAZINE, morphine injection, ondansetron (ZOFRAN) IV  Antibiotics: Anti-infectives    Start     Dose/Rate Route Frequency Ordered Stop   03/21/15 1000  ciprofloxacin (CIPRO) IVPB 400 mg  Status:  Discontinued     400 mg 200 mL/hr over 60 Minutes Intravenous Every 12 hours 03/21/15 0245 03/21/15 0714   03/21/15 0730  cefTRIAXone (ROCEPHIN) 2 g in dextrose 5 % 50 mL  IVPB    Comments:  Pharmacy may adjust dosing strength / duration / interval for maximal efficacy   2 g 100 mL/hr over 30 Minutes Intravenous Every 24 hours 03/21/15 0715     03/21/15 0600  metroNIDAZOLE (FLAGYL) IVPB 500 mg     500 mg 100 mL/hr over 60 Minutes Intravenous Every 8 hours 03/21/15 0245     03/20/15 2030  ciprofloxacin (CIPRO) IVPB 400 mg     400 mg 200 mL/hr over 60 Minutes Intravenous  Once 03/20/15 2022 03/20/15 2219   03/20/15 2030  metroNIDAZOLE (FLAGYL) IVPB 500 mg     500 mg 100 mL/hr over 60 Minutes Intravenous  Once 03/20/15 2022 03/22/15 0809        Assessment/Plan Acute sigmoid diverticulitis with abscess-improving, allow for clears.  Abscess not amendable to drainage.  Will continue with conservative management.  Monitor white count and temp curve.  Anticipate she will resolve with non operative management.  ID-rocephin D#1 flagyl D#2. CBC in AM.  FEN-clears, IVF, pain control, add PO VTE prophylaxis -SCD/heparin  Emina Riebock, ANP-BC [facility] Pager [phone](7A-4:30P) For consults and floor pages call [phone](7A-4:30P)  03/22/2015 8:29 AM    

## 2015-03-23 LAB — BASIC METABOLIC PANEL
Anion gap: 11 (ref 5–15)
BUN: 5 mg/dL — ABNORMAL LOW (ref 6–20)
CO2: 26 mmol/L (ref 22–32)
Calcium: 8.1 mg/dL — ABNORMAL LOW (ref 8.9–10.3)
Chloride: 100 mmol/L — ABNORMAL LOW (ref 101–111)
Creatinine, Ser: 0.74 mg/dL (ref 0.44–1.00)
GFR calc Af Amer: >60 mL/min (ref >60)
GFR calc non Af Amer: >60 mL/min (ref >60)
Glucose, Bld: 91 mg/dL (ref 65–99)
Potassium: 3.4 mmol/L — ABNORMAL LOW (ref 3.5–5.1)
Sodium: 137 mmol/L (ref 135–145)

## 2015-03-23 LAB — CBC
HCT: 34 % — ABNORMAL LOW (ref 36.0–46.0)
Hemoglobin: 11.2 g/dL — ABNORMAL LOW (ref 12.0–15.0)
MCH: 29 pg (ref 26.0–34.0)
MCHC: 32.9 g/dL (ref 30.0–36.0)
MCV: 88.1 fL (ref 78.0–100.0)
Platelets: 280 10*3/uL (ref 150–400)
RBC: 3.86 MIL/uL — ABNORMAL LOW (ref 3.87–5.11)
RDW: 12.9 % (ref 11.5–15.5)
WBC: 13.2 10*3/uL — ABNORMAL HIGH (ref 4.0–10.5)

## 2015-03-23 MED ORDER — POTASSIUM CHLORIDE CRYS ER 20 MEQ PO TBCR
40.0000 meq | EXTENDED_RELEASE_TABLET | Freq: Once | ORAL | Status: AC
  Administered 2015-03-23: 40 meq via ORAL
  Filled 2015-03-23: qty 2

## 2015-03-23 NOTE — Progress Notes (Signed)
[facility]                                                                              Patient Demographics  Veronica [name], is a 64 y.o. female, DOB - [birth year], [place] date - 03/20/2015   Admitting Physician Waldemar Dickens, MD  Outpatient Primary MD for the patient is Cathlean Cower, MD  LOS - 3   Chief Complaint  Patient presents with  . Abdominal Pain      HPI on 03/20/2015 by Dr. Linna Darner  Veronica [name] is a 64 y.o. female  Abdominal pain. Started 1 week ago. Getting worse. Comes and goes. Left lower quadrant initially but now more generalized. Associated with watery diarrhea. Denies bloody bowel movements, vomiting, fevers, dysuria, frequency. Pain is worse with meals. Some improvement after trying Dulcolax couple of days ago.  Assessment & Plan   Acute sigmoid diverticulitis with abscess:  -CT showing diverticulitis of the lower sigmoid and rectum with 4x3 cm fluid collection in the midline pelvic region suspicious for abscess.  -General surgery consulted and appreciated; Recommend IV antibiotics and possible IR drainage -Abscess not amendable to drainage  -Continue Cipro/Flagyl -Continue conservative management, antibiotics, pain control, IV fluids -Patient tolerating clears, advanced to full liquids today  Hypertension -Patient has reasonable blood pressure controlled -Continue avapro, HCTZ -Hydralazine PRN  Hyponatremia -Sodium was 129, repeated 137 -Continue to monitor BMP and IV fluids  Hypokalemia -Likely secondary to diuretics -Continue to monitor BMP and replace as needed  Code Status: Full  Family Communication: None at bedside  Disposition Plan: Admitted.  Suspect discharge within 24 hours  Time Spent in minutes   30 minutes  Procedures  None  Consults   General surgery Interventional radiology  DVT Prophylaxis  heparin  Lab Results  Component Value Date   PLT 280 03/23/2015     Medications  Scheduled Meds: . aspirin EC  81 mg Oral Daily  . cefTRIAXone (ROCEPHIN)  IV  2 g Intravenous Q24H  . heparin  5,000 Units Subcutaneous 3 times per day  . hydrochlorothiazide  12.5 mg Oral Daily  .  HYDROmorphone (DILAUDID) injection  1 mg Intravenous Once  . irbesartan  150 mg Oral Daily  . metronidazole  500 mg Intravenous Q8H  . sodium chloride  3 mL Intravenous Q12H   Continuous Infusions: . sodium chloride 50 mL/hr at 03/23/15 0839   PRN Meds:.acetaminophen, hydrALAZINE, HYDROcodone-acetaminophen, morphine injection, ondansetron (ZOFRAN) IV  Antibiotics    Anti-infectives    Start     Dose/Rate Route Frequency Ordered Stop   03/21/15 1000  ciprofloxacin (CIPRO) IVPB 400 mg  Status:  Discontinued     400 mg 200 mL/hr over 60 Minutes Intravenous Every 12 hours 03/21/15 0245 03/21/15 0714   03/21/15 0730  cefTRIAXone (ROCEPHIN) 2 g in dextrose 5 % 50 mL IVPB    Comments:  Pharmacy may adjust dosing strength / duration / interval for maximal efficacy   2 g 100 mL/hr over 30 Minutes Intravenous Every 24 hours 03/21/15 0715     03/21/15 0600  metroNIDAZOLE (FLAGYL) IVPB 500 mg     500 mg 100 mL/hr over 60 Minutes Intravenous  Every 8 hours 03/21/15 0245     03/20/15 2030  ciprofloxacin (CIPRO) IVPB 400 mg     400 mg 200 mL/hr over 60 Minutes Intravenous  Once 03/20/15 2022 03/20/15 2219   03/20/15 2030  metroNIDAZOLE (FLAGYL) IVPB 500 mg     500 mg 100 mL/hr over 60 Minutes Intravenous  Once 03/20/15 2022 03/22/15 0809      Subjective:   Malayiah Rajan seen and examined today.  Patient states she had a few episodes of diarrhea overnight with some pain.  Denies current abdominal pain, nausea, vomiting.  Denies chest pain, shortness of breath, dizziness or headache.  Objective:   Filed Vitals:   03/22/15 0500 03/22/15 1358 03/22/15 2200 03/23/15 0600  BP: 122/65 113/64 131/57 119/48  Pulse: 92 87 95 79  Temp: 99.1 F (37.3 C) 98.1 F (36.7 C) 98.7  F (37.1 C) 98.2 F (36.8 C)  TempSrc: Oral Oral Oral Oral  Resp: 18 18 18 18   Height:      Weight:      SpO2: 91% 97% 91% 93%    Wt Readings from Last 3 Encounters:  03/20/15 122.335 kg (269 lb 11.2 oz)  03/20/15 121.11 kg (267 lb)  03/17/15 121.564 kg (268 lb)     Intake/Output Summary (Last 24 hours) at 03/23/15 0938 Last data filed at 03/23/15 0825  Gross per 24 hour  Intake 3766.67 ml  Output    150 ml  Net 3616.67 ml    Exam  General: Well developed, well nourished, NAD, appears stated age  HEENT: NCAT,  mucous membranes moist.   Cardiovascular: S1 S2 auscultated, RRR, no murmurs  Respiratory: Clear to auscultation   Abdomen: Soft, mild tenderness of RLQ, nondistended, +BS.  Extremities: warm dry without cyanosis clubbing or edema  Neuro: AAOx3, nonfocal  Psych: Appropriate mood and affect   Data Review   Micro Results Recent Results (from the past 240 hour(s))  Urine culture     Status: None   Collection Time: 03/17/15  4:02 PM  Result Value Ref Range Status   Colony Count 30,000 COLONIES/ML  Final   Organism ID, Bacteria GROUP B STREP (S.AGALACTIAE) ISOLATED  Final    Comment: Testing against S. agalactiae not routinely performed due to predictability of AMP/PEN/VAN susceptibility.     Radiology Reports Ct Abdomen Pelvis W Contrast  03/20/2015   ADDENDUM REPORT: 03/20/2015 16:15 ADDENDUM: These results were called by telephone at the time of interpretation on 03/20/2015 at 4:14 pm to Dr. Thersa Salt , who verbally acknowledged these results. Electronically Signed   By: Jerilynn Mages.  Shick M.D.   On: 03/20/2015 16:15  03/20/2015   CLINICAL DATA:  Acute lower abdominal and pelvic pain, dysuria, rectal pain, elevated white count.  EXAM: CT ABDOMEN AND PELVIS WITH CONTRAST  TECHNIQUE: Multidetector CT imaging of the abdomen and pelvis was performed using the standard protocol following bolus administration of intravenous contrast.  CONTRAST:  160mL OMNIPAQUE  IOHEXOL 300 MG/ML  SOLN  COMPARISON:  None.  FINDINGS: Lower chest: Healed right lower rib fractures with deformity. Lung bases remain clear. Normal heart size. No pericardial or pleural effusion. Degenerative changes of the lower thoracic spine. Small hiatal hernia evident.  Abdomen: Prior cholecystectomy. Small midline fat containing umbilical hernia with an abdominal defect measuring 4.5 cm, image 43. This is just above the umbilicus.  Liver, biliary system, pancreas, spleen, adrenal glands, and kidneys are within normal limits for age and demonstrate no acute process.  Negative for bowel obstruction,  dilatation, ileus, or significant free air.  Retro aortic left renal vein noted, normal variant.  Intact aorta. Negative for aneurysm. No acute retroperitoneal process.  Pelvis: Diverticulosis present of the colon. Lower sigmoid and rectum demonstrates wall thickening with surrounding edema and inflammation compatible with diverticulitis. Difficult to exclude a mural lesion. Deep within the pelvis, 2 small fluid collections are present 1 the midline measures 39 x 31 mm posterior to the uterus image 65. Left adnexal small fluid collection versus ovarian cyst measures 30 x 21 mm, image 63. Urinary bladder collapsed. Uterus is tilted to the right.  No adenopathy , inguinal abnormality, or inguinal hernia.  Degenerative changes of the lower lumbar spine and SI joints.  IMPRESSION: Acute diverticulitis of the lower sigmoid colon and rectum.  Midline pelvic small fluid collection measuring 4 x 3 cm suspicious for associated abscess  Left adnexal small fluid collection versus cystic area along the left ovary could represent a second abscess versus a small ovarian cyst measuring 30 x 21 mm.  No associated obstruction or ileus  Small fat containing ventral hernia superior to the umbilicus.  Prior cholecystectomy  Small hiatal hernia  Electronically Signed: By: Jerilynn Mages.  Shick M.D. On: 03/20/2015 15:55    CBC  Recent Labs Lab  03/20/15 1315 03/20/15 1732 03/21/15 0515 03/22/15 0520 03/23/15 0511  WBC 15.2* 14.9* 13.1* 13.5* 13.2*  HGB 13.1 12.7 12.2 11.2* 11.2*  HCT 39.7 38.0 36.9 33.2* 34.0*  PLT 262 277 274 265 280  MCV 87.8 87.2 88.3 88.1 88.1  MCH 29.0 29.1 29.2 29.7 29.0  MCHC 33.0 33.4 33.1 33.7 32.9  RDW 12.7 12.7 13.0 13.0 12.9    Chemistries   Recent Labs Lab 03/20/15 1315 03/20/15 1732 03/21/15 0515 03/23/15 0511  NA 136 129* 135 137  K 2.9* 2.9* 3.7 3.4*  CL 96* 91* 101 100*  CO2 28 27 28 26   GLUCOSE 94 112* 91 91  BUN 7 8 7  5*  CREATININE 0.83 0.93 0.88 0.74  CALCIUM 8.9 8.6* 8.2* 8.1*  AST 25 23  --   --   ALT 22 21  --   --   ALKPHOS 81 82  --   --   BILITOT 1.6* 1.5*  --   --    ------------------------------------------------------------------------------------------------------------------ estimated creatinine clearance is 93.5 mL/min (by C-G formula based on Cr of 0.74). ------------------------------------------------------------------------------------------------------------------ No results for input(s): HGBA1C in the last 72 hours. ------------------------------------------------------------------------------------------------------------------ No results for input(s): CHOL, HDL, LDLCALC, TRIG, CHOLHDL, LDLDIRECT in the last 72 hours. ------------------------------------------------------------------------------------------------------------------ No results for input(s): TSH, T4TOTAL, T3FREE, THYROIDAB in the last 72 hours.  Invalid input(s): FREET3 ------------------------------------------------------------------------------------------------------------------ No results for input(s): VITAMINB12, FOLATE, FERRITIN, TIBC, IRON, RETICCTPCT in the last 72 hours.  Coagulation profile  Recent Labs Lab 03/21/15 0515  INR 1.32    No results for input(s): DDIMER in the last 72 hours.  Cardiac Enzymes No results for input(s): CKMB, TROPONINI, MYOGLOBIN in the  last 168 hours.  Invalid input(s): CK ------------------------------------------------------------------------------------------------------------------ Invalid input(s): POCBNP    Ilee Randleman D.O. on 03/23/2015 at 9:38 AM  Between 7am to 7pm - Pager - [phone]  After 7pm go to [email or web address] - password [email or web address]  And look for the night coverage person covering for me after hours  [facility] Office  [phone]

## 2015-03-23 NOTE — Progress Notes (Signed)
Patient ID: Veronica [name], female   DOB: Sep 01, 1950, 64 y.o.   MRN: [id number]     [facility]      [address] [place], [place] [address]    Phone: [phone] FAX: [phone]     Subjective: Now having diarrhea.  Pain is better, mild.  Afebrile.  WBC 13.1-->13.5--->13.2  Objective:  Vital signs:  Filed Vitals:   03/22/15 0500 03/22/15 1358 03/22/15 2200 03/23/15 0600  BP: 122/65 113/64 131/57 119/48  Pulse: 92 87 95 79  Temp: 99.1 F (37.3 C) 98.1 F (36.7 C) 98.7 F (37.1 C) 98.2 F (36.8 C)  TempSrc: Oral Oral Oral Oral  Resp: _0 Height:      Weight:      SpO2: 91% 97% 91% 93%    Last BM Date: 03/22/15  Intake/Output   Yesterday:  10/05 0701 - 10/06 0700 In: 3646.7 [P.O.:480; I.V.:3016.7; IV Piggyback:150] Out: 150 [Urine:150] This shift:    I/O last 3 completed shifts: In: 3646.7 [P.O.:480; I.V.:3016.7; IV Piggyback:150] Out: 150 [Urine:150]     Physical Exam: General: Pt awake/alert/oriented x4 in no acute distress Abdomen: Soft. Nondistended. Mild tenderness RLQ and suprapubic region. No evidence of peritonitis. No incarcerated hernias.    Problem List:   Principal Problem:   Diverticulitis Active Problems:   Abdominal abscess ([place])   Hyponatremia   Essential hypertension   Hypokalemia    Results:   Labs: Results for orders placed or performed during the hospital encounter of 03/20/15 (from the past 48 hour(s))  Urinalysis, Routine w reflex microscopic (not at [facility])     Status: None   Collection Time: 03/21/15  9:05 AM  Result Value Ref Range   Color, Urine YELLOW YELLOW   APPearance CLEAR CLEAR   Specific Gravity, Urine 1.015 1.005 - 1.030   pH 6.0 5.0 - 8.0   Glucose, UA NEGATIVE NEGATIVE mg/dL   Hgb urine dipstick NEGATIVE NEGATIVE   Bilirubin Urine NEGATIVE NEGATIVE   Ketones, ur NEGATIVE NEGATIVE mg/dL   Protein, ur NEGATIVE NEGATIVE mg/dL   Urobilinogen, UA  1.0 0.0 - 1.0 mg/dL   Nitrite NEGATIVE NEGATIVE   Leukocytes, UA NEGATIVE NEGATIVE    Comment: MICROSCOPIC NOT DONE ON URINES WITH NEGATIVE PROTEIN, BLOOD, LEUKOCYTES, NITRITE, OR GLUCOSE <1000 mg/dL.  CBC     Status: Abnormal   Collection Time: 03/22/15  5:20 AM  Result Value Ref Range   WBC 13.5 (H) 4.0 - 10.5 K/uL    Comment: WHITE COUNT CONFIRMED ON SMEAR   RBC 3.77 (L) 3.87 - 5.11 MIL/uL   Hemoglobin 11.2 (L) 12.0 - 15.0 g/dL   HCT 33.2 (L) 36.0 - 46.0 %   MCV 88.1 78.0 - 100.0 fL   MCH 29.7 26.0 - 34.0 pg   MCHC 33.7 30.0 - 36.0 g/dL   RDW 13.0 11.5 - 15.5 %   Platelets 265 150 - 400 K/uL    Comment: PLATELET COUNT CONFIRMED BY SMEAR  CBC     Status: Abnormal   Collection Time: 03/23/15  5:11 AM  Result Value Ref Range   WBC 13.2 (H) 4.0 - 10.5 K/uL   RBC 3.86 (L) 3.87 - 5.11 MIL/uL   Hemoglobin 11.2 (L) 12.0 - 15.0 g/dL   HCT 34.0 (L) 36.0 - 46.0 %   MCV 88.1 78.0 - 100.0 fL   MCH 29.0 26.0 - 34.0 pg   MCHC 32.9 30.0 - 36.0 g/dL   RDW  12.9 11.5 - 15.5 %   Platelets 280 150 - 400 K/uL  Basic metabolic panel     Status: Abnormal   Collection Time: 03/23/15  5:11 AM  Result Value Ref Range   Sodium 137 135 - 145 mmol/L   Potassium 3.4 (L) 3.5 - 5.1 mmol/L   Chloride 100 (L) 101 - 111 mmol/L   CO2 26 22 - 32 mmol/L   Glucose, Bld 91 65 - 99 mg/dL   BUN 5 (L) 6 - 20 mg/dL   Creatinine, Ser 0.74 0.44 - 1.00 mg/dL   Calcium 8.1 (L) 8.9 - 10.3 mg/dL   GFR calc non Af Amer >60 >60 mL/min   GFR calc Af Amer >60 >60 mL/min    Comment: (NOTE) The eGFR has been calculated using the CKD EPI equation. This calculation has not been validated in all clinical situations. eGFR's persistently <60 mL/min signify possible Chronic Kidney Disease.    Anion gap 11 5 - 15    Imaging / Studies: No results found.  Medications / Allergies:  Scheduled Meds: . aspirin EC  81 mg Oral Daily  . cefTRIAXone (ROCEPHIN)  IV  2 g Intravenous Q24H  . heparin  5,000 Units Subcutaneous 3  times per day  . hydrochlorothiazide  12.5 mg Oral Daily  .  HYDROmorphone (DILAUDID) injection  1 mg Intravenous Once  . irbesartan  150 mg Oral Daily  . metronidazole  500 mg Intravenous Q8H  . potassium chloride  40 mEq Oral Once  . sodium chloride  3 mL Intravenous Q12H   Continuous Infusions: . sodium chloride 125 mL/hr at 03/23/15 0620   PRN Meds:.acetaminophen, hydrALAZINE, HYDROcodone-acetaminophen, morphine injection, ondansetron (ZOFRAN) IV  Antibiotics: Anti-infectives    Start     Dose/Rate Route Frequency Ordered Stop   03/21/15 1000  ciprofloxacin (CIPRO) IVPB 400 mg  Status:  Discontinued     400 mg 200 mL/hr over 60 Minutes Intravenous Every 12 hours 03/21/15 0245 03/21/15 0714   03/21/15 0730  cefTRIAXone (ROCEPHIN) 2 g in dextrose 5 % 50 mL IVPB    Comments:  Pharmacy may adjust dosing strength / duration / interval for maximal efficacy   2 g 100 mL/hr over 30 Minutes Intravenous Every 24 hours 03/21/15 0715     03/21/15 0600  metroNIDAZOLE (FLAGYL) IVPB 500 mg     500 mg 100 mL/hr over 60 Minutes Intravenous Every 8 hours 03/21/15 0245     03/20/15 2030  ciprofloxacin (CIPRO) IVPB 400 mg     400 mg 200 mL/hr over 60 Minutes Intravenous  Once 03/20/15 2022 03/20/15 2219   03/20/15 2030  metroNIDAZOLE (FLAGYL) IVPB 500 mg     500 mg 100 mL/hr over 60 Minutes Intravenous  Once 03/20/15 2022 03/22/15 0809       Assessment/Plan Acute sigmoid diverticulitis with abscess-improving, allow for fulls. Abscess not amendable to drainage. Will continue with conservative management. Monitor white count and temp curve. May need repeat CT scan if leukocytosis persists(last CT 10/3) ID-rocephin D#2 flagyl D#3. CBC in AM.  FEN-fulls, reduce IVF, PO pain meds VTE prophylaxis -SCD/heparin  Erby Pian, ANP-BC [facility] Pager [phone](7A-4:30P) For consults and floor pages call [phone](7A-4:30P)  03/23/2015 8:01 AM

## 2015-03-24 LAB — BASIC METABOLIC PANEL
Anion gap: 11 (ref 5–15)
BUN: <5 mg/dL — ABNORMAL LOW (ref 6–20)
CO2: 30 mmol/L (ref 22–32)
Calcium: 8.4 mg/dL — ABNORMAL LOW (ref 8.9–10.3)
Chloride: 99 mmol/L — ABNORMAL LOW (ref 101–111)
Creatinine, Ser: 0.82 mg/dL (ref 0.44–1.00)
GFR calc Af Amer: >60 mL/min (ref >60)
GFR calc non Af Amer: >60 mL/min (ref >60)
Glucose, Bld: 91 mg/dL (ref 65–99)
Potassium: 4.1 mmol/L (ref 3.5–5.1)
Sodium: 140 mmol/L (ref 135–145)

## 2015-03-24 LAB — CBC
HCT: 34 % — ABNORMAL LOW (ref 36.0–46.0)
Hemoglobin: 11.2 g/dL — ABNORMAL LOW (ref 12.0–15.0)
MCH: 29.2 pg (ref 26.0–34.0)
MCHC: 32.9 g/dL (ref 30.0–36.0)
MCV: 88.8 fL (ref 78.0–100.0)
Platelets: 278 10*3/uL (ref 150–400)
RBC: 3.83 MIL/uL — ABNORMAL LOW (ref 3.87–5.11)
RDW: 13 % (ref 11.5–15.5)
WBC: 11 10*3/uL — ABNORMAL HIGH (ref 4.0–10.5)

## 2015-03-24 MED ORDER — SODIUM CHLORIDE 0.9 % IJ SOLN
3.0000 mL | Freq: Two times a day (BID) | INTRAMUSCULAR | Status: DC
  Administered 2015-03-24 – 2015-03-29 (×10): 3 mL via INTRAVENOUS

## 2015-03-24 MED ORDER — METRONIDAZOLE 500 MG PO TABS
500.0000 mg | ORAL_TABLET | Freq: Three times a day (TID) | ORAL | Status: DC
  Administered 2015-03-24 – 2015-03-30 (×18): 500 mg via ORAL
  Filled 2015-03-24 (×18): qty 1

## 2015-03-24 MED ORDER — SODIUM CHLORIDE 0.9 % IJ SOLN: 3.0000 mL | INTRAMUSCULAR | Status: DC | PRN

## 2015-03-24 MED ORDER — CIPROFLOXACIN HCL 500 MG PO TABS
500.0000 mg | ORAL_TABLET | Freq: Two times a day (BID) | ORAL | Status: DC
  Administered 2015-03-24 – 2015-03-30 (×13): 500 mg via ORAL
  Filled 2015-03-24 (×13): qty 1

## 2015-03-24 MED ORDER — ALUM & MAG HYDROXIDE-SIMETH 200-200-20 MG/5ML PO SUSP
30.0000 mL | Freq: Four times a day (QID) | ORAL | Status: DC | PRN
Start: 2015-03-24 — End: 2015-03-30
  Administered 2015-03-24 – 2015-03-25 (×2): 30 mL via ORAL
  Filled 2015-03-24 (×2): qty 30

## 2015-03-24 NOTE — Progress Notes (Signed)
Patient ID: Veronica [name], female   DOB: 12/24/1950, 63 y.o.   MRN: [id number]     [facility]      [address]   [place], [place] [address]    Phone: [phone] FAX: [phone]     Subjective: Loose stools.  Still some pain, but tolerating a soft diet, but taking pain meds.  No n/v.  No fevers. Ambulating in hallways.    Objective:  Vital signs:  Filed Vitals:   03/23/15 0600 03/23/15 1333 03/23/15 2157 03/24/15 0530  BP: 119/48 126/72 139/64 128/59  Pulse: 79 85 75 66  Temp: 98.2 F (36.8 C) 98 F (36.7 C) 98.2 F (36.8 C) 98.2 F (36.8 C)  TempSrc: Oral Oral Oral Oral  Resp: 18 18 20 18  Height:      Weight:      SpO2: 93% 99% 98% 100%    Last BM Date: 03/23/15  Intake/Output   Yesterday:  10/06 0701 - 10/07 0700 In: 2544.8 [P.O.:864; I.V.:1330.8; IV Piggyback:350] Out: -  This shift: I/O last 3 completed shifts: In: 4561.5 [P.O.:864; I.V.:3347.5; IV Piggyback:350] Out: -     Physical Exam: General: Pt awake/alert/oriented x4 in no acute distress Abdomen: Soft. Nondistended. Mild tenderness RLQ and suprapubic region. No evidence of peritonitis. No incarcerated hernias.  Problem List:   Principal Problem:   Diverticulitis Active Problems:   Abdominal abscess ([facility])   Hyponatremia   Essential hypertension   Hypokalemia    Results:   Labs: Results for orders placed or performed during the hospital encounter of 03/20/15 (from the past 48 hour(s))  CBC     Status: Abnormal   Collection Time: 03/23/15  5:11 AM  Result Value Ref Range   WBC 13.2 (H) 4.0 - 10.5 K/uL   RBC 3.86 (L) 3.87 - 5.11 MIL/uL   Hemoglobin 11.2 (L) 12.0 - 15.0 g/dL   HCT 34.0 (L) 36.0 - 46.0 %   MCV 88.1 78.0 - 100.0 fL   MCH 29.0 26.0 - 34.0 pg   MCHC 32.9 30.0 - 36.0 g/dL   RDW 12.9 11.5 - 15.5 %   Platelets 280 150 - 400 K/uL  Basic metabolic panel     Status: Abnormal   Collection Time: 03/23/15  5:11 AM   Result Value Ref Range   Sodium 137 135 - 145 mmol/L   Potassium 3.4 (L) 3.5 - 5.1 mmol/L   Chloride 100 (L) 101 - 111 mmol/L   CO2 26 22 - 32 mmol/L   Glucose, Bld 91 65 - 99 mg/dL   BUN 5 (L) 6 - 20 mg/dL   Creatinine, Ser 0.74 0.44 - 1.00 mg/dL   Calcium 8.1 (L) 8.9 - 10.3 mg/dL   GFR calc non Af Amer >60 >60 mL/min   GFR calc Af Amer >60 >60 mL/min    Comment: (NOTE) The eGFR has been calculated using the CKD EPI equation. This calculation has not been validated in all clinical situations. eGFR's persistently <60 mL/min signify possible Chronic Kidney Disease.    Anion gap 11 5 - 15  CBC     Status: Abnormal   Collection Time: 03/24/15  4:25 AM  Result Value Ref Range   WBC 11.0 (H) 4.0 - 10.5 K/uL   RBC 3.83 (L) 3.87 - 5.11 MIL/uL   Hemoglobin 11.2 (L) 12.0 - 15.0 g/dL   HCT 34.0 (L) 36.0 - 46.0 %   MCV 88.8 78.0 - 100.0 fL   MCH   29.2 26.0 - 34.0 pg   MCHC 32.9 30.0 - 36.0 g/dL   RDW 13.0 11.5 - 15.5 %   Platelets 278 150 - 400 K/uL  Basic metabolic panel     Status: Abnormal   Collection Time: 03/24/15  4:25 AM  Result Value Ref Range   Sodium 140 135 - 145 mmol/L   Potassium 4.1 3.5 - 5.1 mmol/L    Comment: DELTA CHECK NOTED   Chloride 99 (L) 101 - 111 mmol/L   CO2 30 22 - 32 mmol/L   Glucose, Bld 91 65 - 99 mg/dL   BUN <5 (L) 6 - 20 mg/dL   Creatinine, Ser 0.82 0.44 - 1.00 mg/dL   Calcium 8.4 (L) 8.9 - 10.3 mg/dL   GFR calc non Af Amer >60 >60 mL/min   GFR calc Af Amer >60 >60 mL/min    Comment: (NOTE) The eGFR has been calculated using the CKD EPI equation. This calculation has not been validated in all clinical situations. eGFR's persistently <60 mL/min signify possible Chronic Kidney Disease.    Anion gap 11 5 - 15    Imaging / Studies: No results found.  Medications / Allergies:  Scheduled Meds: . aspirin EC  81 mg Oral Daily  . ciprofloxacin  500 mg Oral BID  . heparin  5,000 Units Subcutaneous 3 times per day  . hydrochlorothiazide  12.5 mg  Oral Daily  .  HYDROmorphone (DILAUDID) injection  1 mg Intravenous Once  . irbesartan  150 mg Oral Daily  . metroNIDAZOLE  500 mg Oral 3 times per day  . sodium chloride  3 mL Intravenous Q12H   Continuous Infusions: . sodium chloride 50 mL/hr at 03/24/15 0448   PRN Meds:.acetaminophen, hydrALAZINE, HYDROcodone-acetaminophen, morphine injection, ondansetron (ZOFRAN) IV  Antibiotics: Anti-infectives    Start     Dose/Rate Route Frequency Ordered Stop   03/24/15 0900  ciprofloxacin (CIPRO) tablet 500 mg     500 mg Oral 2 times daily 03/24/15 0856     03/24/15 0900  metroNIDAZOLE (FLAGYL) tablet 500 mg     500 mg Oral 3 times per day 03/24/15 0856     03/21/15 1000  ciprofloxacin (CIPRO) IVPB 400 mg  Status:  Discontinued     400 mg 200 mL/hr over 60 Minutes Intravenous Every 12 hours 03/21/15 0245 03/21/15 0714   03/21/15 0730  cefTRIAXone (ROCEPHIN) 2 g in dextrose 5 % 50 mL IVPB  Status:  Discontinued    Comments:  Pharmacy may adjust dosing strength / duration / interval for maximal efficacy   2 g 100 mL/hr over 30 Minutes Intravenous Every 24 hours 03/21/15 0715 03/24/15 0856   03/21/15 0600  metroNIDAZOLE (FLAGYL) IVPB 500 mg  Status:  Discontinued     500 mg 100 mL/hr over 60 Minutes Intravenous Every 8 hours 03/21/15 0245 03/24/15 0856   03/20/15 2030  ciprofloxacin (CIPRO) IVPB 400 mg     400 mg 200 mL/hr over 60 Minutes Intravenous  Once 03/20/15 2022 03/20/15 2219   03/20/15 2030  metroNIDAZOLE (FLAGYL) IVPB 500 mg     500 mg 100 mL/hr over 60 Minutes Intravenous  Once 03/20/15 2022 03/22/15 0809         Assessment/Plan HD#4 Acute sigmoid diverticulitis with abscess-improving, continue with a low fiber  Diet.  Consult to dietician.  Abscess not amendable to drainage. Will continue with conservative management. Monitor white count and temp curve. Repeat CT if pain worsens or WBC increases(last CT 10/3) ID-rocephin D#3  flagyl D#4. change to PO cipro/flagyl for  total of 14 days of atbx.  CBC in AM.  FEN-soft diet, DC IVF VTE prophylaxis -SCD/heparin Dispo-home soon if WBC continues to trend down and pain continues to improve   Emina Riebock, ANP-BC [facility] Pager [phone](7A-4:30P) For consults and floor pages call [phone](7A-4:30P)  03/24/2015 8:58 AM    

## 2015-03-24 NOTE — Progress Notes (Signed)
[facility]                                                                              Patient Demographics  Veronica [name], is a 64 y.o. female, DOB - 1951-04-24, [place] date - 03/20/2015   Admitting Physician Waldemar Dickens, MD  Outpatient Primary MD for the patient is Cathlean Cower, MD  LOS - 4   Chief Complaint  Patient presents with  . Abdominal Pain      HPI on 03/20/2015 by Dr. Linna Darner  Veronica [name] is a 64 y.o. female  Abdominal pain. Started 1 week ago. Getting worse. Comes and goes. Left lower quadrant initially but now more generalized. Associated with watery diarrhea. Denies bloody bowel movements, vomiting, fevers, dysuria, frequency. Pain is worse with meals. Some improvement after trying Dulcolax couple of days ago.  Assessment & Plan   Acute sigmoid diverticulitis with abscess:  -CT showing diverticulitis of the lower sigmoid and rectum with 4x3 cm fluid collection in the midline pelvic region suspicious for abscess.  -General surgery consulted and appreciated; Recommend IV antibiotics and possible IR drainage -Abscess not amendable to drainage  -Continue Cipro/Flagyl- transitioned to orals today -Continue conservative management, antibiotics, pain contro -Patient tolerating soft diet  Hypertension -Patient has reasonable blood pressure controlled -Continue avapro, HCTZ  Hyponatremia -Resolved, Sodium was 129 upon admission, currently 140 -Continue to monitor BMP and IV fluids  Hypokalemia -Resolved, Likely secondary to diuretics -Continue to monitor BMP and replace as needed  Code Status: Full  Family Communication: None at bedside  Disposition Plan: Admitted.  Suspect discharge within 24 hours  Time Spent in minutes   30 minutes  Procedures  None  Consults   General surgery Interventional radiology  DVT Prophylaxis  heparin  Lab Results  Component Value Date   PLT 278 03/24/2015     Medications  Scheduled Meds: . aspirin EC  81 mg Oral Daily  . ciprofloxacin  500 mg Oral BID  . heparin  5,000 Units Subcutaneous 3 times per day  . hydrochlorothiazide  12.5 mg Oral Daily  .  HYDROmorphone (DILAUDID) injection  1 mg Intravenous Once  . irbesartan  150 mg Oral Daily  . metroNIDAZOLE  500 mg Oral Q8H  . sodium chloride  3 mL Intravenous Q12H  . sodium chloride  3 mL Intravenous Q12H   Continuous Infusions:   PRN Meds:.acetaminophen, hydrALAZINE, HYDROcodone-acetaminophen, morphine injection, ondansetron (ZOFRAN) IV, sodium chloride  Antibiotics    Anti-infectives    Start     Dose/Rate Route Frequency Ordered Stop   03/24/15 0930  ciprofloxacin (CIPRO) tablet 500 mg     500 mg Oral 2 times daily 03/24/15 0856     03/24/15 0930  metroNIDAZOLE (FLAGYL) tablet 500 mg     500 mg Oral Every 8 hours 03/24/15 0856     03/21/15 1000  ciprofloxacin (CIPRO) IVPB 400 mg  Status:  Discontinued     400 mg 200 mL/hr over 60 Minutes Intravenous Every 12 hours 03/21/15 0245 03/21/15 0714   03/21/15 0730  cefTRIAXone (ROCEPHIN) 2 g in dextrose 5 % 50 mL IVPB  Status:  Discontinued    Comments:  Pharmacy may adjust dosing strength / duration / interval for maximal efficacy   2 g 100 mL/hr over 30 Minutes Intravenous Every 24 hours 03/21/15 0715 03/24/15 0856   03/21/15 0600  metroNIDAZOLE (FLAGYL) IVPB 500 mg  Status:  Discontinued     500 mg 100 mL/hr over 60 Minutes Intravenous Every 8 hours 03/21/15 0245 03/24/15 0856   03/20/15 2030  ciprofloxacin (CIPRO) IVPB 400 mg     400 mg 200 mL/hr over 60 Minutes Intravenous  Once 03/20/15 2022 03/20/15 2219   03/20/15 2030  metroNIDAZOLE (FLAGYL) IVPB 500 mg     500 mg 100 mL/hr over 60 Minutes Intravenous  Once 03/20/15 2022 03/22/15 0809      Subjective:   Veronica [name] seen and examined today.  Patient continues to have some abdominal pain.  States she has been able to eat some.  Denies nausea/vomiting.  Denies  diarrhea today.  Denies chest pain, shortness of breath, dizziness or headache.  Objective:   Filed Vitals:   03/23/15 1333 03/23/15 2157 03/24/15 0530 03/24/15 0929  BP: 126/72 139/64 128/59 127/76  Pulse: 85 75 66 79  Temp: 98 F (36.7 C) 98.2 F (36.8 C) 98.2 F (36.8 C) 98.5 F (36.9 C)  TempSrc: Oral Oral Oral Oral  Resp: 18 20 18 15   Height:      Weight:      SpO2: 99% 98% 100% 98%    Wt Readings from Last 3 Encounters:  03/20/15 122.335 kg (269 lb 11.2 oz)  03/20/15 121.11 kg (267 lb)  03/17/15 121.564 kg (268 lb)     Intake/Output Summary (Last 24 hours) at 03/24/15 1120 Last data filed at 03/24/15 0930  Gross per 24 hour  Intake 2470.83 ml  Output      0 ml  Net 2470.83 ml    Exam  General: Well developed, well nourished, NAD  HEENT: NCAT,  mucous membranes moist.   Cardiovascular: S1 S2 auscultated, RRR, no murmurs  Respiratory: Clear to auscultation bilaterally   Abdomen: Soft, RLQ and suprapubic TTP (mild), nondistended, +BS.  Extremities: warm dry without cyanosis clubbing or edema  Neuro: AAOx3, nonfocal  Psych: Appropriate mood and affect, pleasant  Data Review   Micro Results Recent Results (from the past 240 hour(s))  Urine culture     Status: None   Collection Time: 03/17/15  4:02 PM  Result Value Ref Range Status   Colony Count 30,000 COLONIES/ML  Final   Organism ID, Bacteria GROUP B STREP (S.AGALACTIAE) ISOLATED  Final    Comment: Testing against S. agalactiae not routinely performed due to predictability of AMP/PEN/VAN susceptibility.     Radiology Reports Ct Abdomen Pelvis W Contrast  03/20/2015   ADDENDUM REPORT: 03/20/2015 16:15 ADDENDUM: These results were called by telephone at the time of interpretation on 03/20/2015 at 4:14 pm to Dr. Thersa Salt , who verbally acknowledged these results. Electronically Signed   By: Jerilynn Mages.  Shick M.D.   On: 03/20/2015 16:15  03/20/2015   CLINICAL DATA:  Acute lower abdominal and pelvic pain,  dysuria, rectal pain, elevated white count.  EXAM: CT ABDOMEN AND PELVIS WITH CONTRAST  TECHNIQUE: Multidetector CT imaging of the abdomen and pelvis was performed using the standard protocol following bolus administration of intravenous contrast.  CONTRAST:  129mL OMNIPAQUE IOHEXOL 300 MG/ML  SOLN  COMPARISON:  None.  FINDINGS: Lower chest: Healed right lower rib fractures with deformity. Lung bases remain clear. Normal heart size. No pericardial or pleural effusion. Degenerative changes of  the lower thoracic spine. Small hiatal hernia evident.  Abdomen: Prior cholecystectomy. Small midline fat containing umbilical hernia with an abdominal defect measuring 4.5 cm, image 43. This is just above the umbilicus.  Liver, biliary system, pancreas, spleen, adrenal glands, and kidneys are within normal limits for age and demonstrate no acute process.  Negative for bowel obstruction, dilatation, ileus, or significant free air.  Retro aortic left renal vein noted, normal variant.  Intact aorta. Negative for aneurysm. No acute retroperitoneal process.  Pelvis: Diverticulosis present of the colon. Lower sigmoid and rectum demonstrates wall thickening with surrounding edema and inflammation compatible with diverticulitis. Difficult to exclude a mural lesion. Deep within the pelvis, 2 small fluid collections are present 1 the midline measures 39 x 31 mm posterior to the uterus image 65. Left adnexal small fluid collection versus ovarian cyst measures 30 x 21 mm, image 63. Urinary bladder collapsed. Uterus is tilted to the right.  No adenopathy , inguinal abnormality, or inguinal hernia.  Degenerative changes of the lower lumbar spine and SI joints.  IMPRESSION: Acute diverticulitis of the lower sigmoid colon and rectum.  Midline pelvic small fluid collection measuring 4 x 3 cm suspicious for associated abscess  Left adnexal small fluid collection versus cystic area along the left ovary could represent a second abscess versus a  small ovarian cyst measuring 30 x 21 mm.  No associated obstruction or ileus  Small fat containing ventral hernia superior to the umbilicus.  Prior cholecystectomy  Small hiatal hernia  Electronically Signed: By: Jerilynn Mages.  Shick M.D. On: 03/20/2015 15:55    CBC  Recent Labs Lab 03/20/15 1732 03/21/15 0515 03/22/15 0520 03/23/15 0511 03/24/15 0425  WBC 14.9* 13.1* 13.5* 13.2* 11.0*  HGB 12.7 12.2 11.2* 11.2* 11.2*  HCT 38.0 36.9 33.2* 34.0* 34.0*  PLT 277 274 265 280 278  MCV 87.2 88.3 88.1 88.1 88.8  MCH 29.1 29.2 29.7 29.0 29.2  MCHC 33.4 33.1 33.7 32.9 32.9  RDW 12.7 13.0 13.0 12.9 13.0    Chemistries   Recent Labs Lab 03/20/15 1315 03/20/15 1732 03/21/15 0515 03/23/15 0511 03/24/15 0425  NA 136 129* 135 137 140  K 2.9* 2.9* 3.7 3.4* 4.1  CL 96* 91* 101 100* 99*  CO2 28 27 28 26 30   GLUCOSE 94 112* 91 91 91  BUN 7 8 7  5* <5*  CREATININE 0.83 0.93 0.88 0.74 0.82  CALCIUM 8.9 8.6* 8.2* 8.1* 8.4*  AST 25 23  --   --   --   ALT 22 21  --   --   --   ALKPHOS 81 82  --   --   --   BILITOT 1.6* 1.5*  --   --   --    ------------------------------------------------------------------------------------------------------------------ estimated creatinine clearance is 91.2 mL/min (by C-G formula based on Cr of 0.82). ------------------------------------------------------------------------------------------------------------------ No results for input(s): HGBA1C in the last 72 hours. ------------------------------------------------------------------------------------------------------------------ No results for input(s): CHOL, HDL, LDLCALC, TRIG, CHOLHDL, LDLDIRECT in the last 72 hours. ------------------------------------------------------------------------------------------------------------------ No results for input(s): TSH, T4TOTAL, T3FREE, THYROIDAB in the last 72 hours.  Invalid input(s):  FREET3 ------------------------------------------------------------------------------------------------------------------ No results for input(s): VITAMINB12, FOLATE, FERRITIN, TIBC, IRON, RETICCTPCT in the last 72 hours.  Coagulation profile  Recent Labs Lab 03/21/15 0515  INR 1.32    No results for input(s): DDIMER in the last 72 hours.  Cardiac Enzymes No results for input(s): CKMB, TROPONINI, MYOGLOBIN in the last 168 hours.  Invalid input(s): CK ------------------------------------------------------------------------------------------------------------------ Invalid input(s): POCBNP  Nakema Fake D.O. on 03/24/2015 at 11:20 AM  Between 7am to 7pm - Pager - [phone]  After 7pm go to [email or web address] - password [email or web address]  And look for the night coverage person covering for me after hours  [facility] Office  [phone]

## 2015-03-24 NOTE — Care Management Note (Signed)
Case Management Note  Patient Details  Name: Veronica [name] MRN: [id number] Date of Birth: [birth year]  Subjective/Objective:                    Action/Plan:  UR updated  Expected Discharge Date:                  Expected Discharge Plan:  Home/Self Care  In-House Referral:     Discharge planning Services     Post Acute Care Choice:    Choice offered to:     DME Arranged:    DME Agency:     HH Arranged:    [place] Agency:     Status of Service:  In process, will continue to follow  Medicare Important Message Given:    Date Medicare IM Given:    Medicare IM give by:    Date Additional Medicare IM Given:    Additional Medicare Important Message give by:     If discussed at [place] of Stay Meetings, dates discussed:    Additional Comments:  Marilu Favre, RN 03/24/2015, 8:22 AM

## 2015-03-24 NOTE — Progress Notes (Signed)
Patient reports some nausea and decreased appetite.  Patient refuses anti-emetic medication at this time.  Patient states she has not had a BM today and the only other thing that has changed, is that the antibiotics were switched from IV to PO.  Will continue to monitor.  Brita Romp, RN

## 2015-03-24 NOTE — Plan of Care (Signed)
Problem: Food- and Nutrition-Related Knowledge Deficit (NB-1.1) Goal: Nutrition education Formal process to instruct or train a patient/client in a skill or to impart knowledge to help patients/clients voluntarily manage or modify food choices and eating behavior to maintain or improve health. Outcome: Completed/Met Date Met:  03/24/15  RD consulted for nutrition education regarding diverticulosis.    Per pt she had recently been told after her colonoscopy that she had diverticulosis. She normally tries to eat high fiber foods. We discussed importance of avoiding fiber during this acute episode and how to increase the fiber in her diet gradually once she is better.  RD provided "Low Fiber" handout from the [employer]. Discussed different food groups and how to avoid fiber. Provided list of low and high fiber foods.  Discussed importance of avoiding nuts,seeds,hulls, etc. Teach back method used.  Expect good compliance.  Body mass index is 43.55 kg/(m^2). Pt meets criteria for morbid obesity class III based on current BMI.  Current diet order is Soft, patient is consuming approximately 100% of meals at this time. Labs and medications reviewed. No further nutrition interventions warranted at this time. RD contact information provided. If additional nutrition issues arise, please re-consult RD.  [place], [place], [place] Pager [phone] After Hours Pager

## 2015-03-24 NOTE — Progress Notes (Signed)
Patient informed this nurse that she vomited 1/2 cup of white liquid but no longer nauseous after drinking ginger ale.  Cipro tablet was given earlier with apple sauce.  Patient is resting comfortably on bed as of this writing.

## 2015-03-25 ENCOUNTER — Inpatient Hospital Stay (HOSPITAL_COMMUNITY): Payer: BC Managed Care – PPO

## 2015-03-25 LAB — IRON AND TIBC
Iron: 52 ug/dL (ref 28–170)
Saturation Ratios: 21 % (ref 10.4–31.8)
TIBC: 244 ug/dL — ABNORMAL LOW (ref 250–450)
UIBC: 192 ug/dL

## 2015-03-25 LAB — RETICULOCYTES
RBC.: 3.71 MIL/uL — ABNORMAL LOW (ref 3.87–5.11)
Retic Count, Absolute: 63.1 10*3/uL (ref 19.0–186.0)
Retic Ct Pct: 1.7 % (ref 0.4–3.1)

## 2015-03-25 LAB — CBC
HCT: 31.5 % — ABNORMAL LOW (ref 36.0–46.0)
Hemoglobin: 10.3 g/dL — ABNORMAL LOW (ref 12.0–15.0)
MCH: 28.9 pg (ref 26.0–34.0)
MCHC: 32.7 g/dL (ref 30.0–36.0)
MCV: 88.2 fL (ref 78.0–100.0)
Platelets: 280 10*3/uL (ref 150–400)
RBC: 3.57 MIL/uL — ABNORMAL LOW (ref 3.87–5.11)
RDW: 13 % (ref 11.5–15.5)
WBC: 10.1 10*3/uL (ref 4.0–10.5)

## 2015-03-25 LAB — BASIC METABOLIC PANEL
Anion gap: 7 (ref 5–15)
BUN: <5 mg/dL — ABNORMAL LOW (ref 6–20)
CO2: 31 mmol/L (ref 22–32)
Calcium: 8 mg/dL — ABNORMAL LOW (ref 8.9–10.3)
Chloride: 97 mmol/L — ABNORMAL LOW (ref 101–111)
Creatinine, Ser: 0.74 mg/dL (ref 0.44–1.00)
GFR calc Af Amer: >60 mL/min (ref >60)
GFR calc non Af Amer: >60 mL/min (ref >60)
Glucose, Bld: 98 mg/dL (ref 65–99)
Potassium: 2.9 mmol/L — ABNORMAL LOW (ref 3.5–5.1)
Sodium: 135 mmol/L (ref 135–145)

## 2015-03-25 LAB — FOLATE: Folate: 12.4 ng/mL (ref >5.9)

## 2015-03-25 LAB — FERRITIN: Ferritin: 70 ng/mL (ref 11–307)

## 2015-03-25 LAB — VITAMIN B12: Vitamin B-12: 1074 pg/mL — ABNORMAL HIGH (ref 180–914)

## 2015-03-25 LAB — MAGNESIUM: Magnesium: 1.7 mg/dL (ref 1.7–2.4)

## 2015-03-25 MED ORDER — POTASSIUM CHLORIDE CRYS ER 20 MEQ PO TBCR: 20.0000 meq | EXTENDED_RELEASE_TABLET | Freq: Two times a day (BID) | ORAL | Status: DC

## 2015-03-25 MED ORDER — POTASSIUM CHLORIDE CRYS ER 20 MEQ PO TBCR
40.0000 meq | EXTENDED_RELEASE_TABLET | Freq: Two times a day (BID) | ORAL | Status: DC
  Administered 2015-03-25 – 2015-03-30 (×11): 40 meq via ORAL
  Filled 2015-03-25 (×12): qty 2

## 2015-03-25 MED ORDER — POLYETHYLENE GLYCOL 3350 17 G PO PACK
17.0000 g | PACK | Freq: Every day | ORAL | Status: DC
  Administered 2015-03-25 – 2015-03-27 (×3): 17 g via ORAL
  Filled 2015-03-25 (×3): qty 1

## 2015-03-25 MED ORDER — POTASSIUM CHLORIDE CRYS ER 20 MEQ PO TBCR: 40.0000 meq | EXTENDED_RELEASE_TABLET | Freq: Once | ORAL | Status: DC

## 2015-03-25 MED ORDER — FAMOTIDINE 20 MG PO TABS
20.0000 mg | ORAL_TABLET | Freq: Two times a day (BID) | ORAL | Status: DC
  Administered 2015-03-25 – 2015-03-30 (×11): 20 mg via ORAL
  Filled 2015-03-25 (×11): qty 1

## 2015-03-25 MED ORDER — ONDANSETRON HCL 4 MG PO TABS
4.0000 mg | ORAL_TABLET | Freq: Three times a day (TID) | ORAL | Status: DC
  Administered 2015-03-25 – 2015-03-30 (×16): 4 mg via ORAL
  Filled 2015-03-25 (×16): qty 1

## 2015-03-25 MED ORDER — SACCHAROMYCES BOULARDII 250 MG PO CAPS
250.0000 mg | ORAL_CAPSULE | Freq: Two times a day (BID) | ORAL | Status: DC
  Administered 2015-03-25 – 2015-03-30 (×11): 250 mg via ORAL
  Filled 2015-03-25 (×12): qty 1

## 2015-03-25 NOTE — Progress Notes (Signed)
Patient reported nausea and vomiting tonight. Zofran given did decrease nausea. Patient refused PO antibiotics due to N/V. Will continue to monitor. Jimmie Molly, RN

## 2015-03-25 NOTE — Progress Notes (Signed)
  Subjective: She had some nausea after switching over to PO antibiotics yesterday.  She is going to try it with Zofran first.  She went back to full liquids because she felt bad.  Still had some discomfort lower abdomen but much better.  Objective: Vital signs in last 24 hours: Temp:  [98.1 F (36.7 C)-98.7 F (37.1 C)] 98.1 F (36.7 C) (10/08 0431) Pulse Rate:  [69-74] 73 (10/08 0431) Resp:  [18] 18 (10/08 0431) BP: (113-140)/(53-76) 113/53 mmHg (10/08 0431) SpO2:  [99 %-100 %] 99 % (10/08 0431) Last BM Date: 03/24/15 360 Po yesterday recorded  Diet: full liquids Afebbrile, VSS WBC continue to improve  Intake/Output from previous day: 10/07 0701 - 10/08 0700 In: 360 [P.O.:360] Out: -  Intake/Output this shift: Total I/O In: 225 [P.O.:222; I.V.:3] Out: -   General appearance: alert, cooperative and no distress GI: soft, not really tender, still feels something lower abdomen.  No Bm so far.  Lab Results:   Recent Labs  03/24/15 0425 03/25/15 0410  WBC 11.0* 10.1  HGB 11.2* 10.3*  HCT 34.0* 31.5*  PLT 278 280    BMET  Recent Labs  03/23/15 0511 03/24/15 0425  NA 137 140  K 3.4* 4.1  CL 100* 99*  CO2 26 30  GLUCOSE 91 91  BUN 5* <5*  CREATININE 0.74 0.82  CALCIUM 8.1* 8.4*   PT/INR No results for input(s): LABPROT, INR in the last 72 hours.   Recent Labs Lab 03/20/15 1315 03/20/15 1732  AST 25 23  ALT 22 21  ALKPHOS 81 82  BILITOT 1.6* 1.5*  PROT 8.1 7.7  ALBUMIN 3.1* 3.0*     Lipase     Component Value Date/Time   LIPASE 19* 03/20/2015 1732     Studies/Results: No results found.  Medications: . aspirin EC  81 mg Oral Daily  . ciprofloxacin  500 mg Oral BID  . famotidine  20 mg Oral BID  . heparin  5,000 Units Subcutaneous 3 times per day  . hydrochlorothiazide  12.5 mg Oral Daily  .  HYDROmorphone (DILAUDID) injection  1 mg Intravenous Once  . irbesartan  150 mg Oral Daily  . metroNIDAZOLE  500 mg Oral Q8H  . ondansetron  4  mg Oral TID  . sodium chloride  3 mL Intravenous Q12H  . sodium chloride  3 mL Intravenous Q12H    Assessment/Plan Acute sigmoid diverticulitis with abscess GERD Hypertension Body mass index is 43.5 Antibiotics: Day 6 antibiotics  4 days of IV Flagyl/Rocephin/  started on oral Cipro/Flagyl 03/24/15 DVT: Heparin/SCD    Plan:  Dr. Ree Kida is trying her on some zofran before PO antibiotics, her WBC is improving on current Rx.  I will leave her on Fulls till we see how she does with PO antibiotics, add probiotics.      LOS: 5 days    Veronica [name] 03/25/2015

## 2015-03-25 NOTE — Progress Notes (Signed)
Subjective: Pt has a bad night  With nausea  And has not had a good BM  Abdominal  pain about the same   Objective: Vital signs in last 24 hours: Temp:  [98.1 F (36.7 C)-98.7 F (37.1 C)] 98.1 F (36.7 C) (10/08 0431) Pulse Rate:  [69-74] 73 (10/08 0431) Resp:  [18] 18 (10/08 0431) BP: (113-140)/(53-76) 113/53 mmHg (10/08 0431) SpO2:  [99 %-100 %] 99 % (10/08 0431) Last BM Date: 03/24/15  Intake/Output from previous day: 10/07 0701 - 10/08 0700 In: 360 [P.O.:360] Out: -  Intake/Output this shift: Total I/O In: 225 [P.O.:222; I.V.:3] Out: -   GI: mild TTP no peritonitis LLQ  Lab Results:   Recent Labs  03/24/15 0425 03/25/15 0410  WBC 11.0* 10.1  HGB 11.2* 10.3*  HCT 34.0* 31.5*  PLT 278 280   BMET  Recent Labs  03/24/15 0425 03/25/15 0933  NA 140 135  K 4.1 2.9*  CL 99* 97*  CO2 30 31  GLUCOSE 91 98  BUN <5* <5*  CREATININE 0.82 0.74  CALCIUM 8.4* 8.0*   PT/INR No results for input(s): LABPROT, INR in the last 72 hours. ABG No results for input(s): PHART, HCO3 in the last 72 hours.  Invalid input(s): PCO2, PO2  Studies/Results: No results found.  Anti-infectives: Anti-infectives    Start     Dose/Rate Route Frequency Ordered Stop   03/24/15 0930  ciprofloxacin (CIPRO) tablet 500 mg     500 mg Oral 2 times daily 03/24/15 0856     03/24/15 0930  metroNIDAZOLE (FLAGYL) tablet 500 mg     500 mg Oral Every 8 hours 03/24/15 0856     03/21/15 1000  ciprofloxacin (CIPRO) IVPB 400 mg  Status:  Discontinued     400 mg 200 mL/hr over 60 Minutes Intravenous Every 12 hours 03/21/15 0245 03/21/15 0714   03/21/15 0730  cefTRIAXone (ROCEPHIN) 2 g in dextrose 5 % 50 mL IVPB  Status:  Discontinued    Comments:  Pharmacy may adjust dosing strength / duration / interval for maximal efficacy   2 g 100 mL/hr over 30 Minutes Intravenous Every 24 hours 03/21/15 0715 03/24/15 0856   03/21/15 0600  metroNIDAZOLE (FLAGYL) IVPB 500 mg  Status:  Discontinued      500 mg 100 mL/hr over 60 Minutes Intravenous Every 8 hours 03/21/15 0245 03/24/15 0856   03/20/15 2030  ciprofloxacin (CIPRO) IVPB 400 mg     400 mg 200 mL/hr over 60 Minutes Intravenous  Once 03/20/15 2022 03/20/15 2219   03/20/15 2030  metroNIDAZOLE (FLAGYL) IVPB 500 mg     500 mg 100 mL/hr over 60 Minutes Intravenous  Once 03/20/15 2022 03/22/15 0809      Assessment/Plan: Patient Active Problem List   Diagnosis Date Noted  . Acute bilateral lower abdominal pain 03/20/2015  . Diverticulitis 03/20/2015  . Abdominal abscess ([place]) 03/20/2015  . Hyponatremia 03/20/2015  . Essential hypertension 03/20/2015  . Hypokalemia 03/20/2015  . Urinary frequency 03/17/2015  . Primary localized osteoarthrosis, lower leg 12/21/2013  . Bilateral knee pain 12/07/2013  . Peripheral neuropathy ([place]) 06/04/2013  . Right ankle pain 11/27/2012  . Dyspnea 07/18/2011  . Degenerative joint disease 12/27/2010  . GERD (gastroesophageal reflux disease) 12/27/2010  . Allergic rhinitis, cause unspecified 12/27/2010  . Carpal tunnel syndrome 12/27/2010  . Preventative health care 12/27/2010  . Hypertension   CONSTIPATION ADD MIRALAX check KUB   HYPOKALEMIA  Replace K  CT Sunday    Continue ABX  LOS: 5 days    Jase Himmelberger A. 03/25/2015

## 2015-03-25 NOTE — Progress Notes (Signed)
[facility]                                                                              Patient Demographics  Veronica [name], is a 64 y.o. female, DOB - Mar 29, 1951, [place] date - 03/20/2015   Admitting Physician Waldemar Dickens, MD  Outpatient Primary MD for the patient is Cathlean Cower, MD  LOS - 5   Chief Complaint  Patient presents with  . Abdominal Pain      HPI on 03/20/2015 by Dr. Linna Darner  Veronica [name] is a 64 y.o. female  Abdominal pain. Started 1 week ago. Getting worse. Comes and goes. Left lower quadrant initially but now more generalized. Associated with watery diarrhea. Denies bloody bowel movements, vomiting, fevers, dysuria, frequency. Pain is worse with meals. Some improvement after trying Dulcolax couple of days ago.  Assessment & Plan   Acute sigmoid diverticulitis with abscess -CT showing diverticulitis of the lower sigmoid and rectum with 4x3 cm fluid collection in the midline pelvic region suspicious for abscess.  -General surgery consulted and appreciated; Recommend IV antibiotics and possible IR drainage -Abscess not amendable to drainage  -Continue Cipro/Flagyl- transitioned to orals yesterday, having nausea -Continue conservative management, antibiotics, pain control -Will order zofran prior to antibiotics and see if this alleviates or improves nausea -KUB pending -Per surgery, may need repeat CT abd 10/9  Hypertension -Patient has reasonable blood pressure controlled -Continue avapro, HCTZ  Hyponatremia -Resolved, Sodium was 129 upon admission, currently 135 -Continue to monitor BMP   Hypokalemia -Likely secondary to diuretics vs GI losses -Will check magnesium -Continue to monitor BMP and replace as needed  Normocytic Anemia -Drop in Hb possibly due to dilutional component. -Anemia panel and FOBT ordered  Code Status: Full  Family Communication: Daughter at bedside  Disposition Plan: Admitted.   Time  Spent in minutes   30 minutes  Procedures  None  Consults   General surgery Interventional radiology  DVT Prophylaxis  heparin  Lab Results  Component Value Date   PLT 280 03/25/2015    Medications  Scheduled Meds: . aspirin EC  81 mg Oral Daily  . ciprofloxacin  500 mg Oral BID  . famotidine  20 mg Oral BID  . heparin  5,000 Units Subcutaneous 3 times per day  . hydrochlorothiazide  12.5 mg Oral Daily  .  HYDROmorphone (DILAUDID) injection  1 mg Intravenous Once  . irbesartan  150 mg Oral Daily  . metroNIDAZOLE  500 mg Oral Q8H  . ondansetron  4 mg Oral TID  . polyethylene glycol  17 g Oral Daily  . potassium chloride SA  20 mEq Oral BID  . saccharomyces boulardii  250 mg Oral BID  . sodium chloride  3 mL Intravenous Q12H  . sodium chloride  3 mL Intravenous Q12H   Continuous Infusions:   PRN Meds:.acetaminophen, alum & mag hydroxide-simeth, hydrALAZINE, HYDROcodone-acetaminophen, morphine injection, ondansetron (ZOFRAN) IV, sodium chloride  Antibiotics    Anti-infectives    Start     Dose/Rate Route Frequency Ordered Stop   03/24/15 0930  ciprofloxacin (CIPRO) tablet 500 mg     500 mg Oral 2 times daily 03/24/15 0856  03/24/15 0930  metroNIDAZOLE (FLAGYL) tablet 500 mg     500 mg Oral Every 8 hours 03/24/15 0856     03/21/15 1000  ciprofloxacin (CIPRO) IVPB 400 mg  Status:  Discontinued     400 mg 200 mL/hr over 60 Minutes Intravenous Every 12 hours 03/21/15 0245 03/21/15 0714   03/21/15 0730  cefTRIAXone (ROCEPHIN) 2 g in dextrose 5 % 50 mL IVPB  Status:  Discontinued    Comments:  Pharmacy may adjust dosing strength / duration / interval for maximal efficacy   2 g 100 mL/hr over 30 Minutes Intravenous Every 24 hours 03/21/15 0715 03/24/15 0856   03/21/15 0600  metroNIDAZOLE (FLAGYL) IVPB 500 mg  Status:  Discontinued     500 mg 100 mL/hr over 60 Minutes Intravenous Every 8 hours 03/21/15 0245 03/24/15 0856   03/20/15 2030  ciprofloxacin (CIPRO) IVPB 400  mg     400 mg 200 mL/hr over 60 Minutes Intravenous  Once 03/20/15 2022 03/20/15 2219   03/20/15 2030  metroNIDAZOLE (FLAGYL) IVPB 500 mg     500 mg 100 mL/hr over 60 Minutes Intravenous  Once 03/20/15 2022 03/22/15 0809      Subjective:   Veronica [name] seen and examined today.  Patient continues to have some abdominal pain and has had nausea since starting oral antibiotics.  States she had one episode of vomiting last night.  Denies diarrhea today.  Denies chest pain, shortness of breath, dizziness or headache.  Objective:   Filed Vitals:   03/24/15 0929 03/24/15 1400 03/24/15 2107 03/25/15 0431  BP: 127/76 130/76 140/65 113/53  Pulse: 79 74 69 73  Temp: 98.5 F (36.9 C) 98.1 F (36.7 C) 98.7 F (37.1 C) 98.1 F (36.7 C)  TempSrc: Oral Oral Oral Oral  Resp: 15 18 18 18   Height:      Weight:      SpO2: 98% 100% 100% 99%    Wt Readings from Last 3 Encounters:  03/20/15 122.335 kg (269 lb 11.2 oz)  03/20/15 121.11 kg (267 lb)  03/17/15 121.564 kg (268 lb)     Intake/Output Summary (Last 24 hours) at 03/25/15 1110 Last data filed at 03/25/15 0954  Gross per 24 hour  Intake    225 ml  Output      0 ml  Net    225 ml    Exam  General: Well developed, well nourished, NAD  HEENT: NCAT,  mucous membranes moist.   Cardiovascular: S1 S2 auscultated, RRR, no murmurs  Respiratory: Clear to auscultation bilaterally   Abdomen: Soft, nontender, nondistended, +BS.  Extremities: warm dry without cyanosis clubbing or edema  Neuro: AAOx3, nonfocal  Psych: Appropriate mood and affect, pleasant  Data Review   Micro Results Recent Results (from the past 240 hour(s))  Urine culture     Status: None   Collection Time: 03/17/15  4:02 PM  Result Value Ref Range Status   Colony Count 30,000 COLONIES/ML  Final   Organism ID, Bacteria GROUP B STREP (S.AGALACTIAE) ISOLATED  Final    Comment: Testing against S. agalactiae not routinely performed due to predictability  of AMP/PEN/VAN susceptibility.     Radiology Reports Ct Abdomen Pelvis W Contrast  03/20/2015   ADDENDUM REPORT: 03/20/2015 16:15 ADDENDUM: These results were called by telephone at the time of interpretation on 03/20/2015 at 4:14 pm to Dr. Thersa Salt , who verbally acknowledged these results. Electronically Signed   By: Jerilynn Mages.  Shick M.D.   On: 03/20/2015 16:15  03/20/2015   CLINICAL DATA:  Acute lower abdominal and pelvic pain, dysuria, rectal pain, elevated white count.  EXAM: CT ABDOMEN AND PELVIS WITH CONTRAST  TECHNIQUE: Multidetector CT imaging of the abdomen and pelvis was performed using the standard protocol following bolus administration of intravenous contrast.  CONTRAST:  18mL OMNIPAQUE IOHEXOL 300 MG/ML  SOLN  COMPARISON:  None.  FINDINGS: Lower chest: Healed right lower rib fractures with deformity. Lung bases remain clear. Normal heart size. No pericardial or pleural effusion. Degenerative changes of the lower thoracic spine. Small hiatal hernia evident.  Abdomen: Prior cholecystectomy. Small midline fat containing umbilical hernia with an abdominal defect measuring 4.5 cm, image 43. This is just above the umbilicus.  Liver, biliary system, pancreas, spleen, adrenal glands, and kidneys are within normal limits for age and demonstrate no acute process.  Negative for bowel obstruction, dilatation, ileus, or significant free air.  Retro aortic left renal vein noted, normal variant.  Intact aorta. Negative for aneurysm. No acute retroperitoneal process.  Pelvis: Diverticulosis present of the colon. Lower sigmoid and rectum demonstrates wall thickening with surrounding edema and inflammation compatible with diverticulitis. Difficult to exclude a mural lesion. Deep within the pelvis, 2 small fluid collections are present 1 the midline measures 39 x 31 mm posterior to the uterus image 65. Left adnexal small fluid collection versus ovarian cyst measures 30 x 21 mm, image 63. Urinary bladder collapsed.  Uterus is tilted to the right.  No adenopathy , inguinal abnormality, or inguinal hernia.  Degenerative changes of the lower lumbar spine and SI joints.  IMPRESSION: Acute diverticulitis of the lower sigmoid colon and rectum.  Midline pelvic small fluid collection measuring 4 x 3 cm suspicious for associated abscess  Left adnexal small fluid collection versus cystic area along the left ovary could represent a second abscess versus a small ovarian cyst measuring 30 x 21 mm.  No associated obstruction or ileus  Small fat containing ventral hernia superior to the umbilicus.  Prior cholecystectomy  Small hiatal hernia  Electronically Signed: By: Jerilynn Mages.  Shick M.D. On: 03/20/2015 15:55    CBC  Recent Labs Lab 03/21/15 0515 03/22/15 0520 03/23/15 0511 03/24/15 0425 03/25/15 0410  WBC 13.1* 13.5* 13.2* 11.0* 10.1  HGB 12.2 11.2* 11.2* 11.2* 10.3*  HCT 36.9 33.2* 34.0* 34.0* 31.5*  PLT 274 265 280 278 280  MCV 88.3 88.1 88.1 88.8 88.2  MCH 29.2 29.7 29.0 29.2 28.9  MCHC 33.1 33.7 32.9 32.9 32.7  RDW 13.0 13.0 12.9 13.0 13.0    Chemistries   Recent Labs Lab 03/20/15 1315 03/20/15 1732 03/21/15 0515 03/23/15 0511 03/24/15 0425 03/25/15 0933  NA 136 129* 135 137 140 135  K 2.9* 2.9* 3.7 3.4* 4.1 2.9*  CL 96* 91* 101 100* 99* 97*  CO2 28 27 28 26 30 31   GLUCOSE 94 112* 91 91 91 98  BUN 7 8 7  5* <5* <5*  CREATININE 0.83 0.93 0.88 0.74 0.82 0.74  CALCIUM 8.9 8.6* 8.2* 8.1* 8.4* 8.0*  AST 25 23  --   --   --   --   ALT 22 21  --   --   --   --   ALKPHOS 81 82  --   --   --   --   BILITOT 1.6* 1.5*  --   --   --   --    ------------------------------------------------------------------------------------------------------------------ estimated creatinine clearance is 93.5 mL/min (by C-G formula based on Cr of 0.74). ------------------------------------------------------------------------------------------------------------------ No results  for input(s): HGBA1C in the last 72  hours. ------------------------------------------------------------------------------------------------------------------ No results for input(s): CHOL, HDL, LDLCALC, TRIG, CHOLHDL, LDLDIRECT in the last 72 hours. ------------------------------------------------------------------------------------------------------------------ No results for input(s): TSH, T4TOTAL, T3FREE, THYROIDAB in the last 72 hours.  Invalid input(s): FREET3 ------------------------------------------------------------------------------------------------------------------ No results for input(s): VITAMINB12, FOLATE, FERRITIN, TIBC, IRON, RETICCTPCT in the last 72 hours.  Coagulation profile  Recent Labs Lab 03/21/15 0515  INR 1.32    No results for input(s): DDIMER in the last 72 hours.  Cardiac Enzymes No results for input(s): CKMB, TROPONINI, MYOGLOBIN in the last 168 hours.  Invalid input(s): CK ------------------------------------------------------------------------------------------------------------------ Invalid input(s): POCBNP    Allen Egerton D.O. on 03/25/2015 at 11:10 AM  Between 7am to 7pm - Pager - [phone]  After 7pm go to [email or web address] - password [email or web address]  And look for the night coverage person covering for me after hours  [facility] Office  [phone]

## 2015-03-26 ENCOUNTER — Inpatient Hospital Stay (HOSPITAL_COMMUNITY): Payer: BC Managed Care – PPO

## 2015-03-26 LAB — BASIC METABOLIC PANEL
Anion gap: 6 (ref 5–15)
BUN: <5 mg/dL — ABNORMAL LOW (ref 6–20)
CO2: 33 mmol/L — ABNORMAL HIGH (ref 22–32)
Calcium: 8.1 mg/dL — ABNORMAL LOW (ref 8.9–10.3)
Chloride: 97 mmol/L — ABNORMAL LOW (ref 101–111)
Creatinine, Ser: 0.82 mg/dL (ref 0.44–1.00)
GFR calc Af Amer: >60 mL/min (ref >60)
GFR calc non Af Amer: >60 mL/min (ref >60)
Glucose, Bld: 93 mg/dL (ref 65–99)
Potassium: 3.9 mmol/L (ref 3.5–5.1)
Sodium: 136 mmol/L (ref 135–145)

## 2015-03-26 LAB — CBC
HCT: 34.2 % — ABNORMAL LOW (ref 36.0–46.0)
Hemoglobin: 10.9 g/dL — ABNORMAL LOW (ref 12.0–15.0)
MCH: 28.5 pg (ref 26.0–34.0)
MCHC: 31.9 g/dL (ref 30.0–36.0)
MCV: 89.3 fL (ref 78.0–100.0)
Platelets: 301 10*3/uL (ref 150–400)
RBC: 3.83 MIL/uL — ABNORMAL LOW (ref 3.87–5.11)
RDW: 13.3 % (ref 11.5–15.5)
WBC: 8.7 10*3/uL (ref 4.0–10.5)

## 2015-03-26 MED ORDER — MAGNESIUM SULFATE 2 GM/50ML IV SOLN
2.0000 g | Freq: Once | INTRAVENOUS | Status: AC
  Administered 2015-03-26: 2 g via INTRAVENOUS
  Filled 2015-03-26: qty 50

## 2015-03-26 MED ORDER — IOHEXOL 300 MG/ML  SOLN
25.0000 mL | INTRAMUSCULAR | Status: AC
  Administered 2015-03-26: 50 mL via ORAL

## 2015-03-26 MED ORDER — IOHEXOL 300 MG/ML  SOLN
100.0000 mL | Freq: Once | INTRAMUSCULAR | Status: AC | PRN
  Administered 2015-03-26: 100 mL via INTRAVENOUS

## 2015-03-26 NOTE — Progress Notes (Signed)
  Subjective: She feels pretty good, soft diet, not really tender this AM.    Objective: Vital signs in last 24 hours: Temp:  [97.9 F (36.6 C)-98.5 F (36.9 C)] 97.9 F (36.6 C) (10/09 0558) Pulse Rate:  [67-73] 73 (10/09 0558) Resp:  [17-18] 18 (10/09 0558) BP: (121-136)/(58-70) 136/70 mmHg (10/09 0558) SpO2:  [96 %-100 %] 97 % (10/09 0558) Last BM Date: 03/24/15 578 PO yesterday Afebrile, VSS labs OK Plan CT today Intake/Output from previous day: 10/08 0701 - 10/09 0700 In: 581 [P.O.:578; I.V.:3] Out: -  Intake/Output this shift: Total I/O In: 240 [P.O.:240] Out: -   General appearance: alert, cooperative and no distress GI: soft, non-tender; bowel sounds normal; no masses,  no organomegaly  Lab Results:   Recent Labs  03/25/15 0410 03/26/15 0806  WBC 10.1 8.7  HGB 10.3* 10.9*  HCT 31.5* 34.2*  PLT 280 301    BMET  Recent Labs  03/25/15 0933 03/26/15 0806  NA 135 136  K 2.9* 3.9  CL 97* 97*  CO2 31 33*  GLUCOSE 98 93  BUN <5* <5*  CREATININE 0.74 0.82  CALCIUM 8.0* 8.1*   PT/INR No results for input(s): LABPROT, INR in the last 72 hours.   Recent Labs Lab 03/20/15 1315 03/20/15 1732  AST 25 23  ALT 22 21  ALKPHOS 81 82  BILITOT 1.6* 1.5*  PROT 8.1 7.7  ALBUMIN 3.1* 3.0*     Lipase     Component Value Date/Time   LIPASE 19* 03/20/2015 1732     Studies/Results: Dg Abd 1 View  03/25/2015   CLINICAL DATA:  Abdominal pain. Constipation. Recent acute sigmoid diverticulitis.  EXAM: ABDOMEN - 1 VIEW  COMPARISON:  03/20/2015 CT abdomen/ pelvis.  FINDINGS: There are no disproportionately dilated small bowel loops to suggest small bowel obstruction. There is mild-to-moderate stool throughout the colon. No evidence of pneumatosis or pneumoperitoneum. Cholecystectomy clips are seen in the right upper quadrant of the abdomen. Mild reversed S-shaped long segment thoracolumbar scoliosis with associated degenerative changes.  IMPRESSION:  Nonobstructive bowel gas pattern. Mild to moderate colonic stool volume.   Electronically Signed   By: Ilona Sorrel M.D.   On: 03/25/2015 12:12    Medications: . aspirin EC  81 mg Oral Daily  . ciprofloxacin  500 mg Oral BID  . famotidine  20 mg Oral BID  . heparin  5,000 Units Subcutaneous 3 times per day  . hydrochlorothiazide  12.5 mg Oral Daily  .  HYDROmorphone (DILAUDID) injection  1 mg Intravenous Once  . iohexol  25 mL Oral Q1 Hr x 2  . irbesartan  150 mg Oral Daily  . metroNIDAZOLE  500 mg Oral Q8H  . ondansetron  4 mg Oral TID  . polyethylene glycol  17 g Oral Daily  . potassium chloride  40 mEq Oral BID  . saccharomyces boulardii  250 mg Oral BID  . sodium chloride  3 mL Intravenous Q12H  . sodium chloride  3 mL Intravenous Q12H    Assessment/Plan Acute sigmoid diverticulitis with abscess GERD Hypertension Body mass index is 43.5 Antibiotics: Day 6 antibiotics 4 days of IV Flagyl/Rocephin/ started on oral Cipro/Flagyl 03/24/15 DVT: Heparin/SCD    Plan:  We are getting the CT this Am, will decided on d/c after CT.     LOS: 6 days    Veronica [name] 03/26/2015

## 2015-03-26 NOTE — Progress Notes (Signed)
[facility]                                                                              Patient Demographics  Veronica [name], is a 64 y.o. female, DOB - Mar 08, 1951, [place] date - 03/20/2015   Admitting Physician Waldemar Dickens, MD  Outpatient Primary MD for the patient is Cathlean Cower, MD  LOS - 6   Chief Complaint  Patient presents with  . Abdominal Pain      HPI on 03/20/2015 by Dr. Linna Darner  Veronica [name] is a 64 y.o. female  Abdominal pain. Started 1 week ago. Getting worse. Comes and goes. Left lower quadrant initially but now more generalized. Associated with watery diarrhea. Denies bloody bowel movements, vomiting, fevers, dysuria, frequency. Pain is worse with meals. Some improvement after trying Dulcolax couple of days ago.  Assessment & Plan   Acute sigmoid diverticulitis with abscess -CT showing diverticulitis of the lower sigmoid and rectum with 4x3 cm fluid collection in the midline pelvic region suspicious for abscess.  -General surgery consulted and appreciated; Recommend IV antibiotics and possible IR drainage -Abscess not amendable to drainage  -Continue Cipro/Flagyl- transitioned to orals yesterday, having nausea -Continue conservative management, antibiotics, pain control -Will order zofran prior to antibiotics and see if this alleviates or improves nausea -KUB: nonobstructive bowel gas pattern, mild-moderate colonic stool voume -Continue miralax -Per surgery:Repeat CT abd today, if improved, may be discharged home on 10/10  Hypertension -Patient has reasonable blood pressure controlled -Continue avapro, HCTZ  Hyponatremia -Resolved, Sodium was 129 upon admission, currently 136 -Continue to monitor BMP   Hypokalemia -Resolved, Likely secondary to diuretics vs GI losses -Magnesium 1.7, will replace -Continue to monitor BMP and replace as needed  Normocytic Anemia -Drop in Hb possibly due to dilutional  component. -Anemia panel: Iron 52, Ferritin 70 -Hb 10.9, stable,continue to monitor  Code Status: Full  Family Communication: Daughter at bedside  Disposition Plan: Admitted. Possible d/c home 10/10  Time Spent in minutes   30 minutes  Procedures  None  Consults   General surgery Interventional radiology  DVT Prophylaxis  heparin  Lab Results  Component Value Date   PLT 301 03/26/2015    Medications  Scheduled Meds: . aspirin EC  81 mg Oral Daily  . ciprofloxacin  500 mg Oral BID  . famotidine  20 mg Oral BID  . heparin  5,000 Units Subcutaneous 3 times per day  . hydrochlorothiazide  12.5 mg Oral Daily  .  HYDROmorphone (DILAUDID) injection  1 mg Intravenous Once  . irbesartan  150 mg Oral Daily  . metroNIDAZOLE  500 mg Oral Q8H  . ondansetron  4 mg Oral TID  . polyethylene glycol  17 g Oral Daily  . potassium chloride  40 mEq Oral BID  . saccharomyces boulardii  250 mg Oral BID  . sodium chloride  3 mL Intravenous Q12H  . sodium chloride  3 mL Intravenous Q12H   Continuous Infusions:   PRN Meds:.acetaminophen, alum & mag hydroxide-simeth, hydrALAZINE, HYDROcodone-acetaminophen, morphine injection, ondansetron (ZOFRAN) IV, sodium chloride  Antibiotics    Anti-infectives    Start     Dose/Rate Route Frequency  Ordered Stop   03/24/15 0930  ciprofloxacin (CIPRO) tablet 500 mg     500 mg Oral 2 times daily 03/24/15 0856     03/24/15 0930  metroNIDAZOLE (FLAGYL) tablet 500 mg     500 mg Oral Every 8 hours 03/24/15 0856     03/21/15 1000  ciprofloxacin (CIPRO) IVPB 400 mg  Status:  Discontinued     400 mg 200 mL/hr over 60 Minutes Intravenous Every 12 hours 03/21/15 0245 03/21/15 0714   03/21/15 0730  cefTRIAXone (ROCEPHIN) 2 g in dextrose 5 % 50 mL IVPB  Status:  Discontinued    Comments:  Pharmacy may adjust dosing strength / duration / interval for maximal efficacy   2 g 100 mL/hr over 30 Minutes Intravenous Every 24 hours 03/21/15 0715 03/24/15 0856    03/21/15 0600  metroNIDAZOLE (FLAGYL) IVPB 500 mg  Status:  Discontinued     500 mg 100 mL/hr over 60 Minutes Intravenous Every 8 hours 03/21/15 0245 03/24/15 0856   03/20/15 2030  ciprofloxacin (CIPRO) IVPB 400 mg     400 mg 200 mL/hr over 60 Minutes Intravenous  Once 03/20/15 2022 03/20/15 2219   03/20/15 2030  metroNIDAZOLE (FLAGYL) IVPB 500 mg     500 mg 100 mL/hr over 60 Minutes Intravenous  Once 03/20/15 2022 03/22/15 0809      Subjective:   Veronica [name] seen and examined today.  Patient denies any further nausea or vomiting. Does feel constipated, however has had some loose stools. Denies chest pain, shortness of breath, dizziness or headache.  Objective:   Filed Vitals:   03/25/15 0431 03/25/15 1251 03/25/15 2112 03/26/15 0558  BP: 113/53 121/58 131/70 136/70  Pulse: 73 67 70 73  Temp: 98.1 F (36.7 C) 98 F (36.7 C) 98.5 F (36.9 C) 97.9 F (36.6 C)  TempSrc: Oral Oral Oral Oral  Resp: 18 17 17 18   Height:      Weight:      SpO2: 99% 100% 96% 97%    Wt Readings from Last 3 Encounters:  03/20/15 122.335 kg (269 lb 11.2 oz)  03/20/15 121.11 kg (267 lb)  03/17/15 121.564 kg (268 lb)     Intake/Output Summary (Last 24 hours) at 03/26/15 1232 Last data filed at 03/26/15 0917  Gross per 24 hour  Intake    596 ml  Output      0 ml  Net    596 ml    Exam  General: Well developed, well nourished, NAD  HEENT: NCAT,  mucous membranes moist.   Cardiovascular: S1 S2 auscultated, RRR, no murmurs  Respiratory: Clear to auscultation  Abdomen: Soft, obese, nontender, nondistended, +BS.  Extremities: warm dry without cyanosis clubbing or edema  Neuro: AAOx3, nonfocal  Psych: Appropriate mood and affect, pleasant  Data Review   Micro Results Recent Results (from the past 240 hour(s))  Urine culture     Status: None   Collection Time: 03/17/15  4:02 PM  Result Value Ref Range Status   Colony Count 30,000 COLONIES/ML  Final   Organism ID, Bacteria  GROUP B STREP (S.AGALACTIAE) ISOLATED  Final    Comment: Testing against S. agalactiae not routinely performed due to predictability of AMP/PEN/VAN susceptibility.     Radiology Reports Dg Abd 1 View  03/25/2015   CLINICAL DATA:  Abdominal pain. Constipation. Recent acute sigmoid diverticulitis.  EXAM: ABDOMEN - 1 VIEW  COMPARISON:  03/20/2015 CT abdomen/ pelvis.  FINDINGS: There are no disproportionately dilated small bowel loops to  suggest small bowel obstruction. There is mild-to-moderate stool throughout the colon. No evidence of pneumatosis or pneumoperitoneum. Cholecystectomy clips are seen in the right upper quadrant of the abdomen. Mild reversed S-shaped long segment thoracolumbar scoliosis with associated degenerative changes.  IMPRESSION: Nonobstructive bowel gas pattern. Mild to moderate colonic stool volume.   Electronically Signed   By: Ilona Sorrel M.D.   On: 03/25/2015 12:12   Ct Abdomen Pelvis W Contrast  03/20/2015   ADDENDUM REPORT: 03/20/2015 16:15 ADDENDUM: These results were called by telephone at the time of interpretation on 03/20/2015 at 4:14 pm to Dr. Thersa Salt , who verbally acknowledged these results. Electronically Signed   By: Jerilynn Mages.  Shick M.D.   On: 03/20/2015 16:15  03/20/2015   CLINICAL DATA:  Acute lower abdominal and pelvic pain, dysuria, rectal pain, elevated white count.  EXAM: CT ABDOMEN AND PELVIS WITH CONTRAST  TECHNIQUE: Multidetector CT imaging of the abdomen and pelvis was performed using the standard protocol following bolus administration of intravenous contrast.  CONTRAST:  147mL OMNIPAQUE IOHEXOL 300 MG/ML  SOLN  COMPARISON:  None.  FINDINGS: Lower chest: Healed right lower rib fractures with deformity. Lung bases remain clear. Normal heart size. No pericardial or pleural effusion. Degenerative changes of the lower thoracic spine. Small hiatal hernia evident.  Abdomen: Prior cholecystectomy. Small midline fat containing umbilical hernia with an abdominal defect  measuring 4.5 cm, image 43. This is just above the umbilicus.  Liver, biliary system, pancreas, spleen, adrenal glands, and kidneys are within normal limits for age and demonstrate no acute process.  Negative for bowel obstruction, dilatation, ileus, or significant free air.  Retro aortic left renal vein noted, normal variant.  Intact aorta. Negative for aneurysm. No acute retroperitoneal process.  Pelvis: Diverticulosis present of the colon. Lower sigmoid and rectum demonstrates wall thickening with surrounding edema and inflammation compatible with diverticulitis. Difficult to exclude a mural lesion. Deep within the pelvis, 2 small fluid collections are present 1 the midline measures 39 x 31 mm posterior to the uterus image 65. Left adnexal small fluid collection versus ovarian cyst measures 30 x 21 mm, image 63. Urinary bladder collapsed. Uterus is tilted to the right.  No adenopathy , inguinal abnormality, or inguinal hernia.  Degenerative changes of the lower lumbar spine and SI joints.  IMPRESSION: Acute diverticulitis of the lower sigmoid colon and rectum.  Midline pelvic small fluid collection measuring 4 x 3 cm suspicious for associated abscess  Left adnexal small fluid collection versus cystic area along the left ovary could represent a second abscess versus a small ovarian cyst measuring 30 x 21 mm.  No associated obstruction or ileus  Small fat containing ventral hernia superior to the umbilicus.  Prior cholecystectomy  Small hiatal hernia  Electronically Signed: By: Jerilynn Mages.  Shick M.D. On: 03/20/2015 15:55    CBC  Recent Labs Lab 03/22/15 0520 03/23/15 0511 03/24/15 0425 03/25/15 0410 03/26/15 0806  WBC 13.5* 13.2* 11.0* 10.1 8.7  HGB 11.2* 11.2* 11.2* 10.3* 10.9*  HCT 33.2* 34.0* 34.0* 31.5* 34.2*  PLT 265 280 278 280 301  MCV 88.1 88.1 88.8 88.2 89.3  MCH 29.7 29.0 29.2 28.9 28.5  MCHC 33.7 32.9 32.9 32.7 31.9  RDW 13.0 12.9 13.0 13.0 13.3    Chemistries   Recent Labs Lab  03/20/15 1315 03/20/15 1732 03/21/15 0515 03/23/15 0511 03/24/15 0425 03/25/15 0933 03/25/15 1055 03/26/15 0806  NA 136 129* 135 137 140 135  --  136  K 2.9* 2.9* 3.7 3.4*  4.1 2.9*  --  3.9  CL 96* 91* 101 100* 99* 97*  --  97*  CO2 28 27 28 26 30 31   --  33*  GLUCOSE 94 112* 91 91 91 98  --  93  BUN 7 8 7  5* <5* <5*  --  <5*  CREATININE 0.83 0.93 0.88 0.74 0.82 0.74  --  0.82  CALCIUM 8.9 8.6* 8.2* 8.1* 8.4* 8.0*  --  8.1*  MG  --   --   --   --   --   --  1.7  --   AST 25 23  --   --   --   --   --   --   ALT 22 21  --   --   --   --   --   --   ALKPHOS 81 82  --   --   --   --   --   --   BILITOT 1.6* 1.5*  --   --   --   --   --   --    ------------------------------------------------------------------------------------------------------------------ estimated creatinine clearance is 91.2 mL/min (by C-G formula based on Cr of 0.82). ------------------------------------------------------------------------------------------------------------------ No results for input(s): HGBA1C in the last 72 hours. ------------------------------------------------------------------------------------------------------------------ No results for input(s): CHOL, HDL, LDLCALC, TRIG, CHOLHDL, LDLDIRECT in the last 72 hours. ------------------------------------------------------------------------------------------------------------------ No results for input(s): TSH, T4TOTAL, T3FREE, THYROIDAB in the last 72 hours.  Invalid input(s): FREET3 ------------------------------------------------------------------------------------------------------------------  Recent Labs  03/25/15 1150  VITAMINB12 1074*  FOLATE 12.4  FERRITIN 70  TIBC 244*  IRON 52  RETICCTPCT 1.7    Coagulation profile  Recent Labs Lab 03/21/15 0515  INR 1.32    No results for input(s): DDIMER in the last 72 hours.  Cardiac Enzymes No results for input(s): CKMB, TROPONINI, MYOGLOBIN in the last 168 hours.  Invalid  input(s): CK ------------------------------------------------------------------------------------------------------------------ Invalid input(s): POCBNP    Veronica [name] D.O. on 03/26/2015 at 12:32 PM  Between 7am to 7pm - Pager - [phone]  After 7pm go to [email or web address] - password [email or web address]  And look for the night coverage person covering for me after hours  [facility] Office  [phone]

## 2015-03-27 LAB — BASIC METABOLIC PANEL
Anion gap: 5 (ref 5–15)
BUN: <5 mg/dL — ABNORMAL LOW (ref 6–20)
CO2: 33 mmol/L — ABNORMAL HIGH (ref 22–32)
Calcium: 8.3 mg/dL — ABNORMAL LOW (ref 8.9–10.3)
Chloride: 101 mmol/L (ref 101–111)
Creatinine, Ser: 0.93 mg/dL (ref 0.44–1.00)
GFR calc Af Amer: >60 mL/min (ref >60)
GFR calc non Af Amer: >60 mL/min (ref >60)
Glucose, Bld: 88 mg/dL (ref 65–99)
Potassium: 4.4 mmol/L (ref 3.5–5.1)
Sodium: 139 mmol/L (ref 135–145)

## 2015-03-27 LAB — CBC
HCT: 33.3 % — ABNORMAL LOW (ref 36.0–46.0)
Hemoglobin: 10.4 g/dL — ABNORMAL LOW (ref 12.0–15.0)
MCH: 28.1 pg (ref 26.0–34.0)
MCHC: 31.2 g/dL (ref 30.0–36.0)
MCV: 90 fL (ref 78.0–100.0)
Platelets: 308 10*3/uL (ref 150–400)
RBC: 3.7 MIL/uL — ABNORMAL LOW (ref 3.87–5.11)
RDW: 13.5 % (ref 11.5–15.5)
WBC: 10.9 10*3/uL — ABNORMAL HIGH (ref 4.0–10.5)

## 2015-03-27 MED ORDER — HEPARIN SODIUM (PORCINE) 5000 UNIT/ML IJ SOLN
5000.0000 [IU] | Freq: Three times a day (TID) | INTRAMUSCULAR | Status: DC
  Administered 2015-03-27 – 2015-03-30 (×7): 5000 [IU] via SUBCUTANEOUS
  Filled 2015-03-27 (×7): qty 1

## 2015-03-27 NOTE — Progress Notes (Signed)
[facility]                                                                              Patient Demographics  Veronica [name], is a 64 y.o. female, DOB - 06-26-1950, [place] date - 03/20/2015   Admitting Physician Waldemar Dickens, MD  Outpatient Primary MD for the patient is Cathlean Cower, MD  LOS - 7   Chief Complaint  Patient presents with  . Abdominal Pain      HPI on 03/20/2015 by Dr. Linna Darner  Veronica [name] is a 64 y.o. female  Abdominal pain. Started 1 week ago. Getting worse. Comes and goes. Left lower quadrant initially but now more generalized. Associated with watery diarrhea. Denies bloody bowel movements, vomiting, fevers, dysuria, frequency. Pain is worse with meals. Some improvement after trying Dulcolax couple of days ago.  Assessment & Plan   Acute sigmoid diverticulitis with abscess -CT showing diverticulitis of the lower sigmoid and rectum with 4x3 cm fluid collection in the midline pelvic region suspicious for abscess.  -General surgery consulted and appreciated; Recommend IV antibiotics and possible IR drainage -Abscess not amendable to drainage  -Continue Cipro/Flagyl- transitioned to orals yesterday, having nausea -Continue conservative management, antibiotics, pain control -Will order zofran prior to antibiotics and see if this alleviates or improves nausea -KUB: nonobstructive bowel gas pattern, mild-moderate colonic stool voume -Continue miralax -Per surgery:Repeat CT abd: persistent 4.5cm fluid collection-likely abscess  -General surgery consulted IR for possible aspiration and drain placement on 10/11   Hypertension -Patient has reasonable blood pressure controlled -Continue avapro, HCTZ  Hyponatremia -Resolved, Sodium was 129 upon admission, currently 139 -Continue to monitor BMP   Hypokalemia -Resolved, Likely secondary to diuretics vs GI losses -Magnesium 1.7, replaced -Continue to monitor BMP and replace as  needed  Normocytic Anemia -Drop in Hb possibly due to dilutional component. -Anemia panel: Iron 52, Ferritin 70 -Hb 10.9, stable,continue to monitor  Code Status: Full  Family Communication: Daughter at bedside  Disposition Plan: Admitted. Possible aspiration and drain placement on 10/11  Time Spent in minutes   30 minutes  Procedures  None  Consults   General surgery Interventional radiology  DVT Prophylaxis  heparin  Lab Results  Component Value Date   PLT 308 03/27/2015    Medications  Scheduled Meds: . aspirin EC  81 mg Oral Daily  . ciprofloxacin  500 mg Oral BID  . famotidine  20 mg Oral BID  . heparin  5,000 Units Subcutaneous 3 times per day  . hydrochlorothiazide  12.5 mg Oral Daily  .  HYDROmorphone (DILAUDID) injection  1 mg Intravenous Once  . irbesartan  150 mg Oral Daily  . metroNIDAZOLE  500 mg Oral Q8H  . ondansetron  4 mg Oral TID  . potassium chloride  40 mEq Oral BID  . saccharomyces boulardii  250 mg Oral BID  . sodium chloride  3 mL Intravenous Q12H  . sodium chloride  3 mL Intravenous Q12H   Continuous Infusions:   PRN Meds:.acetaminophen, alum & mag hydroxide-simeth, hydrALAZINE, HYDROcodone-acetaminophen, morphine injection, ondansetron (ZOFRAN) IV, sodium chloride  Antibiotics    Anti-infectives    Start  Dose/Rate Route Frequency Ordered Stop   03/24/15 0930  ciprofloxacin (CIPRO) tablet 500 mg     500 mg Oral 2 times daily 03/24/15 0856     03/24/15 0930  metroNIDAZOLE (FLAGYL) tablet 500 mg     500 mg Oral Every 8 hours 03/24/15 0856     03/21/15 1000  ciprofloxacin (CIPRO) IVPB 400 mg  Status:  Discontinued     400 mg 200 mL/hr over 60 Minutes Intravenous Every 12 hours 03/21/15 0245 03/21/15 0714   03/21/15 0730  cefTRIAXone (ROCEPHIN) 2 g in dextrose 5 % 50 mL IVPB  Status:  Discontinued    Comments:  Pharmacy may adjust dosing strength / duration / interval for maximal efficacy   2 g 100 mL/hr over 30 Minutes  Intravenous Every 24 hours 03/21/15 0715 03/24/15 0856   03/21/15 0600  metroNIDAZOLE (FLAGYL) IVPB 500 mg  Status:  Discontinued     500 mg 100 mL/hr over 60 Minutes Intravenous Every 8 hours 03/21/15 0245 03/24/15 0856   03/20/15 2030  ciprofloxacin (CIPRO) IVPB 400 mg     400 mg 200 mL/hr over 60 Minutes Intravenous  Once 03/20/15 2022 03/20/15 2219   03/20/15 2030  metroNIDAZOLE (FLAGYL) IVPB 500 mg     500 mg 100 mL/hr over 60 Minutes Intravenous  Once 03/20/15 2022 03/22/15 0809      Subjective:   Veronica [name] seen and examined today.  Patient denies any further nausea or vomiting.  Continues to have loose stools and bloating with the miralax.  Denies chest pain, shortness of breath, dizziness or headache.  Objective:   Filed Vitals:   03/26/15 0558 03/26/15 1350 03/26/15 2219 03/27/15 0444  BP: 136/70 141/67 127/64 117/57  Pulse: 73 77 75 79  Temp: 97.9 F (36.6 C) 98.5 F (36.9 C) 98.4 F (36.9 C) 97.9 F (36.6 C)  TempSrc: Oral Oral Oral Oral  Resp: 18 17 18 18   Height:      Weight:      SpO2: 97% 100% 99% 99%    Wt Readings from Last 3 Encounters:  03/20/15 122.335 kg (269 lb 11.2 oz)  03/20/15 121.11 kg (267 lb)  03/17/15 121.564 kg (268 lb)    No intake or output data in the 24 hours ending 03/27/15 1214  Exam  General: Well developed, well nourished, NAD  HEENT: NCAT,  mucous membranes moist.   Cardiovascular: S1 S2 auscultated, RRR, no murmurs  Respiratory: Clear to auscultation  Abdomen: Soft, obese, nontender, nondistended, +BS.  Extremities: warm dry without cyanosis clubbing or edema  Neuro: AAOx3, nonfocal  Psych: Appropriate mood and affect, pleasant  Data Review   Micro Results Recent Results (from the past 240 hour(s))  Urine culture     Status: None   Collection Time: 03/17/15  4:02 PM  Result Value Ref Range Status   Colony Count 30,000 COLONIES/ML  Final   Organism ID, Bacteria GROUP B STREP (S.AGALACTIAE) ISOLATED   Final    Comment: Testing against S. agalactiae not routinely performed due to predictability of AMP/PEN/VAN susceptibility.     Radiology Reports Dg Abd 1 View  03/25/2015   CLINICAL DATA:  Abdominal pain. Constipation. Recent acute sigmoid diverticulitis.  EXAM: ABDOMEN - 1 VIEW  COMPARISON:  03/20/2015 CT abdomen/ pelvis.  FINDINGS: There are no disproportionately dilated small bowel loops to suggest small bowel obstruction. There is mild-to-moderate stool throughout the colon. No evidence of pneumatosis or pneumoperitoneum. Cholecystectomy clips are seen in the right upper quadrant of  the abdomen. Mild reversed S-shaped long segment thoracolumbar scoliosis with associated degenerative changes.  IMPRESSION: Nonobstructive bowel gas pattern. Mild to moderate colonic stool volume.   Electronically Signed   By: Ilona Sorrel M.D.   On: 03/25/2015 12:12   Ct Abdomen Pelvis W Contrast  03/26/2015   CLINICAL DATA:  Followup diverticulitis.  Abdominal pain.  EXAM: CT ABDOMEN AND PELVIS WITH CONTRAST  TECHNIQUE: Multidetector CT imaging of the abdomen and pelvis was performed using the standard protocol following bolus administration of intravenous contrast.  CONTRAST:  119mL OMNIPAQUE IOHEXOL 300 MG/ML  SOLN  COMPARISON:  03/20/2015  FINDINGS: Lung bases are clear.  Old rib fractures on the right.  No acute liver finding. Previous cholecystectomy. The spleen is normal. The pancreas is normal. The adrenal glands are normal. The kidneys are normal. The aorta and IVC are normal. Ventral hernia again noted. This primarily contains fat. Presently, a small bit of the transverse colon extends into the hernia, not seen previously.  Patient has acute diverticulitis in the sigmoid colon region with surrounding edema. There is a 4.5 cm abscess in the central pelvis which has become slightly more distinct when compared to the study of 6 days ago. 2 x 3 cm low-density in the left pelvic sidewall region is unchanged and  more likely to represent a cyst of the left ovary. No bowel obstruction. No new bowel finding.  IMPRESSION: Continued evidence of diverticulitis of the sigmoid colon. 4.5 cm fluid collection in the central pelvis appears more discrete than on the previous study and probably represents an abscess.   Electronically Signed   By: Nelson Chimes M.D.   On: 03/26/2015 12:55   Ct Abdomen Pelvis W Contrast  03/20/2015   ADDENDUM REPORT: 03/20/2015 16:15 ADDENDUM: These results were called by telephone at the time of interpretation on 03/20/2015 at 4:14 pm to Dr. Thersa Salt , who verbally acknowledged these results. Electronically Signed   By: Jerilynn Mages.  Shick M.D.   On: 03/20/2015 16:15  03/20/2015   CLINICAL DATA:  Acute lower abdominal and pelvic pain, dysuria, rectal pain, elevated white count.  EXAM: CT ABDOMEN AND PELVIS WITH CONTRAST  TECHNIQUE: Multidetector CT imaging of the abdomen and pelvis was performed using the standard protocol following bolus administration of intravenous contrast.  CONTRAST:  171mL OMNIPAQUE IOHEXOL 300 MG/ML  SOLN  COMPARISON:  None.  FINDINGS: Lower chest: Healed right lower rib fractures with deformity. Lung bases remain clear. Normal heart size. No pericardial or pleural effusion. Degenerative changes of the lower thoracic spine. Small hiatal hernia evident.  Abdomen: Prior cholecystectomy. Small midline fat containing umbilical hernia with an abdominal defect measuring 4.5 cm, image 43. This is just above the umbilicus.  Liver, biliary system, pancreas, spleen, adrenal glands, and kidneys are within normal limits for age and demonstrate no acute process.  Negative for bowel obstruction, dilatation, ileus, or significant free air.  Retro aortic left renal vein noted, normal variant.  Intact aorta. Negative for aneurysm. No acute retroperitoneal process.  Pelvis: Diverticulosis present of the colon. Lower sigmoid and rectum demonstrates wall thickening with surrounding edema and inflammation  compatible with diverticulitis. Difficult to exclude a mural lesion. Deep within the pelvis, 2 small fluid collections are present 1 the midline measures 39 x 31 mm posterior to the uterus image 65. Left adnexal small fluid collection versus ovarian cyst measures 30 x 21 mm, image 63. Urinary bladder collapsed. Uterus is tilted to the right.  No adenopathy , inguinal abnormality, or  inguinal hernia.  Degenerative changes of the lower lumbar spine and SI joints.  IMPRESSION: Acute diverticulitis of the lower sigmoid colon and rectum.  Midline pelvic small fluid collection measuring 4 x 3 cm suspicious for associated abscess  Left adnexal small fluid collection versus cystic area along the left ovary could represent a second abscess versus a small ovarian cyst measuring 30 x 21 mm.  No associated obstruction or ileus  Small fat containing ventral hernia superior to the umbilicus.  Prior cholecystectomy  Small hiatal hernia  Electronically Signed: By: Jerilynn Mages.  Shick M.D. On: 03/20/2015 15:55    CBC  Recent Labs Lab 03/23/15 0511 03/24/15 0425 03/25/15 0410 03/26/15 0806 03/27/15 0412  WBC 13.2* 11.0* 10.1 8.7 10.9*  HGB 11.2* 11.2* 10.3* 10.9* 10.4*  HCT 34.0* 34.0* 31.5* 34.2* 33.3*  PLT 280 278 280 301 308  MCV 88.1 88.8 88.2 89.3 90.0  MCH 29.0 29.2 28.9 28.5 28.1  MCHC 32.9 32.9 32.7 31.9 31.2  RDW 12.9 13.0 13.0 13.3 13.5    Chemistries   Recent Labs Lab 03/20/15 1315 03/20/15 1732  03/23/15 0511 03/24/15 0425 03/25/15 0933 03/25/15 1055 03/26/15 0806 03/27/15 0412  NA 136 129*  < > 137 140 135  --  136 139  K 2.9* 2.9*  < > 3.4* 4.1 2.9*  --  3.9 4.4  CL 96* 91*  < > 100* 99* 97*  --  97* 101  CO2 28 27  < > 26 30 31   --  33* 33*  GLUCOSE 94 112*  < > 91 91 98  --  93 88  BUN 7 8  < > 5* <5* <5*  --  <5* <5*  CREATININE 0.83 0.93  < > 0.74 0.82 0.74  --  0.82 0.93  CALCIUM 8.9 8.6*  < > 8.1* 8.4* 8.0*  --  8.1* 8.3*  MG  --   --   --   --   --   --  1.7  --   --   AST 25 23   --   --   --   --   --   --   --   ALT 22 21  --   --   --   --   --   --   --   ALKPHOS 81 82  --   --   --   --   --   --   --   BILITOT 1.6* 1.5*  --   --   --   --   --   --   --   < > = values in this interval not displayed. ------------------------------------------------------------------------------------------------------------------ estimated creatinine clearance is 80.4 mL/min (by C-G formula based on Cr of 0.93). ------------------------------------------------------------------------------------------------------------------ No results for input(s): HGBA1C in the last 72 hours. ------------------------------------------------------------------------------------------------------------------ No results for input(s): CHOL, HDL, LDLCALC, TRIG, CHOLHDL, LDLDIRECT in the last 72 hours. ------------------------------------------------------------------------------------------------------------------ No results for input(s): TSH, T4TOTAL, T3FREE, THYROIDAB in the last 72 hours.  Invalid input(s): FREET3 ------------------------------------------------------------------------------------------------------------------  Recent Labs  03/25/15 1150  VITAMINB12 1074*  FOLATE 12.4  FERRITIN 70  TIBC 244*  IRON 52  RETICCTPCT 1.7    Coagulation profile  Recent Labs Lab 03/21/15 0515  INR 1.32    No results for input(s): DDIMER in the last 72 hours.  Cardiac Enzymes No results for input(s): CKMB, TROPONINI, MYOGLOBIN in the last 168 hours.  Invalid input(s): CK ------------------------------------------------------------------------------------------------------------------ Invalid input(s): POCBNP    Zinia Innocent D.O.  on 03/27/2015 at 12:14 PM  Between 7am to 7pm - Pager - [phone]  After 7pm go to [email or web address] - password [email or web address]  And look for the night coverage person covering for me after hours  [facility] Office  [phone]

## 2015-03-27 NOTE — Progress Notes (Signed)
Patient ID: Veronica [name], female   DOB: 02/07/1951, 64 y.o.   MRN: [id number]     [address] [place], [place] [address]    Phone: [phone] FAX: [phone]     Subjective: Loose stools, bloating with miralax.  Tolerating low fiber diet. No pain.   Objective:  Vital signs:  Filed Vitals:   03/26/15 0558 03/26/15 1350 03/26/15 2219 03/27/15 0444  BP: 136/70 141/67 127/64 117/57  Pulse: 73 77 75 79  Temp: 97.9 F (36.6 C) 98.5 F (36.9 C) 98.4 F (36.9 C) 97.9 F (36.6 C)  TempSrc: Oral Oral Oral Oral  Resp: $Remo'18 17 18 18  'uihjM$ Height:      Weight:      SpO2: 97% 100% 99% 99%    Last BM Date: 03/26/15  Intake/Output   Yesterday:  10/09 0701 - 10/10 0700 In: 240 [P.O.:240] Out: -  This shift:    Physical Exam: General: Pt awake/alert/oriented x4 in no acute distress Abdomen: Soft.  Nondistended.  Non tender.  No evidence of peritonitis.  No incarcerated hernias.    Problem List:   Principal Problem:   Diverticulitis Active Problems:   Abdominal abscess ([place])   Hyponatremia   Essential hypertension   Hypokalemia    Results:   Labs: Results for orders placed or performed during the hospital encounter of 03/20/15 (from the past 48 hour(s))  Magnesium     Status: None   Collection Time: 03/25/15 10:55 AM  Result Value Ref Range   Magnesium 1.7 1.7 - 2.4 mg/dL  Vitamin B12     Status: Abnormal   Collection Time: 03/25/15 11:50 AM  Result Value Ref Range   Vitamin B-12 1074 (H) 180 - 914 pg/mL    Comment: (NOTE) This assay is not validated for testing neonatal or myeloproliferative syndrome specimens for Vitamin B12 levels.   Folate     Status: None   Collection Time: 03/25/15 11:50 AM  Result Value Ref Range   Folate 12.4 >5.9 ng/mL  Iron and TIBC     Status: Abnormal   Collection Time: 03/25/15 11:50 AM  Result Value Ref Range   Iron 52 28 - 170 ug/dL   TIBC 244 (L) 250 -  450 ug/dL   Saturation Ratios 21 10.4 - 31.8 %   UIBC 192 ug/dL  Ferritin     Status: None   Collection Time: 03/25/15 11:50 AM  Result Value Ref Range   Ferritin 70 11 - 307 ng/mL  Reticulocytes     Status: Abnormal   Collection Time: 03/25/15 11:50 AM  Result Value Ref Range   Retic Ct Pct 1.7 0.4 - 3.1 %   RBC. 3.71 (L) 3.87 - 5.11 MIL/uL   Retic Count, Manual 63.1 19.0 - 186.0 K/uL  CBC     Status: Abnormal   Collection Time: 03/26/15  8:06 AM  Result Value Ref Range   WBC 8.7 4.0 - 10.5 K/uL   RBC 3.83 (L) 3.87 - 5.11 MIL/uL   Hemoglobin 10.9 (L) 12.0 - 15.0 g/dL   HCT 34.2 (L) 36.0 - 46.0 %   MCV 89.3 78.0 - 100.0 fL   MCH 28.5 26.0 - 34.0 pg   MCHC 31.9 30.0 - 36.0 g/dL   RDW 13.3 11.5 - 15.5 %   Platelets 301 150 - 400 K/uL  Basic metabolic panel     Status: Abnormal   Collection Time: 03/26/15  8:06 AM  Result Value Ref Range   Sodium 136 135 - 145 mmol/L   Potassium 3.9 3.5 - 5.1 mmol/L   Chloride 97 (L) 101 - 111 mmol/L   CO2 33 (H) 22 - 32 mmol/L   Glucose, Bld 93 65 - 99 mg/dL   BUN <5 (L) 6 - 20 mg/dL   Creatinine, Ser 0.82 0.44 - 1.00 mg/dL   Calcium 8.1 (L) 8.9 - 10.3 mg/dL   GFR calc non Af Amer >60 >60 mL/min   GFR calc Af Amer >60 >60 mL/min    Comment: (NOTE) The eGFR has been calculated using the CKD EPI equation. This calculation has not been validated in all clinical situations. eGFR's persistently <60 mL/min signify possible Chronic Kidney Disease.    Anion gap 6 5 - 15  CBC     Status: Abnormal   Collection Time: 03/27/15  4:12 AM  Result Value Ref Range   WBC 10.9 (H) 4.0 - 10.5 K/uL   RBC 3.70 (L) 3.87 - 5.11 MIL/uL   Hemoglobin 10.4 (L) 12.0 - 15.0 g/dL   HCT 33.3 (L) 36.0 - 46.0 %   MCV 90.0 78.0 - 100.0 fL   MCH 28.1 26.0 - 34.0 pg   MCHC 31.2 30.0 - 36.0 g/dL   RDW 13.5 11.5 - 15.5 %   Platelets 308 150 - 400 K/uL  Basic metabolic panel     Status: Abnormal   Collection Time: 03/27/15  4:12 AM  Result Value Ref Range   Sodium  139 135 - 145 mmol/L   Potassium 4.4 3.5 - 5.1 mmol/L   Chloride 101 101 - 111 mmol/L   CO2 33 (H) 22 - 32 mmol/L   Glucose, Bld 88 65 - 99 mg/dL   BUN <5 (L) 6 - 20 mg/dL   Creatinine, Ser 0.93 0.44 - 1.00 mg/dL   Calcium 8.3 (L) 8.9 - 10.3 mg/dL   GFR calc non Af Amer >60 >60 mL/min   GFR calc Af Amer >60 >60 mL/min    Comment: (NOTE) The eGFR has been calculated using the CKD EPI equation. This calculation has not been validated in all clinical situations. eGFR's persistently <60 mL/min signify possible Chronic Kidney Disease.    Anion gap 5 5 - 15    Imaging / Studies: Dg Abd 1 View  03/25/2015   CLINICAL DATA:  Abdominal pain. Constipation. Recent acute sigmoid diverticulitis.  EXAM: ABDOMEN - 1 VIEW  COMPARISON:  03/20/2015 CT abdomen/ pelvis.  FINDINGS: There are no disproportionately dilated small bowel loops to suggest small bowel obstruction. There is mild-to-moderate stool throughout the colon. No evidence of pneumatosis or pneumoperitoneum. Cholecystectomy clips are seen in the right upper quadrant of the abdomen. Mild reversed S-shaped long segment thoracolumbar scoliosis with associated degenerative changes.  IMPRESSION: Nonobstructive bowel gas pattern. Mild to moderate colonic stool volume.   Electronically Signed   By: Ilona Sorrel M.D.   On: 03/25/2015 12:12   Ct Abdomen Pelvis W Contrast  03/26/2015   CLINICAL DATA:  Followup diverticulitis.  Abdominal pain.  EXAM: CT ABDOMEN AND PELVIS WITH CONTRAST  TECHNIQUE: Multidetector CT imaging of the abdomen and pelvis was performed using the standard protocol following bolus administration of intravenous contrast.  CONTRAST:  149mL OMNIPAQUE IOHEXOL 300 MG/ML  SOLN  COMPARISON:  03/20/2015  FINDINGS: Lung bases are clear.  Old rib fractures on the right.  No acute liver finding. Previous cholecystectomy. The spleen is normal. The pancreas is normal. The adrenal  glands are normal. The kidneys are normal. The aorta and IVC are  normal. Ventral hernia again noted. This primarily contains fat. Presently, a small bit of the transverse colon extends into the hernia, not seen previously.  Patient has acute diverticulitis in the sigmoid colon region with surrounding edema. There is a 4.5 cm abscess in the central pelvis which has become slightly more distinct when compared to the study of 6 days ago. 2 x 3 cm low-density in the left pelvic sidewall region is unchanged and more likely to represent a cyst of the left ovary. No bowel obstruction. No new bowel finding.  IMPRESSION: Continued evidence of diverticulitis of the sigmoid colon. 4.5 cm fluid collection in the central pelvis appears more discrete than on the previous study and probably represents an abscess.   Electronically Signed   By: Nelson Chimes M.D.   On: 03/26/2015 12:55    Medications / Allergies:  Scheduled Meds: . aspirin EC  81 mg Oral Daily  . ciprofloxacin  500 mg Oral BID  . famotidine  20 mg Oral BID  . heparin  5,000 Units Subcutaneous 3 times per day  . hydrochlorothiazide  12.5 mg Oral Daily  .  HYDROmorphone (DILAUDID) injection  1 mg Intravenous Once  . irbesartan  150 mg Oral Daily  . metroNIDAZOLE  500 mg Oral Q8H  . ondansetron  4 mg Oral TID  . polyethylene glycol  17 g Oral Daily  . potassium chloride  40 mEq Oral BID  . saccharomyces boulardii  250 mg Oral BID  . sodium chloride  3 mL Intravenous Q12H  . sodium chloride  3 mL Intravenous Q12H   Continuous Infusions:  PRN Meds:.acetaminophen, alum & mag hydroxide-simeth, hydrALAZINE, HYDROcodone-acetaminophen, morphine injection, ondansetron (ZOFRAN) IV, sodium chloride  Antibiotics: Anti-infectives    Start     Dose/Rate Route Frequency Ordered Stop   03/24/15 0930  ciprofloxacin (CIPRO) tablet 500 mg     500 mg Oral 2 times daily 03/24/15 0856     03/24/15 0930  metroNIDAZOLE (FLAGYL) tablet 500 mg     500 mg Oral Every 8 hours 03/24/15 0856     03/21/15 1000  ciprofloxacin (CIPRO)  IVPB 400 mg  Status:  Discontinued     400 mg 200 mL/hr over 60 Minutes Intravenous Every 12 hours 03/21/15 0245 03/21/15 0714   03/21/15 0730  cefTRIAXone (ROCEPHIN) 2 g in dextrose 5 % 50 mL IVPB  Status:  Discontinued    Comments:  Pharmacy may adjust dosing strength / duration / interval for maximal efficacy   2 g 100 mL/hr over 30 Minutes Intravenous Every 24 hours 03/21/15 0715 03/24/15 0856   03/21/15 0600  metroNIDAZOLE (FLAGYL) IVPB 500 mg  Status:  Discontinued     500 mg 100 mL/hr over 60 Minutes Intravenous Every 8 hours 03/21/15 0245 03/24/15 0856   03/20/15 2030  ciprofloxacin (CIPRO) IVPB 400 mg     400 mg 200 mL/hr over 60 Minutes Intravenous  Once 03/20/15 2022 03/20/15 2219   03/20/15 2030  metroNIDAZOLE (FLAGYL) IVPB 500 mg     500 mg 100 mL/hr over 60 Minutes Intravenous  Once 03/20/15 2022 03/22/15 0809         Assessment/Plan HD#7 Acute sigmoid diverticulitis with abscess-CT shows a persistent 4.5cm well defined fluid collection.  Non tender on exam, however, WBC has increased today.  Will discuss with Dr. Donne Hazel whether to DC home versus asking IR to re-evaluate for aspiration/drain placement.  -DC miralax.  ID-cipro/flagyl for a total 14 day course.  FEN-soft diet VTE prophylaxis -SCD/heparin Dispo-home today v IR eval   Erby Pian, ANP-BC [facility] Pager [phone](7A-4:30P) For consults and floor pages call [phone](7A-4:30P)  03/27/2015 10:06 AM

## 2015-03-27 NOTE — Consult Note (Signed)
Chief Complaint: Patient was seen in consultation today for diverticular abscess Chief Complaint  Patient presents with  . Abdominal Pain   at the request of Dr Donne Hazel  Referring Physician(s): Maceo  History of Present Illness: Veronica [name] is a 64 y.o. female   Acute diverticulosis Abscess noted on 10/3 noted suspicious finding of collection but deemed no good/safe window for access Pt now with rise in wbc but clinically better CT 10/9: IMPRESSION: Continued evidence of diverticulitis of the sigmoid colon. 4.5 cm fluid collection in the central pelvis appears more discrete than on the previous study and probably represents an abscess.  Request to re review imaging for possible aspiration/drain placement per [facility] Dr Barbie Banner has reviewed new imaging and approves CT guided abscess aspiration with only possible drain placement  Past Medical History  Diagnosis Date  . Broken ribs     hx of  . Hemorrhoids   . Allergy   . Arthritis   . GERD (gastroesophageal reflux disease)   . Hypertension   . Carpal tunnel syndrome 12/27/2010  . Diverticulitis     Past Surgical History  Procedure Laterality Date  . Cholecystectomy  2000  . Shoulder surgery Right 1999    Allergies: Clarithromycin  Medications: Prior to Admission medications   Medication Sig Start Date End Date Taking? Authorizing Provider  acetaminophen (TYLENOL) 650 MG CR tablet Take 650 mg by mouth every 8 (eight) hours as needed for pain.   Yes Historical Provider, MD  aspirin 81 MG EC tablet Take 1 tablet (81 mg total) by mouth daily. Swallow whole. Patient taking differently: Take 81 mg by mouth every 14 (fourteen) days. Swallow whole. 11/27/12  Yes Biagio Borg, MD  cephALEXin (KEFLEX) 500 MG capsule Take 1 capsule (500 mg total) by mouth 4 (four) times daily. 03/17/15  Yes Biagio Borg, MD  irbesartan-hydrochlorothiazide (AVALIDE) 150-12.5 MG per tablet TAKE 2 TABLETS BY MOUTH ONCE DAILY 11/30/13   Yes Biagio Borg, MD  traMADol Veatrice Bourbon) 50 MG tablet take 1 tablet by mouth every 6 hours if needed for pain 12/23/14  Yes Biagio Borg, MD     Family History  Problem Relation Age of Onset  . Arthritis Other   . Alcohol abuse Other   . Heart disease Other   . Hypertension Other   . Cancer Other   . Heart disease Other     Social History   Social History  . Marital Status: Married    Spouse Name: N/A  . Number of Children: N/A  . Years of Education: 16   Occupational History  . Aministrative support [employer]   Social History Main Topics  . Smoking status: Never Smoker   . Smokeless tobacco: Never Used  . Alcohol Use: No  . Drug Use: No  . Sexual Activity: Not Asked   Other Topics Concern  . None   Social History Narrative    Review of Systems: A 12 point ROS discussed and pertinent positives are indicated in the HPI above.  All other systems are negative.  Review of Systems  Constitutional: Positive for activity change. Negative for fever.  Respiratory: Negative for shortness of breath.   Gastrointestinal: Positive for abdominal pain.  Neurological: Negative for weakness.  Psychiatric/Behavioral: Negative for behavioral problems and confusion.    Vital Signs: BP 117/57 mmHg  Pulse 79  Temp(Src) 97.9 F (36.6 C) (Oral)  Resp 18  Ht 5\' 6"  (1.676 m)  Wt 269 lb 11.2 oz (122.335 kg)  BMI 43.55 kg/m2  SpO2 99%  Physical Exam  Constitutional: She is oriented to person, place, and time.  Cardiovascular: Normal rate and regular rhythm.   Pulmonary/Chest: Effort normal and breath sounds normal.  Abdominal: Soft. Bowel sounds are normal. There is tenderness.  Musculoskeletal: Normal range of motion.  Neurological: She is alert and oriented to person, place, and time.  Skin: Skin is warm and dry.  Psychiatric: She has a normal mood and affect. Her behavior is normal. Judgment and thought content normal.  Nursing note and vitals reviewed.   Mallampati  Score:  MD Evaluation Airway: WNL Heart: WNL Abdomen: WNL Chest/ Lungs: WNL ASA  Classification: 2 Mallampati/Airway Score: One  Imaging: Dg Abd 1 View  03/25/2015   CLINICAL DATA:  Abdominal pain. Constipation. Recent acute sigmoid diverticulitis.  EXAM: ABDOMEN - 1 VIEW  COMPARISON:  03/20/2015 CT abdomen/ pelvis.  FINDINGS: There are no disproportionately dilated small bowel loops to suggest small bowel obstruction. There is mild-to-moderate stool throughout the colon. No evidence of pneumatosis or pneumoperitoneum. Cholecystectomy clips are seen in the right upper quadrant of the abdomen. Mild reversed S-shaped long segment thoracolumbar scoliosis with associated degenerative changes.  IMPRESSION: Nonobstructive bowel gas pattern. Mild to moderate colonic stool volume.   Electronically Signed   By: Ilona Sorrel M.D.   On: 03/25/2015 12:12   Ct Abdomen Pelvis W Contrast  03/26/2015   CLINICAL DATA:  Followup diverticulitis.  Abdominal pain.  EXAM: CT ABDOMEN AND PELVIS WITH CONTRAST  TECHNIQUE: Multidetector CT imaging of the abdomen and pelvis was performed using the standard protocol following bolus administration of intravenous contrast.  CONTRAST:  141mL OMNIPAQUE IOHEXOL 300 MG/ML  SOLN  COMPARISON:  03/20/2015  FINDINGS: Lung bases are clear.  Old rib fractures on the right.  No acute liver finding. Previous cholecystectomy. The spleen is normal. The pancreas is normal. The adrenal glands are normal. The kidneys are normal. The aorta and IVC are normal. Ventral hernia again noted. This primarily contains fat. Presently, a small bit of the transverse colon extends into the hernia, not seen previously.  Patient has acute diverticulitis in the sigmoid colon region with surrounding edema. There is a 4.5 cm abscess in the central pelvis which has become slightly more distinct when compared to the study of 6 days ago. 2 x 3 cm low-density in the left pelvic sidewall region is unchanged and more  likely to represent a cyst of the left ovary. No bowel obstruction. No new bowel finding.  IMPRESSION: Continued evidence of diverticulitis of the sigmoid colon. 4.5 cm fluid collection in the central pelvis appears more discrete than on the previous study and probably represents an abscess.   Electronically Signed   By: Nelson Chimes M.D.   On: 03/26/2015 12:55   Ct Abdomen Pelvis W Contrast  03/20/2015   ADDENDUM REPORT: 03/20/2015 16:15 ADDENDUM: These results were called by telephone at the time of interpretation on 03/20/2015 at 4:14 pm to Dr. Thersa Salt , who verbally acknowledged these results. Electronically Signed   By: Jerilynn Mages.  Shick M.D.   On: 03/20/2015 16:15  03/20/2015   CLINICAL DATA:  Acute lower abdominal and pelvic pain, dysuria, rectal pain, elevated white count.  EXAM: CT ABDOMEN AND PELVIS WITH CONTRAST  TECHNIQUE: Multidetector CT imaging of the abdomen and pelvis was performed using the standard protocol following bolus administration of intravenous contrast.  CONTRAST:  169mL OMNIPAQUE IOHEXOL 300 MG/ML  SOLN  COMPARISON:  None.  FINDINGS: Lower chest: Healed right lower rib fractures with deformity. Lung bases remain clear. Normal heart size. No pericardial or pleural effusion. Degenerative changes of the lower thoracic spine. Small hiatal hernia evident.  Abdomen: Prior cholecystectomy. Small midline fat containing umbilical hernia with an abdominal defect measuring 4.5 cm, image 43. This is just above the umbilicus.  Liver, biliary system, pancreas, spleen, adrenal glands, and kidneys are within normal limits for age and demonstrate no acute process.  Negative for bowel obstruction, dilatation, ileus, or significant free air.  Retro aortic left renal vein noted, normal variant.  Intact aorta. Negative for aneurysm. No acute retroperitoneal process.  Pelvis: Diverticulosis present of the colon. Lower sigmoid and rectum demonstrates wall thickening with surrounding edema and inflammation  compatible with diverticulitis. Difficult to exclude a mural lesion. Deep within the pelvis, 2 small fluid collections are present 1 the midline measures 39 x 31 mm posterior to the uterus image 65. Left adnexal small fluid collection versus ovarian cyst measures 30 x 21 mm, image 63. Urinary bladder collapsed. Uterus is tilted to the right.  No adenopathy , inguinal abnormality, or inguinal hernia.  Degenerative changes of the lower lumbar spine and SI joints.  IMPRESSION: Acute diverticulitis of the lower sigmoid colon and rectum.  Midline pelvic small fluid collection measuring 4 x 3 cm suspicious for associated abscess  Left adnexal small fluid collection versus cystic area along the left ovary could represent a second abscess versus a small ovarian cyst measuring 30 x 21 mm.  No associated obstruction or ileus  Small fat containing ventral hernia superior to the umbilicus.  Prior cholecystectomy  Small hiatal hernia  Electronically Signed: By: Jerilynn Mages.  Shick M.D. On: 03/20/2015 15:55    Labs:  CBC:  Recent Labs  03/24/15 0425 03/25/15 0410 03/26/15 0806 03/27/15 0412  WBC 11.0* 10.1 8.7 10.9*  HGB 11.2* 10.3* 10.9* 10.4*  HCT 34.0* 31.5* 34.2* 33.3*  PLT 278 280 301 308    COAGS:  Recent Labs  03/21/15 0515  INR 1.32  APTT 29    BMP:  Recent Labs  03/24/15 0425 03/25/15 0933 03/26/15 0806 03/27/15 0412  NA 140 135 136 139  K 4.1 2.9* 3.9 4.4  CL 99* 97* 97* 101  CO2 30 31 33* 33*  GLUCOSE 91 98 93 88  BUN <5* <5* <5* <5*  CALCIUM 8.4* 8.0* 8.1* 8.3*  CREATININE 0.82 0.74 0.82 0.93  GFRNONAA >60 >60 >60 >60  GFRAA >60 >60 >60 >60    LIVER FUNCTION TESTS:  Recent Labs  12/13/14 0929 03/20/15 1315 03/20/15 1732  BILITOT 1.3* 1.6* 1.5*  AST 15 25 23   ALT 13 22 21   ALKPHOS 80 81 82  PROT 7.6 8.1 7.7  ALBUMIN 3.6 3.1* 3.0*    TUMOR MARKERS: No results for input(s): AFPTM, CEA, CA199, CHROMGRNA in the last 8760 hours.  Assessment and Plan:  Diverticular  abscess Scheduled for abscess aspiration; possible drain placement in IR 10/11 Will keep npo and Hold Hep inj for 10/11 Risks and Benefits discussed with the patient including bleeding, infection, damage to adjacent structures, bowel perforation/fistula connection, and sepsis. All of the patient's questions were answered, patient is agreeable to proceed. Consent signed and in chart.   Thank you for this interesting consult.  I greatly enjoyed meeting Veronica [name] and look forward to participating in their care.  A copy of this report was sent to the requesting provider on this date.  Signed:  Akelia Husted A 03/27/2015, 11:47 AM   I spent a total of 40 Minutes    in face to face in clinical consultation, greater than 50% of which was counseling/coordinating care for diverticular abscess asp/drain

## 2015-03-28 ENCOUNTER — Inpatient Hospital Stay (HOSPITAL_COMMUNITY): Payer: BC Managed Care – PPO

## 2015-03-28 LAB — CBC
HCT: 33.7 % — ABNORMAL LOW (ref 36.0–46.0)
Hemoglobin: 10.8 g/dL — ABNORMAL LOW (ref 12.0–15.0)
MCH: 29 pg (ref 26.0–34.0)
MCHC: 32 g/dL (ref 30.0–36.0)
MCV: 90.6 fL (ref 78.0–100.0)
Platelets: 317 10*3/uL (ref 150–400)
RBC: 3.72 MIL/uL — ABNORMAL LOW (ref 3.87–5.11)
RDW: 13.9 % (ref 11.5–15.5)
WBC: 11.5 10*3/uL — ABNORMAL HIGH (ref 4.0–10.5)

## 2015-03-28 LAB — BASIC METABOLIC PANEL
Anion gap: 6 (ref 5–15)
BUN: <5 mg/dL — ABNORMAL LOW (ref 6–20)
CO2: 31 mmol/L (ref 22–32)
Calcium: 8.6 mg/dL — ABNORMAL LOW (ref 8.9–10.3)
Chloride: 102 mmol/L (ref 101–111)
Creatinine, Ser: 0.93 mg/dL (ref 0.44–1.00)
GFR calc Af Amer: >60 mL/min (ref >60)
GFR calc non Af Amer: >60 mL/min (ref >60)
Glucose, Bld: 85 mg/dL (ref 65–99)
Potassium: 4.5 mmol/L (ref 3.5–5.1)
Sodium: 139 mmol/L (ref 135–145)

## 2015-03-28 LAB — PROTIME-INR
INR: 1.23 (ref 0.00–1.49)
Prothrombin Time: 15.7 seconds — ABNORMAL HIGH (ref 11.6–15.2)

## 2015-03-28 LAB — APTT: aPTT: 31 seconds (ref 24–37)

## 2015-03-28 MED ORDER — FENTANYL CITRATE (PF) 100 MCG/2ML IJ SOLN
INTRAMUSCULAR | Status: AC | PRN
  Administered 2015-03-28 (×2): 50 ug via INTRAVENOUS

## 2015-03-28 MED ORDER — FENTANYL CITRATE (PF) 100 MCG/2ML IJ SOLN
INTRAMUSCULAR | Status: AC
  Filled 2015-03-28: qty 4

## 2015-03-28 MED ORDER — LIDOCAINE HCL 1 % IJ SOLN
INTRAMUSCULAR | Status: AC
  Filled 2015-03-28: qty 20

## 2015-03-28 MED ORDER — MIDAZOLAM HCL 2 MG/2ML IJ SOLN
INTRAMUSCULAR | Status: AC | PRN
  Administered 2015-03-28 (×2): 1 mg via INTRAVENOUS

## 2015-03-28 MED ORDER — MIDAZOLAM HCL 2 MG/2ML IJ SOLN
INTRAMUSCULAR | Status: AC
Start: 2015-03-28 — End: 2015-03-29
  Filled 2015-03-28: qty 4

## 2015-03-28 MED ORDER — SODIUM CHLORIDE 0.9 % IV SOLN
INTRAVENOUS | Status: AC | PRN
  Administered 2015-03-28: 10 mL/h via INTRAVENOUS

## 2015-03-28 NOTE — Progress Notes (Signed)
At 18:00 o'clock - JP drain - output 50cc; flushed with NS 10 cc per MD order. Will continue to monitor.

## 2015-03-28 NOTE — Procedures (Signed)
Interventional Radiology Procedure Note  Procedure:  CT guided percutaneous drainage of diverticular abscess  Complications:  None  Estimated Blood Loss: < 10 mL  Grossly purulent fluid aspirated from pelvic diverticular abscess. 12 Fr drain placed and attached to suction bulb drainage.  Venetia Night. Kathlene Cote, M.D Pager:  [phone]

## 2015-03-28 NOTE — Sedation Documentation (Signed)
Patient is resting comfortably. 

## 2015-03-28 NOTE — Progress Notes (Signed)
[facility]                                                                              Patient Demographics  Veronica [name], is a 64 y.o. female, DOB - 1950-08-08, [place] date - 03/20/2015   Admitting Physician Waldemar Dickens, MD  Outpatient Primary MD for the patient is Cathlean Cower, MD  LOS - 8   Chief Complaint  Patient presents with  . Abdominal Pain      HPI on 03/20/2015 by Dr. Linna Darner  Veronica [name] is a 64 y.o. female  Abdominal pain. Started 1 week ago. Getting worse. Comes and goes. Left lower quadrant initially but now more generalized. Associated with watery diarrhea. Denies bloody bowel movements, vomiting, fevers, dysuria, frequency. Pain is worse with meals. Some improvement after trying Dulcolax couple of days ago.  Interim history Patient admitted for acute sigmoid diverticulitis with abscess. Initially abscess was not amenable to drainage. Started on antibiotics Cipro and Flagyl currently on oral form. Repeat CT showed persistent 4.5 cm fluid collection.  IR consulted to assess if abscess would be amendable to aspiration and drain placement today. Possibly discharge 03/29/2015  Assessment & Plan   Acute sigmoid diverticulitis with abscess -CT showing diverticulitis of the lower sigmoid and rectum with 4x3 cm fluid collection in the midline pelvic region suspicious for abscess.  -General surgery consulted and appreciated; Recommend IV antibiotics and possible IR drainage -Abscess not amendable to drainage  -Continue Cipro/Flagyl- transitioned to orals yesterday, having nausea -Continue conservative management, antibiotics, pain control -Will order zofran prior to antibiotics and see if this alleviates or improves nausea -KUB: nonobstructive bowel gas pattern, mild-moderate colonic stool voume -Continue miralax -Per surgery:Repeat CT abd: persistent 4.5cm fluid collection-likely abscess  -General surgery consulted IR for possible  aspiration and drain placement on 10/11- pending   Hypertension -Patient has reasonable blood pressure controlled -Continue avapro, HCTZ  Hyponatremia -Resolved, Sodium was 129 upon admission, currently 139 -Continue to monitor BMP   Hypokalemia -Resolved, Likely secondary to diuretics vs GI losses -Magnesium 1.7, replaced -Continue to monitor BMP and replace as needed  Normocytic Anemia -Drop in Hb possibly due to dilutional component. -Anemia panel: Iron 52, Ferritin 70 -Hb 10.8, stable,continue to monitor  Code Status: Full  Family Communication: None at bedside  Disposition Plan: Admitted. Pending aspiration and drain placement today.    Time Spent in minutes   30 minutes  Procedures  None  Consults   General surgery Interventional radiology  DVT Prophylaxis  heparin  Lab Results  Component Value Date   PLT 317 03/28/2015    Medications  Scheduled Meds: . aspirin EC  81 mg Oral Daily  . ciprofloxacin  500 mg Oral BID  . famotidine  20 mg Oral BID  . heparin  5,000 Units Subcutaneous 3 times per day  . hydrochlorothiazide  12.5 mg Oral Daily  .  HYDROmorphone (DILAUDID) injection  1 mg Intravenous Once  . irbesartan  150 mg Oral Daily  . metroNIDAZOLE  500 mg Oral Q8H  . ondansetron  4 mg Oral TID  . potassium chloride  40 mEq Oral BID  . saccharomyces boulardii  250 mg Oral BID  . sodium chloride  3 mL Intravenous Q12H  . sodium chloride  3 mL Intravenous Q12H   Continuous Infusions:   PRN Meds:.acetaminophen, alum & mag hydroxide-simeth, hydrALAZINE, HYDROcodone-acetaminophen, morphine injection, ondansetron (ZOFRAN) IV, sodium chloride  Antibiotics    Anti-infectives    Start     Dose/Rate Route Frequency Ordered Stop   03/24/15 0930  ciprofloxacin (CIPRO) tablet 500 mg     500 mg Oral 2 times daily 03/24/15 0856     03/24/15 0930  metroNIDAZOLE (FLAGYL) tablet 500 mg     500 mg Oral Every 8 hours 03/24/15 0856     03/21/15 1000   ciprofloxacin (CIPRO) IVPB 400 mg  Status:  Discontinued     400 mg 200 mL/hr over 60 Minutes Intravenous Every 12 hours 03/21/15 0245 03/21/15 0714   03/21/15 0730  cefTRIAXone (ROCEPHIN) 2 g in dextrose 5 % 50 mL IVPB  Status:  Discontinued    Comments:  Pharmacy may adjust dosing strength / duration / interval for maximal efficacy   2 g 100 mL/hr over 30 Minutes Intravenous Every 24 hours 03/21/15 0715 03/24/15 0856   03/21/15 0600  metroNIDAZOLE (FLAGYL) IVPB 500 mg  Status:  Discontinued     500 mg 100 mL/hr over 60 Minutes Intravenous Every 8 hours 03/21/15 0245 03/24/15 0856   03/20/15 2030  ciprofloxacin (CIPRO) IVPB 400 mg     400 mg 200 mL/hr over 60 Minutes Intravenous  Once 03/20/15 2022 03/20/15 2219   03/20/15 2030  metroNIDAZOLE (FLAGYL) IVPB 500 mg     500 mg 100 mL/hr over 60 Minutes Intravenous  Once 03/20/15 2022 03/22/15 0809      Subjective:   [name] seen and examined today. Patient denies any nausea, abdominal pain at this time. Does state that she still has loose stools. Denies chest pain, shortness of breath, dizziness, headache.  Objective:   Filed Vitals:   03/27/15 0444 03/27/15 1330 03/27/15 2126 03/28/15 0552  BP: 117/57 134/74 119/69 114/53  Pulse: 79 76 69 68  Temp: 97.9 F (36.6 C) 97.8 F (36.6 C) 98.8 F (37.1 C) 97.9 F (36.6 C)  TempSrc: Oral Axillary Oral Oral  Resp: 18 18 18 18   Height:      Weight:      SpO2: 99% 98% 100% 96%    Wt Readings from Last 3 Encounters:  03/20/15 122.335 kg (269 lb 11.2 oz)  03/20/15 121.11 kg (267 lb)  03/17/15 121.564 kg (268 lb)     Intake/Output Summary (Last 24 hours) at 03/28/15 1232 Last data filed at 03/28/15 1229  Gross per 24 hour  Intake    260 ml  Output      0 ml  Net    260 ml    Exam  General: Well developed, well nourished, NAD  HEENT: NCAT,  mucous membranes moist.   Cardiovascular: S1 S2 auscultated, RRR, no murmurs  Respiratory: Clear to auscultation  bilaterally  Abdomen: Soft, obese, nontender, nondistended, +BS.  Extremities: warm dry without cyanosis clubbing or edema  Neuro: AAOx3, nonfocal  Psych: Appropriate mood and affect, pleasant  Data Review   Micro Results No results found for this or any previous visit (from the past 240 hour(s)).  Radiology Reports Dg Abd 1 View  03/25/2015   CLINICAL DATA:  Abdominal pain. Constipation. Recent acute sigmoid diverticulitis.  EXAM: ABDOMEN - 1 VIEW  COMPARISON:  03/20/2015 CT abdomen/ pelvis.  FINDINGS: There are no disproportionately dilated  small bowel loops to suggest small bowel obstruction. There is mild-to-moderate stool throughout the colon. No evidence of pneumatosis or pneumoperitoneum. Cholecystectomy clips are seen in the right upper quadrant of the abdomen. Mild reversed S-shaped long segment thoracolumbar scoliosis with associated degenerative changes.  IMPRESSION: Nonobstructive bowel gas pattern. Mild to moderate colonic stool volume.   Electronically Signed   By: Ilona Sorrel M.D.   On: 03/25/2015 12:12   Ct Abdomen Pelvis W Contrast  03/26/2015   CLINICAL DATA:  Followup diverticulitis.  Abdominal pain.  EXAM: CT ABDOMEN AND PELVIS WITH CONTRAST  TECHNIQUE: Multidetector CT imaging of the abdomen and pelvis was performed using the standard protocol following bolus administration of intravenous contrast.  CONTRAST:  135mL OMNIPAQUE IOHEXOL 300 MG/ML  SOLN  COMPARISON:  03/20/2015  FINDINGS: Lung bases are clear.  Old rib fractures on the right.  No acute liver finding. Previous cholecystectomy. The spleen is normal. The pancreas is normal. The adrenal glands are normal. The kidneys are normal. The aorta and IVC are normal. Ventral hernia again noted. This primarily contains fat. Presently, a small bit of the transverse colon extends into the hernia, not seen previously.  Patient has acute diverticulitis in the sigmoid colon region with surrounding edema. There is a 4.5 cm abscess  in the central pelvis which has become slightly more distinct when compared to the study of 6 days ago. 2 x 3 cm low-density in the left pelvic sidewall region is unchanged and more likely to represent a cyst of the left ovary. No bowel obstruction. No new bowel finding.  IMPRESSION: Continued evidence of diverticulitis of the sigmoid colon. 4.5 cm fluid collection in the central pelvis appears more discrete than on the previous study and probably represents an abscess.   Electronically Signed   By: Nelson Chimes M.D.   On: 03/26/2015 12:55   Ct Abdomen Pelvis W Contrast  03/20/2015   ADDENDUM REPORT: 03/20/2015 16:15 ADDENDUM: These results were called by telephone at the time of interpretation on 03/20/2015 at 4:14 pm to Dr. Thersa Salt , who verbally acknowledged these results. Electronically Signed   By: Jerilynn Mages.  Shick M.D.   On: 03/20/2015 16:15  03/20/2015   CLINICAL DATA:  Acute lower abdominal and pelvic pain, dysuria, rectal pain, elevated white count.  EXAM: CT ABDOMEN AND PELVIS WITH CONTRAST  TECHNIQUE: Multidetector CT imaging of the abdomen and pelvis was performed using the standard protocol following bolus administration of intravenous contrast.  CONTRAST:  12mL OMNIPAQUE IOHEXOL 300 MG/ML  SOLN  COMPARISON:  None.  FINDINGS: Lower chest: Healed right lower rib fractures with deformity. Lung bases remain clear. Normal heart size. No pericardial or pleural effusion. Degenerative changes of the lower thoracic spine. Small hiatal hernia evident.  Abdomen: Prior cholecystectomy. Small midline fat containing umbilical hernia with an abdominal defect measuring 4.5 cm, image 43. This is just above the umbilicus.  Liver, biliary system, pancreas, spleen, adrenal glands, and kidneys are within normal limits for age and demonstrate no acute process.  Negative for bowel obstruction, dilatation, ileus, or significant free air.  Retro aortic left renal vein noted, normal variant.  Intact aorta. Negative for  aneurysm. No acute retroperitoneal process.  Pelvis: Diverticulosis present of the colon. Lower sigmoid and rectum demonstrates wall thickening with surrounding edema and inflammation compatible with diverticulitis. Difficult to exclude a mural lesion. Deep within the pelvis, 2 small fluid collections are present 1 the midline measures 39 x 31 mm posterior to the uterus image 65.  Left adnexal small fluid collection versus ovarian cyst measures 30 x 21 mm, image 63. Urinary bladder collapsed. Uterus is tilted to the right.  No adenopathy , inguinal abnormality, or inguinal hernia.  Degenerative changes of the lower lumbar spine and SI joints.  IMPRESSION: Acute diverticulitis of the lower sigmoid colon and rectum.  Midline pelvic small fluid collection measuring 4 x 3 cm suspicious for associated abscess  Left adnexal small fluid collection versus cystic area along the left ovary could represent a second abscess versus a small ovarian cyst measuring 30 x 21 mm.  No associated obstruction or ileus  Small fat containing ventral hernia superior to the umbilicus.  Prior cholecystectomy  Small hiatal hernia  Electronically Signed: By: Jerilynn Mages.  Shick M.D. On: 03/20/2015 15:55    CBC  Recent Labs Lab 03/24/15 0425 03/25/15 0410 03/26/15 0806 03/27/15 0412 03/28/15 0423  WBC 11.0* 10.1 8.7 10.9* 11.5*  HGB 11.2* 10.3* 10.9* 10.4* 10.8*  HCT 34.0* 31.5* 34.2* 33.3* 33.7*  PLT 278 280 301 308 317  MCV 88.8 88.2 89.3 90.0 90.6  MCH 29.2 28.9 28.5 28.1 29.0  MCHC 32.9 32.7 31.9 31.2 32.0  RDW 13.0 13.0 13.3 13.5 13.9    Chemistries   Recent Labs Lab 03/24/15 0425 03/25/15 0933 03/25/15 1055 03/26/15 0806 03/27/15 0412 03/28/15 0423  NA 140 135  --  136 139 139  K 4.1 2.9*  --  3.9 4.4 4.5  CL 99* 97*  --  97* 101 102  CO2 30 31  --  33* 33* 31  GLUCOSE 91 98  --  93 88 85  BUN <5* <5*  --  <5* <5* <5*  CREATININE 0.82 0.74  --  0.82 0.93 0.93  CALCIUM 8.4* 8.0*  --  8.1* 8.3* 8.6*  MG  --   --   1.7  --   --   --    ------------------------------------------------------------------------------------------------------------------ estimated creatinine clearance is 80.4 mL/min (by C-G formula based on Cr of 0.93). ------------------------------------------------------------------------------------------------------------------ No results for input(s): HGBA1C in the last 72 hours. ------------------------------------------------------------------------------------------------------------------ No results for input(s): CHOL, HDL, LDLCALC, TRIG, CHOLHDL, LDLDIRECT in the last 72 hours. ------------------------------------------------------------------------------------------------------------------ No results for input(s): TSH, T4TOTAL, T3FREE, THYROIDAB in the last 72 hours.  Invalid input(s): FREET3 ------------------------------------------------------------------------------------------------------------------ No results for input(s): VITAMINB12, FOLATE, FERRITIN, TIBC, IRON, RETICCTPCT in the last 72 hours.  Coagulation profile  Recent Labs Lab 03/28/15 0423  INR 1.23    No results for input(s): DDIMER in the last 72 hours.  Cardiac Enzymes No results for input(s): CKMB, TROPONINI, MYOGLOBIN in the last 168 hours.  Invalid input(s): CK ------------------------------------------------------------------------------------------------------------------ Invalid input(s): POCBNP    Yiselle Babich D.O. on 03/28/2015 at 12:32 PM  Between 7am to 7pm - Pager - [phone]  After 7pm go to [email or web address] - password [email or web address]  And look for the night coverage person covering for me after hours  [facility] Office  [phone]

## 2015-03-28 NOTE — Progress Notes (Signed)
Subjective: Feels fine no complaints   Objective: Vital signs in last 24 hours: Temp:  [97.8 F (36.6 C)-98.8 F (37.1 C)] 97.9 F (36.6 C) (10/11 0552) Pulse Rate:  [68-76] 68 (10/11 0552) Resp:  [18] 18 (10/11 0552) BP: (114-134)/(53-74) 114/53 mmHg (10/11 0552) SpO2:  [96 %-100 %] 96 % (10/11 0552) Last BM Date: 03/26/15  Intake/Output from previous day:   Intake/Output this shift: Total I/O In: 60 [P.O.:60] Out: -   GI: nontender soft   Lab Results:   Recent Labs  03/27/15 0412 03/28/15 0423  WBC 10.9* 11.5*  HGB 10.4* 10.8*  HCT 33.3* 33.7*  PLT 308 317   BMET  Recent Labs  03/27/15 0412 03/28/15 0423  NA 139 139  K 4.4 4.5  CL 101 102  CO2 33* 31  GLUCOSE 88 85  BUN <5* <5*  CREATININE 0.93 0.93  CALCIUM 8.3* 8.6*   PT/INR  Recent Labs  03/28/15 0423  LABPROT 15.7*  INR 1.23   ABG No results for input(s): PHART, HCO3 in the last 72 hours.  Invalid input(s): PCO2, PO2  Studies/Results: Ct Abdomen Pelvis W Contrast  03/26/2015   CLINICAL DATA:  Followup diverticulitis.  Abdominal pain.  EXAM: CT ABDOMEN AND PELVIS WITH CONTRAST  TECHNIQUE: Multidetector CT imaging of the abdomen and pelvis was performed using the standard protocol following bolus administration of intravenous contrast.  CONTRAST:  125mL OMNIPAQUE IOHEXOL 300 MG/ML  SOLN  COMPARISON:  03/20/2015  FINDINGS: Lung bases are clear.  Old rib fractures on the right.  No acute liver finding. Previous cholecystectomy. The spleen is normal. The pancreas is normal. The adrenal glands are normal. The kidneys are normal. The aorta and IVC are normal. Ventral hernia again noted. This primarily contains fat. Presently, a small bit of the transverse colon extends into the hernia, not seen previously.  Patient has acute diverticulitis in the sigmoid colon region with surrounding edema. There is a 4.5 cm abscess in the central pelvis which has become slightly more distinct when compared to the  study of 6 days ago. 2 x 3 cm low-density in the left pelvic sidewall region is unchanged and more likely to represent a cyst of the left ovary. No bowel obstruction. No new bowel finding.  IMPRESSION: Continued evidence of diverticulitis of the sigmoid colon. 4.5 cm fluid collection in the central pelvis appears more discrete than on the previous study and probably represents an abscess.   Electronically Signed   By: Nelson Chimes M.D.   On: 03/26/2015 12:55    Anti-infectives: Anti-infectives    Start     Dose/Rate Route Frequency Ordered Stop   03/24/15 0930  ciprofloxacin (CIPRO) tablet 500 mg     500 mg Oral 2 times daily 03/24/15 0856     03/24/15 0930  metroNIDAZOLE (FLAGYL) tablet 500 mg     500 mg Oral Every 8 hours 03/24/15 0856     03/21/15 1000  ciprofloxacin (CIPRO) IVPB 400 mg  Status:  Discontinued     400 mg 200 mL/hr over 60 Minutes Intravenous Every 12 hours 03/21/15 0245 03/21/15 0714   03/21/15 0730  cefTRIAXone (ROCEPHIN) 2 g in dextrose 5 % 50 mL IVPB  Status:  Discontinued    Comments:  Pharmacy may adjust dosing strength / duration / interval for maximal efficacy   2 g 100 mL/hr over 30 Minutes Intravenous Every 24 hours 03/21/15 0715 03/24/15 0856   03/21/15 0600  metroNIDAZOLE (FLAGYL) IVPB 500 mg  Status:  Discontinued     500 mg 100 mL/hr over 60 Minutes Intravenous Every 8 hours 03/21/15 0245 03/24/15 0856   03/20/15 2030  ciprofloxacin (CIPRO) IVPB 400 mg     400 mg 200 mL/hr over 60 Minutes Intravenous  Once 03/20/15 2022 03/20/15 2219   03/20/15 2030  metroNIDAZOLE (FLAGYL) IVPB 500 mg     500 mg 100 mL/hr over 60 Minutes Intravenous  Once 03/20/15 2022 03/22/15 0809      Assessment/Plan:  HD#8 Acute sigmoid diverticulitis with abscess-wbc still up although clinically well, for ir drain today and then possibly home tomorrow. I dont think this will resolve without drainage and discussed that with her again today ID-cipro/flagyl for a total 14 day course.   FEN-soft diet can resume after drain today VTE prophylaxis -SCD/heparin Dispo-hopefully home tomorrow after drain  [facility] 03/28/2015

## 2015-03-28 NOTE — Progress Notes (Signed)
Patient going to IR for aspiration/drainage abdominal abscess.

## 2015-03-28 NOTE — Progress Notes (Signed)
Patient back from IR with dressing on right lower back with drain placed. Alert and oriented, patient denied any pain or other discomfort. Will continue to monitor.

## 2015-03-28 NOTE — Sedation Documentation (Signed)
Patient denies pain and is resting comfortably.  

## 2015-03-29 DIAGNOSIS — K5720 Diverticulitis of large intestine with perforation and abscess without bleeding: Principal | ICD-10-CM

## 2015-03-29 LAB — BASIC METABOLIC PANEL
Anion gap: 13 (ref 5–15)
BUN: <5 mg/dL — ABNORMAL LOW (ref 6–20)
CO2: 27 mmol/L (ref 22–32)
Calcium: 8.7 mg/dL — ABNORMAL LOW (ref 8.9–10.3)
Chloride: 96 mmol/L — ABNORMAL LOW (ref 101–111)
Creatinine, Ser: 1.03 mg/dL — ABNORMAL HIGH (ref 0.44–1.00)
GFR calc Af Amer: >60 mL/min (ref >60)
GFR calc non Af Amer: 56 mL/min — ABNORMAL LOW (ref >60)
Glucose, Bld: 91 mg/dL (ref 65–99)
Potassium: 4.6 mmol/L (ref 3.5–5.1)
Sodium: 136 mmol/L (ref 135–145)

## 2015-03-29 LAB — CBC
HCT: 36.2 % (ref 36.0–46.0)
Hemoglobin: 11.4 g/dL — ABNORMAL LOW (ref 12.0–15.0)
MCH: 28.9 pg (ref 26.0–34.0)
MCHC: 31.5 g/dL (ref 30.0–36.0)
MCV: 91.9 fL (ref 78.0–100.0)
Platelets: 338 10*3/uL (ref 150–400)
RBC: 3.94 MIL/uL (ref 3.87–5.11)
RDW: 14.1 % (ref 11.5–15.5)
WBC: 13.6 10*3/uL — ABNORMAL HIGH (ref 4.0–10.5)

## 2015-03-29 NOTE — Progress Notes (Signed)
[facility]                                                                              Patient Demographics  Veronica [name], is a 64 y.o. female, DOB - [birth year], [place] date - 03/20/2015   Admitting Physician Waldemar Dickens, MD  Outpatient Primary MD for the patient is Cathlean Cower, MD  LOS - 9   Chief Complaint  Patient presents with  . Abdominal Pain      HPI on 03/20/2015 by Dr. Linna Darner  Veronica [name] is a 64 y.o. female  Abdominal pain. Started 1 week ago. Getting worse. Comes and goes. Left lower quadrant initially but now more generalized. Associated with watery diarrhea. Denies bloody bowel movements, vomiting, fevers, dysuria, frequency. Pain is worse with meals. Some improvement after trying Dulcolax couple of days ago.  Interim history Patient admitted for acute sigmoid diverticulitis with abscess. Initially abscess was not amenable to drainage. Started on antibiotics Cipro and Flagyl currently on oral form. Repeat CT showed persistent 4.5 cm fluid collection.  IR placed drain  Assessment & Plan  Chart reviewed.  Acute sigmoid diverticulitis with abscess S/p perc drain. Patient reports difficulty ambulating with drain. PT eval. RN to teach husband drain care. Cont 10 abx.    Code Status: Full  Family Communication: None at bedside  Disposition Plan: d/c tomorrow   Time Spent in minutes   25 minutes  Procedures  None  Consults   General surgery Interventional radiology  DVT Prophylaxis  heparin  Lab Results  Component Value Date   PLT 338 03/29/2015    Medications  Scheduled Meds: . aspirin EC  81 mg Oral Daily  . ciprofloxacin  500 mg Oral BID  . famotidine  20 mg Oral BID  . heparin  5,000 Units Subcutaneous 3 times per day  . hydrochlorothiazide  12.5 mg Oral Daily  .  HYDROmorphone (DILAUDID) injection  1 mg Intravenous Once  . irbesartan  150 mg Oral Daily  . metroNIDAZOLE  500 mg Oral Q8H  .  ondansetron  4 mg Oral TID  . potassium chloride  40 mEq Oral BID  . saccharomyces boulardii  250 mg Oral BID  . sodium chloride  3 mL Intravenous Q12H  . sodium chloride  3 mL Intravenous Q12H   Continuous Infusions:   PRN Meds:.acetaminophen, alum & mag hydroxide-simeth, hydrALAZINE, HYDROcodone-acetaminophen, morphine injection, ondansetron (ZOFRAN) IV, sodium chloride  Antibiotics    Anti-infectives    Start     Dose/Rate Route Frequency Ordered Stop   03/24/15 0930  ciprofloxacin (CIPRO) tablet 500 mg     500 mg Oral 2 times daily 03/24/15 0856     03/24/15 0930  metroNIDAZOLE (FLAGYL) tablet 500 mg     500 mg Oral Every 8 hours 03/24/15 0856     03/21/15 1000  ciprofloxacin (CIPRO) IVPB 400 mg  Status:  Discontinued     400 mg 200 mL/hr over 60 Minutes Intravenous Every 12 hours 03/21/15 0245 03/21/15 0714   03/21/15 0730  cefTRIAXone (ROCEPHIN) 2 g in dextrose 5 % 50 mL IVPB  Status:  Discontinued    Comments:  Pharmacy may  adjust dosing strength / duration / interval for maximal efficacy   2 g 100 mL/hr over 30 Minutes Intravenous Every 24 hours 03/21/15 0715 03/24/15 0856   03/21/15 0600  metroNIDAZOLE (FLAGYL) IVPB 500 mg  Status:  Discontinued     500 mg 100 mL/hr over 60 Minutes Intravenous Every 8 hours 03/21/15 0245 03/24/15 0856   03/20/15 2030  ciprofloxacin (CIPRO) IVPB 400 mg     400 mg 200 mL/hr over 60 Minutes Intravenous  Once 03/20/15 2022 03/20/15 2219   03/20/15 2030  metroNIDAZOLE (FLAGYL) IVPB 500 mg     500 mg 100 mL/hr over 60 Minutes Intravenous  Once 03/20/15 2022 03/22/15 0809      Subjective:   Difficulty walking with drain. requesting d/c tomorrow so she can work with PT  Objective:   Filed Vitals:   03/28/15 1554 03/28/15 1636 03/28/15 2147 03/29/15 0509  BP: 120/63 136/62 127/62 124/58  Pulse: 67 65 67 70  Temp: 98 F (36.7 C) 98.4 F (36.9 C) 98.6 F (37 C) 97.9 F (36.6 C)  TempSrc: Oral Axillary Oral   Resp:   19 18  Height:       Weight:    125.42 kg (276 lb 8 oz)  SpO2: 97% 98% 97% 99%    Wt Readings from Last 3 Encounters:  03/29/15 125.42 kg (276 lb 8 oz)  03/20/15 121.11 kg (267 lb)  03/17/15 121.564 kg (268 lb)     Intake/Output Summary (Last 24 hours) at 03/29/15 1205 Last data filed at 03/29/15 0900  Gross per 24 hour  Intake    400 ml  Output      0 ml  Net    400 ml    Exam  General: Well developed, well nourished, NAD  HEENT: NCAT,  mucous membranes moist.   Cardiovascular: S1 S2 auscultated, RRR, no murmurs  Respiratory: Clear to auscultation bilaterally  Abdomen: Soft, obese, nontender, nondistended, +BS.  Extremities: warm dry without cyanosis clubbing or edema. Drain buttock. Dressing CDI  Data Review   Micro Results Recent Results (from the past 240 hour(s))  Culture, routine-abscess     Status: None (Preliminary result)   Collection Time: 03/28/15  3:30 PM  Result Value Ref Range Status   Specimen Description ABSCESS ABDOMEN  Final   Special Requests Normal  Final   Gram Stain   Final    FEW WBC PRESENT, PREDOMINANTLY PMN NO SQUAMOUS EPITHELIAL CELLS SEEN MODERATE GRAM POSITIVE COCCI IN PAIRS Performed at [facility]    Culture PENDING  Incomplete   Report Status PENDING  Incomplete  Anaerobic culture     Status: None (Preliminary result)   Collection Time: 03/28/15  3:30 PM  Result Value Ref Range Status   Specimen Description ABSCESS ABDOMEN  Final   Special Requests Normal  Final   Gram Stain   Final    FEW WBC PRESENT, PREDOMINANTLY PMN NO SQUAMOUS EPITHELIAL CELLS SEEN MODERATE GRAM POSITIVE COCCI IN PAIRS Performed at [facility]    Culture PENDING  Incomplete   Report Status PENDING  Incomplete    Radiology Reports Dg Abd 1 View  03/25/2015  CLINICAL DATA:  Abdominal pain. Constipation. Recent acute sigmoid diverticulitis. EXAM: ABDOMEN - 1 VIEW COMPARISON:  03/20/2015 CT abdomen/ pelvis. FINDINGS: There are no  disproportionately dilated small bowel loops to suggest small bowel obstruction. There is mild-to-moderate stool throughout the colon. No evidence of pneumatosis or pneumoperitoneum. Cholecystectomy clips are seen in the right upper  quadrant of the abdomen. Mild reversed S-shaped long segment thoracolumbar scoliosis with associated degenerative changes. IMPRESSION: Nonobstructive bowel gas pattern. Mild to moderate colonic stool volume. Electronically Signed   By: Ilona Sorrel M.D.   On: 03/25/2015 12:12   Ct Abdomen Pelvis W Contrast  03/26/2015  CLINICAL DATA:  Followup diverticulitis.  Abdominal pain. EXAM: CT ABDOMEN AND PELVIS WITH CONTRAST TECHNIQUE: Multidetector CT imaging of the abdomen and pelvis was performed using the standard protocol following bolus administration of intravenous contrast. CONTRAST:  146mL OMNIPAQUE IOHEXOL 300 MG/ML  SOLN COMPARISON:  03/20/2015 FINDINGS: Lung bases are clear.  Old rib fractures on the right. No acute liver finding. Previous cholecystectomy. The spleen is normal. The pancreas is normal. The adrenal glands are normal. The kidneys are normal. The aorta and IVC are normal. Ventral hernia again noted. This primarily contains fat. Presently, a small bit of the transverse colon extends into the hernia, not seen previously. Patient has acute diverticulitis in the sigmoid colon region with surrounding edema. There is a 4.5 cm abscess in the central pelvis which has become slightly more distinct when compared to the study of 6 days ago. 2 x 3 cm low-density in the left pelvic sidewall region is unchanged and more likely to represent a cyst of the left ovary. No bowel obstruction. No new bowel finding. IMPRESSION: Continued evidence of diverticulitis of the sigmoid colon. 4.5 cm fluid collection in the central pelvis appears more discrete than on the previous study and probably represents an abscess. Electronically Signed   By: Nelson Chimes M.D.   On: 03/26/2015 12:55   Ct  Abdomen Pelvis W Contrast  03/20/2015  ADDENDUM REPORT: 03/20/2015 16:15 ADDENDUM: These results were called by telephone at the time of interpretation on 03/20/2015 at 4:14 pm to Dr. Thersa Salt , who verbally acknowledged these results. Electronically Signed   By: Jerilynn Mages.  Shick M.D.   On: 03/20/2015 16:15  03/20/2015  CLINICAL DATA:  Acute lower abdominal and pelvic pain, dysuria, rectal pain, elevated white count. EXAM: CT ABDOMEN AND PELVIS WITH CONTRAST TECHNIQUE: Multidetector CT imaging of the abdomen and pelvis was performed using the standard protocol following bolus administration of intravenous contrast. CONTRAST:  187mL OMNIPAQUE IOHEXOL 300 MG/ML  SOLN COMPARISON:  None. FINDINGS: Lower chest: Healed right lower rib fractures with deformity. Lung bases remain clear. Normal heart size. No pericardial or pleural effusion. Degenerative changes of the lower thoracic spine. Small hiatal hernia evident. Abdomen: Prior cholecystectomy. Small midline fat containing umbilical hernia with an abdominal defect measuring 4.5 cm, image 43. This is just above the umbilicus. Liver, biliary system, pancreas, spleen, adrenal glands, and kidneys are within normal limits for age and demonstrate no acute process. Negative for bowel obstruction, dilatation, ileus, or significant free air. Retro aortic left renal vein noted, normal variant. Intact aorta. Negative for aneurysm. No acute retroperitoneal process. Pelvis: Diverticulosis present of the colon. Lower sigmoid and rectum demonstrates wall thickening with surrounding edema and inflammation compatible with diverticulitis. Difficult to exclude a mural lesion. Deep within the pelvis, 2 small fluid collections are present 1 the midline measures 39 x 31 mm posterior to the uterus image 65. Left adnexal small fluid collection versus ovarian cyst measures 30 x 21 mm, image 63. Urinary bladder collapsed. Uterus is tilted to the right. No adenopathy , inguinal abnormality, or  inguinal hernia. Degenerative changes of the lower lumbar spine and SI joints. IMPRESSION: Acute diverticulitis of the lower sigmoid colon and rectum. Midline pelvic small fluid  collection measuring 4 x 3 cm suspicious for associated abscess Left adnexal small fluid collection versus cystic area along the left ovary could represent a second abscess versus a small ovarian cyst measuring 30 x 21 mm. No associated obstruction or ileus Small fat containing ventral hernia superior to the umbilicus. Prior cholecystectomy Small hiatal hernia Electronically Signed: By: Jerilynn Mages.  Shick M.D. On: 03/20/2015 15:55   Ct Image Guided Drainage Percut Cath  Peritoneal Retroperit  03/28/2015  CLINICAL DATA:  Pelvic diverticular abscess requiring percutaneous drainage. EXAM: CT GUIDED DRAINAGE OF PERITONEAL DIVERTICULAR ABSCESS ANESTHESIA/SEDATION: 2.0 Mg IV Versed 100 mcg IV Fentanyl Total Moderate Sedation Time:  40 minutes PROCEDURE: The procedure, risks, benefits, and alternatives were explained to the patient. Questions regarding the procedure were encouraged and answered. The patient understands and consents to the procedure. A time-out was performed prior to the procedure. The right gluteal region was prepped with Betadine in a sterile fashion, and a sterile drape was applied covering the operative field. A sterile gown and sterile gloves were used for the procedure. Local anesthesia was provided with 1% Lidocaine. CT was performed in a prone position. Under CT guidance, an 18 gauge trocar needle was advanced from a right transgluteal approach into the pelvis. After determining needle tip position, aspiration of fluid was performed and a sample obtained for culture analysis. A guidewire was advanced into the collection. The tract was dilated and a 12 French percutaneous drainage catheter placed. This catheter was flushed with saline and connected to a suction bulb. The catheter was secured at the skin with a Prolene retention  suture and StatLock device. COMPLICATIONS: None FINDINGS: After advancing a needle into the diverticular abscess of the central pelvis, aspiration yielded grossly purulent fluid. A sample was sent for culture analysis. A 12 French drain was placed. IMPRESSION: CT-guided drainage of peritoneal diverticular abscess located in the central pelvis. A 12 French catheter was placed and connected to suction bulb drainage. Electronically Signed   By: Aletta Edouard M.D.   On: 03/28/2015 16:38    CBC  Recent Labs Lab 03/25/15 0410 03/26/15 0806 03/27/15 0412 03/28/15 0423 03/29/15 0555  WBC 10.1 8.7 10.9* 11.5* 13.6*  HGB 10.3* 10.9* 10.4* 10.8* 11.4*  HCT 31.5* 34.2* 33.3* 33.7* 36.2  PLT 280 301 308 317 338  MCV 88.2 89.3 90.0 90.6 91.9  MCH 28.9 28.5 28.1 29.0 28.9  MCHC 32.7 31.9 31.2 32.0 31.5  RDW 13.0 13.3 13.5 13.9 14.1    Chemistries   Recent Labs Lab 03/25/15 0933 03/25/15 1055 03/26/15 0806 03/27/15 0412 03/28/15 0423 03/29/15 0555  NA 135  --  136 139 139 136  K 2.9*  --  3.9 4.4 4.5 4.6  CL 97*  --  97* 101 102 96*  CO2 31  --  33* 33* 31 27  GLUCOSE 98  --  93 88 85 91  BUN <5*  --  <5* <5* <5* <5*  CREATININE 0.74  --  0.82 0.93 0.93 1.03*  CALCIUM 8.0*  --  8.1* 8.3* 8.6* 8.7*  MG  --  1.7  --   --   --   --    ------------------------------------------------------------------------------------------------------------------ estimated creatinine clearance is 73.7 mL/min (by C-G formula based on Cr of 1.03). ------------------------------------------------------------------------------------------------------------------ No results for input(s): HGBA1C in the last 72 hours. ------------------------------------------------------------------------------------------------------------------ No results for input(s): CHOL, HDL, LDLCALC, TRIG, CHOLHDL, LDLDIRECT in the last 72  hours. ------------------------------------------------------------------------------------------------------------------ No results for input(s): TSH, T4TOTAL, T3FREE, THYROIDAB in the last 72  hours.  Invalid input(s): FREET3 ------------------------------------------------------------------------------------------------------------------ No results for input(s): VITAMINB12, FOLATE, FERRITIN, TIBC, IRON, RETICCTPCT in the last 72 hours.  Coagulation profile  Recent Labs Lab 03/28/15 0423  INR 1.23    No results for input(s): DDIMER in the last 72 hours.  Cardiac Enzymes No results for input(s): CKMB, TROPONINI, MYOGLOBIN in the last 168 hours.  Invalid input(s): CK ------------------------------------------------------------------------------------------------------------------ Invalid input(s): POCBNP    Yoan Sallade L MD on 03/29/2015 at 12:05 PM  [facility]

## 2015-03-29 NOTE — Progress Notes (Signed)
Demonstrated to patient's spouse how to flush drain. He successfully demonstrated flushing jp drain, charging drain, measuring output and changing dressing.

## 2015-03-29 NOTE — Progress Notes (Signed)
Referring Physician(s): CCS  Chief Complaint: Diverticular abscess S/p right TG perc drain placed 10/11  Subjective: Patient states her overall pelvic pain has improved post drain. She does admit to some soreness at drain site. She is tolerating a regular diet without nausea or vomiting and denies any fever or chills.   Allergies: Clarithromycin  Medications: Prior to Admission medications   Medication Sig Start Date End Date Taking? Authorizing Provider  acetaminophen (TYLENOL) 650 MG CR tablet Take 650 mg by mouth every 8 (eight) hours as needed for pain.   Yes Historical Provider, MD  aspirin 81 MG EC tablet Take 1 tablet (81 mg total) by mouth daily. Swallow whole. Patient taking differently: Take 81 mg by mouth every 14 (fourteen) days. Swallow whole. 11/27/12  Yes Biagio Borg, MD  cephALEXin (KEFLEX) 500 MG capsule Take 1 capsule (500 mg total) by mouth 4 (four) times daily. 03/17/15  Yes Biagio Borg, MD  irbesartan-hydrochlorothiazide (AVALIDE) 150-12.5 MG per tablet TAKE 2 TABLETS BY MOUTH ONCE DAILY 11/30/13  Yes Biagio Borg, MD  traMADol Veatrice Bourbon) 50 MG tablet take 1 tablet by mouth every 6 hours if needed for pain 12/23/14  Yes Biagio Borg, MD   Vital Signs: BP 124/58 mmHg  Pulse 70  Temp(Src) 97.9 F (36.6 C) (Oral)  Resp 18  Ht 5\' 6"  (1.676 m)  Wt 276 lb 8 oz (125.42 kg)  BMI 44.65 kg/m2  SpO2 99%  Physical Exam General: A&Ox3, NAD ABD: Soft, slightly tender at right TG drain site, abd/pelvic NT, 15 cc serosang output in JP bulb  Imaging: Dg Abd 1 View  03/25/2015  CLINICAL DATA:  Abdominal pain. Constipation. Recent acute sigmoid diverticulitis. EXAM: ABDOMEN - 1 VIEW COMPARISON:  03/20/2015 CT abdomen/ pelvis. FINDINGS: There are no disproportionately dilated small bowel loops to suggest small bowel obstruction. There is mild-to-moderate stool throughout the colon. No evidence of pneumatosis or pneumoperitoneum. Cholecystectomy clips are seen in the right  upper quadrant of the abdomen. Mild reversed S-shaped long segment thoracolumbar scoliosis with associated degenerative changes. IMPRESSION: Nonobstructive bowel gas pattern. Mild to moderate colonic stool volume. Electronically Signed   By: Ilona Sorrel M.D.   On: 03/25/2015 12:12   Ct Abdomen Pelvis W Contrast  03/26/2015  CLINICAL DATA:  Followup diverticulitis.  Abdominal pain. EXAM: CT ABDOMEN AND PELVIS WITH CONTRAST TECHNIQUE: Multidetector CT imaging of the abdomen and pelvis was performed using the standard protocol following bolus administration of intravenous contrast. CONTRAST:  152mL OMNIPAQUE IOHEXOL 300 MG/ML  SOLN COMPARISON:  03/20/2015 FINDINGS: Lung bases are clear.  Old rib fractures on the right. No acute liver finding. Previous cholecystectomy. The spleen is normal. The pancreas is normal. The adrenal glands are normal. The kidneys are normal. The aorta and IVC are normal. Ventral hernia again noted. This primarily contains fat. Presently, a small bit of the transverse colon extends into the hernia, not seen previously. Patient has acute diverticulitis in the sigmoid colon region with surrounding edema. There is a 4.5 cm abscess in the central pelvis which has become slightly more distinct when compared to the study of 6 days ago. 2 x 3 cm low-density in the left pelvic sidewall region is unchanged and more likely to represent a cyst of the left ovary. No bowel obstruction. No new bowel finding. IMPRESSION: Continued evidence of diverticulitis of the sigmoid colon. 4.5 cm fluid collection in the central pelvis appears more discrete than on the previous study and probably represents an  abscess. Electronically Signed   By: Nelson Chimes M.D.   On: 03/26/2015 12:55   Ct Image Guided Drainage Percut Cath  Peritoneal Retroperit  03/28/2015  CLINICAL DATA:  Pelvic diverticular abscess requiring percutaneous drainage. EXAM: CT GUIDED DRAINAGE OF PERITONEAL DIVERTICULAR ABSCESS  ANESTHESIA/SEDATION: 2.0 Mg IV Versed 100 mcg IV Fentanyl Total Moderate Sedation Time:  40 minutes PROCEDURE: The procedure, risks, benefits, and alternatives were explained to the patient. Questions regarding the procedure were encouraged and answered. The patient understands and consents to the procedure. A time-out was performed prior to the procedure. The right gluteal region was prepped with Betadine in a sterile fashion, and a sterile drape was applied covering the operative field. A sterile gown and sterile gloves were used for the procedure. Local anesthesia was provided with 1% Lidocaine. CT was performed in a prone position. Under CT guidance, an 18 gauge trocar needle was advanced from a right transgluteal approach into the pelvis. After determining needle tip position, aspiration of fluid was performed and a sample obtained for culture analysis. A guidewire was advanced into the collection. The tract was dilated and a 12 French percutaneous drainage catheter placed. This catheter was flushed with saline and connected to a suction bulb. The catheter was secured at the skin with a Prolene retention suture and StatLock device. COMPLICATIONS: None FINDINGS: After advancing a needle into the diverticular abscess of the central pelvis, aspiration yielded grossly purulent fluid. A sample was sent for culture analysis. A 12 French drain was placed. IMPRESSION: CT-guided drainage of peritoneal diverticular abscess located in the central pelvis. A 12 French catheter was placed and connected to suction bulb drainage. Electronically Signed   By: Aletta Edouard M.D.   On: 03/28/2015 16:38    Labs:  CBC:  Recent Labs  03/26/15 0806 03/27/15 0412 03/28/15 0423 03/29/15 0555  WBC 8.7 10.9* 11.5* 13.6*  HGB 10.9* 10.4* 10.8* 11.4*  HCT 34.2* 33.3* 33.7* 36.2  PLT 301 308 317 338    COAGS:  Recent Labs  03/21/15 0515 03/28/15 0423  INR 1.32 1.23  APTT 29 31    BMP:  Recent Labs   03/26/15 0806 03/27/15 0412 03/28/15 0423 03/29/15 0555  NA 136 139 139 136  K 3.9 4.4 4.5 4.6  CL 97* 101 102 96*  CO2 33* 33* 31 27  GLUCOSE 93 88 85 91  BUN <5* <5* <5* <5*  CALCIUM 8.1* 8.3* 8.6* 8.7*  CREATININE 0.82 0.93 0.93 1.03*  GFRNONAA >60 >60 >60 56*  GFRAA >60 >60 >60 >60    LIVER FUNCTION TESTS:  Recent Labs  12/13/14 0929 03/20/15 1315 03/20/15 1732  BILITOT 1.3* 1.6* 1.5*  AST 15 25 23   ALT 13 22 21   ALKPHOS 80 81 82  PROT 7.6 8.1 7.7  ALBUMIN 3.6 3.1* 3.0*    Assessment and Plan: Diverticular abscess S/p right TG perc drain placed 10/11 Wbc slightly up-likely reactive, afebrile, H/H stable, Cx pending/gram (+) cocci in pairs, serosang output 15 cc in bulb Plans per [facility] When discharged IR will arrange drain clinic f/u with repeat imaging  Continue to flush atleast BID with 88ml sterile saline and monitor daily output    Signed: Hedy Jacob 03/29/2015, 10:23 AM   I spent a total of 15 Minutes at the the patient's bedside AND on the patient's hospital floor or unit, greater than 50% of which was counseling/coordinating care for diverticular abscess

## 2015-03-29 NOTE — Care Management Note (Signed)
Case Management Note  Patient Details  Name: ALEXAH KIVETT MRN: [id number] Date of Birth: 1950-10-15  Subjective/Objective:                    Action/Plan:  Asked bedside nurse to begin drain teaching with patient / family . Expected Discharge Date:                  Expected Discharge Plan:  [place]  In-House Referral:     Discharge planning Services  CM Consult  Post Acute Care Choice:    Choice offered to:  Patient  DME Arranged:    DME Agency:     HH Arranged:  RN [place] Agency:  [facility]  Status of Service:  Completed, signed off  Medicare Important Message Given:    Date Medicare IM Given:    Medicare IM give by:    Date Additional Medicare IM Given:    Additional Medicare Important Message give by:     If discussed at [place] of Stay Meetings, dates discussed:    Additional Comments:  Marilu Favre, RN 03/29/2015, 9:55 AM

## 2015-03-29 NOTE — Progress Notes (Signed)
Patient ID: Veronica [name], female   DOB: 12/23/1950, 64 y.o.   MRN: [id number]    Subjective: Pt feels well today except pain in buttocks from drain.  Tolerating a soft diet and feeling well.  Objective: Vital signs in last 24 hours: Temp:  [97.9 F (36.6 C)-98.6 F (37 C)] 97.9 F (36.6 C) (10/12 0509) Pulse Rate:  [65-77] 70 (10/12 0509) Resp:  [18-24] 18 (10/12 0509) BP: (108-136)/(55-68) 124/58 mmHg (10/12 0509) SpO2:  [97 %-100 %] 99 % (10/12 0509) Weight:  [125.42 kg (276 lb 8 oz)] 125.42 kg (276 lb 8 oz) (10/12 0509) Last BM Date: 03/27/15  Intake/Output from previous day: 10/11 0701 - 10/12 0700 In: 320 [P.O.:320] Out: -  Intake/Output this shift:    PE: Abd: soft, NT, ND, obese, +BS, JP drain with serosang drainage only  Lab Results:   Recent Labs  03/28/15 0423 03/29/15 0555  WBC 11.5* 13.6*  HGB 10.8* 11.4*  HCT 33.7* 36.2  PLT 317 338   BMET  Recent Labs  03/28/15 0423 03/29/15 0555  NA 139 136  K 4.5 4.6  CL 102 96*  CO2 31 27  GLUCOSE 85 91  BUN <5* <5*  CREATININE 0.93 1.03*  CALCIUM 8.6* 8.7*   PT/INR  Recent Labs  03/28/15 0423  LABPROT 15.7*  INR 1.23   CMP     Component Value Date/Time   NA 136 03/29/2015 0555   K 4.6 03/29/2015 0555   CL 96* 03/29/2015 0555   CO2 27 03/29/2015 0555   GLUCOSE 91 03/29/2015 0555   BUN <5* 03/29/2015 0555   CREATININE 1.03* 03/29/2015 0555   CALCIUM 8.7* 03/29/2015 0555   PROT 7.7 03/20/2015 1732   ALBUMIN 3.0* 03/20/2015 1732   AST 23 03/20/2015 1732   ALT 21 03/20/2015 1732   ALKPHOS 82 03/20/2015 1732   BILITOT 1.5* 03/20/2015 1732   GFRNONAA 56* 03/29/2015 0555   GFRAA >60 03/29/2015 0555   Lipase     Component Value Date/Time   LIPASE 19* 03/20/2015 1732       Studies/Results: Ct Image Guided Drainage Percut Cath  Peritoneal Retroperit  03/28/2015  CLINICAL DATA:  Pelvic diverticular abscess requiring percutaneous drainage. EXAM: CT GUIDED DRAINAGE OF PERITONEAL  DIVERTICULAR ABSCESS ANESTHESIA/SEDATION: 2.0 Mg IV Versed 100 mcg IV Fentanyl Total Moderate Sedation Time:  40 minutes PROCEDURE: The procedure, risks, benefits, and alternatives were explained to the patient. Questions regarding the procedure were encouraged and answered. The patient understands and consents to the procedure. A time-out was performed prior to the procedure. The right gluteal region was prepped with Betadine in a sterile fashion, and a sterile drape was applied covering the operative field. A sterile gown and sterile gloves were used for the procedure. Local anesthesia was provided with 1% Lidocaine. CT was performed in a prone position. Under CT guidance, an 18 gauge trocar needle was advanced from a right transgluteal approach into the pelvis. After determining needle tip position, aspiration of fluid was performed and a sample obtained for culture analysis. A guidewire was advanced into the collection. The tract was dilated and a 12 French percutaneous drainage catheter placed. This catheter was flushed with saline and connected to a suction bulb. The catheter was secured at the skin with a Prolene retention suture and StatLock device. COMPLICATIONS: None FINDINGS: After advancing a needle into the diverticular abscess of the central pelvis, aspiration yielded grossly purulent fluid. A sample was sent for culture analysis. A 12 [place] drain  was placed. IMPRESSION: CT-guided drainage of peritoneal diverticular abscess located in the central pelvis. A 12 French catheter was placed and connected to suction bulb drainage. Electronically Signed   By: Aletta Edouard M.D.   On: 03/28/2015 16:38    Anti-infectives: Anti-infectives    Start     Dose/Rate Route Frequency Ordered Stop   03/24/15 0930  ciprofloxacin (CIPRO) tablet 500 mg     500 mg Oral 2 times daily 03/24/15 0856     03/24/15 0930  metroNIDAZOLE (FLAGYL) tablet 500 mg     500 mg Oral Every 8 hours 03/24/15 0856     03/21/15  1000  ciprofloxacin (CIPRO) IVPB 400 mg  Status:  Discontinued     400 mg 200 mL/hr over 60 Minutes Intravenous Every 12 hours 03/21/15 0245 03/21/15 0714   03/21/15 0730  cefTRIAXone (ROCEPHIN) 2 g in dextrose 5 % 50 mL IVPB  Status:  Discontinued    Comments:  Pharmacy may adjust dosing strength / duration / interval for maximal efficacy   2 g 100 mL/hr over 30 Minutes Intravenous Every 24 hours 03/21/15 0715 03/24/15 0856   03/21/15 0600  metroNIDAZOLE (FLAGYL) IVPB 500 mg  Status:  Discontinued     500 mg 100 mL/hr over 60 Minutes Intravenous Every 8 hours 03/21/15 0245 03/24/15 0856   03/20/15 2030  ciprofloxacin (CIPRO) IVPB 400 mg     400 mg 200 mL/hr over 60 Minutes Intravenous  Once 03/20/15 2022 03/20/15 2219   03/20/15 2030  metroNIDAZOLE (FLAGYL) IVPB 500 mg     500 mg 100 mL/hr over 60 Minutes Intravenous  Once 03/20/15 2022 03/22/15 0809       Assessment/Plan  HD#9 Acute sigmoid diverticulitis with abscess -drain in place with serosang output.  Cx prelim showing gram + cocci. -cont C/F.  14 day course total -WBC up slightly today, suspect this is reactive to drain, considering clinically patient feels well -cont Low fiber diet -surgically stable for dc home. -HH arranged for drain flushing at home -she will need to follow up in IR drain clinic per their discretion  -follow up with Dr. Brantley Stage in 3-4 weeks in our office ID-cipro/flagyl for a total 14 day course.  FEN-low fiber for 2-3 weeks, then switch to high fiber diet.  Nutrition information given to patient in DC instructions VTE prophylaxis -SCD/heparin Dispo-stable surgically for DC home, when medically felt stable   LOS: 9 days    Terreon Ekholm E 03/29/2015, 8:11 AM Pager: [phone]

## 2015-03-29 NOTE — Discharge Instructions (Signed)
Diverticulitis Diverticulitis is inflammation or infection of small pouches in your colon that form when you have a condition called diverticulosis. The pouches in your colon are called diverticula. Your colon, or large intestine, is where water is absorbed and stool is formed. Complications of diverticulitis can include:  Bleeding.  Severe infection.  Severe pain.  Perforation of your colon.  Obstruction of your colon. CAUSES  Diverticulitis is caused by bacteria. Diverticulitis happens when stool becomes trapped in diverticula. This allows bacteria to grow in the diverticula, which can lead to inflammation and infection. RISK FACTORS People with diverticulosis are at risk for diverticulitis. Eating a diet that does not include enough fiber from fruits and vegetables may make diverticulitis more likely to develop. SYMPTOMS  Symptoms of diverticulitis may include:  Abdominal pain and tenderness. The pain is normally located on the left side of the abdomen, but may occur in other areas.  Fever and chills.  Bloating.  Cramping.  Nausea.  Vomiting.  Constipation.  Diarrhea.  Blood in your stool. DIAGNOSIS  Your health care provider will ask you about your medical history and do a physical exam. You may need to have tests done because many medical conditions can cause the same symptoms as diverticulitis. Tests may include:  Blood tests.  Urine tests.  Imaging tests of the abdomen, including X-rays and CT scans. When your condition is under control, your health care provider may recommend that you have a colonoscopy. A colonoscopy can show how severe your diverticula are and whether something else is causing your symptoms. TREATMENT  Most cases of diverticulitis are mild and can be treated at home. Treatment may include:  Taking over-the-counter pain medicines.  Following a clear liquid diet.  Taking antibiotic medicines by mouth for 7-10 days. More severe cases may  be treated at a hospital. Treatment may include:  Not eating or drinking.  Taking prescription pain medicine.  Receiving antibiotic medicines through an IV tube.  Receiving fluids and nutrition through an IV tube.  Surgery. HOME CARE INSTRUCTIONS   Follow your health care provider's instructions carefully.  Follow a full liquid diet or other diet as directed by your health care provider. After your symptoms improve, your health care provider may tell you to change your diet. He or she may recommend you eat a high-fiber diet. Fruits and vegetables are good sources of fiber. Fiber makes it easier to pass stool.  Take fiber supplements or probiotics as directed by your health care provider.  Only take medicines as directed by your health care provider.  Keep all your follow-up appointments. SEEK MEDICAL CARE IF:   Your pain does not improve.  You have a hard time eating food.  Your bowel movements do not return to normal. SEEK IMMEDIATE MEDICAL CARE IF:   Your pain becomes worse.  Your symptoms do not get better.  Your symptoms suddenly get worse.  You have a fever.  You have repeated vomiting.  You have bloody or black, tarry stools. MAKE SURE YOU:   Understand these instructions.  Will watch your condition.  Will get help right away if you are not doing well or get worse.   This information is not intended to replace advice given to you by your health care provider. Make sure you discuss any questions you have with your health care provider.   Document Released: 03/13/2005 Document Revised: 06/08/2013 Document Reviewed: 04/28/2013 Elsevier Interactive Patient Education 2016 [place] A bulb drain consists  of a thin rubber tube and a soft, round bulb that creates a gentle suction. The rubber tube is placed in the area where you had surgery. A bulb is attached to the end of the tube that is outside the body. The bulb drain removes excess  fluid that normally builds up in a surgical wound after surgery. The color and amount of fluid will vary. Immediately after surgery, the fluid is bright red and is a little thicker than water. It may gradually change to a yellow or pink color and become more thin and water-like. When the amount decreases to about 1 or 2 tbsp in 24 hours, your health care provider will usually remove it. DAILY CARE  Keep the bulb flat (compressed) at all times, except while emptying it. The flatness creates suction. You can flatten the bulb by squeezing it firmly in the middle and then closing the cap.  Keep sites where the tube enters the skin dry and covered with a bandage (dressing).  Secure the tube 1-2 in (2.5-5.1 cm) below the insertion sites to keep it from pulling on your stitches. The tube is stitched in place and will not slip out.  Secure the bulb as directed by your health care provider.  For the first 3 days after surgery, there usually is more fluid in the bulb. Empty the bulb whenever it becomes half full because the bulb does not create enough suction if it is too full. The bulb could also overflow. Write down how much fluid you remove each time you empty your drain. Add up the amount removed in 24 hours.  Empty the bulb at the same time every day once the amount of fluid decreases and you only need to empty it once a day. Write down the amounts and the 24-hour totals to give to your health care provider. This helps your health care provider know when the tubes can be removed. EMPTYING THE BULB DRAIN Before emptying the bulb, get a measuring cup, a piece of paper and a pen, and wash your hands.  Gently run your fingers down the tube (stripping) to empty any drainage from the tubing into the bulb. This may need to be done several times a day to clear the tubing of clots and tissue.  Open the bulb cap to release suction, which causes it to inflate. Do not touch the inside of the cap.  Gently run  your fingers down the tube (stripping) to empty any drainage from the tubing into the bulb.  Hold the cap out of the way, and pour fluid into the measuring cup.   Squeeze the bulb to provide suction.  Replace the cap.   Check the tape that holds the tube to your skin. If it is becoming loose, you can remove the loose piece of tape and apply a new one. Then, pin the bulb to your shirt.   Write down the amount of fluid you emptied out. Write down the date and each time you emptied your bulb drain. (If there are 2 bulbs, note the amount of drainage from each bulb and keep the totals separate. Your health care provider will want to know the total amounts for each drain and which tube is draining more.)   Flush the fluid down the toilet and wash your hands.   Call your health care provider once you have less than 2 tbsp of fluid collecting in the bulb drain every 24 hours. If there is drainage around the tube site,  change dressings and keep the area dry. Cleanse around tube with sterile saline and place dry gauze around site. This gauze should be changed when it is soiled. If it stays clean and unsoiled, it should still be changed daily.  SEEK MEDICAL CARE IF:  Your drainage has a bad smell or is cloudy.   You have a fever.   Your drainage is increasing instead of decreasing.   Your tube fell out.   You have redness or swelling around the tube site.   You have drainage from a surgical wound.   Your bulb drain will not stay flat after you empty it.  MAKE SURE YOU:   Understand these instructions.  Will watch your condition.  Will get help right away if you are not doing well or get worse.   This information is not intended to replace advice given to you by your health care provider. Make sure you discuss any questions you have with your health care provider.   Document Released: 05/31/2000 Document Revised: 06/24/2014 Document Reviewed: 12/21/2014 Elsevier Interactive  Patient Education 2016 [employer].  Low-Fiber Diet, for 2-3 weeks and then switch to high fiber Fiber is found in fruits, vegetables, and whole grains. A low-fiber diet restricts fibrous foods that are not digested in the small intestine. A diet containing about 10-15 grams of fiber per day is considered low fiber. Low-fiber diets may be used to:  Promote healing and rest the bowel during intestinal flare-ups.  Prevent blockage of a partially obstructed or narrowed gastrointestinal tract.  Reduce fecal weight and volume.  Slow the movement of feces. You may be on a low-fiber diet as a transitional diet following surgery, after an injury (trauma), or because of a short (acute) or lifelong (chronic) illness. Your health care provider will determine the length of time you need to stay on this diet.  WHAT DO I NEED TO KNOW ABOUT A LOW-FIBER DIET? Always check the fiber content on the packaging's Nutrition Facts label, especially on foods from the grains list. Ask your dietitian if you have questions about specific foods that are related to your condition, especially if the food is not listed below. In general, a low-fiber food will have less than 2 g of fiber. WHAT FOODS CAN I EAT? Grains All breads and crackers made with white flour. Sweet rolls, doughnuts, waffles, pancakes, [place] toast, bagels. Pretzels, Melba toast, zwieback. Well-cooked cereals, such as cornmeal, farina, or cream cereals. Dry cereals that do not contain whole grains, fruit, or nuts, such as refined corn, wheat, rice, and oat cereals. Potatoes prepared any way without skins, plain pastas and noodles, refined white rice. Use white flour for baking and making sauces. Use allowed list of grains for casseroles, dumplings, and puddings.  Vegetables Strained tomato and vegetable juices. Fresh lettuce, cucumber, spinach. Well-cooked (no skin or pulp) or canned vegetables, such as asparagus, bean sprouts, beets, carrots, green beans,  mushrooms, potatoes, pumpkin, spinach, yellow squash, tomato sauce/puree, turnips, yams, and zucchini. Keep servings limited to  cup.  Fruits All fruit juices except prune juice. Cooked or canned fruits without skin and seeds, such as applesauce, apricots, cherries, fruit cocktail, grapefruit, grapes, mandarin oranges, melons, peaches, pears, pineapple, and plums. Fresh fruits without skin, such as apricots, avocados, bananas, melons, pineapple, nectarines, and peaches. Keep servings limited to  cup or 1 piece.  Meat and Other Protein Sources Ground or well-cooked tender beef, ham, veal, lamb, pork, or poultry. Eggs, plain cheese. Fish, oysters, shrimp, lobster, and  other seafood. Liver, organ meats. Smooth nut butters. Dairy All milk products and alternative dairy substitutes, such as soy, rice, almond, and coconut, not containing added whole nuts, seeds, or added fruit. Beverages Decaf coffee, fruit, and vegetable juices or smoothies (small amounts, with no pulp or skins, and with fruits from allowed list), sports drinks, herbal tea. Condiments Ketchup, mustard, vinegar, cream sauce, cheese sauce, cocoa powder. Spices in moderation, such as allspice, basil, bay leaves, celery powder or leaves, cinnamon, cumin powder, curry powder, ginger, mace, marjoram, onion or garlic powder, oregano, paprika, parsley flakes, ground pepper, rosemary, sage, savory, tarragon, thyme, and turmeric. Sweets and Desserts Plain cakes and cookies, pie made with allowed fruit, pudding, custard, cream pie. Gelatin, fruit, ice, sherbet, frozen ice pops. Ice cream, ice milk without nuts. Plain hard candy, honey, jelly, molasses, syrup, sugar, chocolate syrup, gumdrops, marshmallows. Limit overall sugar intake.  Fats and Oil Margarine, butter, cream, mayonnaise, salad oils, plain salad dressings made from allowed foods. Choose healthy fats such as olive oil, canola oil, and omega-3 fatty acids (such as found in salmon or  tuna) when possible.  Other Bouillon, broth, or cream soups made from allowed foods. Any strained soup. Casseroles or mixed dishes made with allowed foods. The items listed above may not be a complete list of recommended foods or beverages. Contact your dietitian for more options.  WHAT FOODS ARE NOT RECOMMENDED? Grains All whole wheat and whole grain breads and crackers. Multigrains, rye, bran seeds, nuts, or coconut. Cereals containing whole grains, multigrains, bran, coconut, nuts, raisins. Cooked or dry oatmeal, steel-cut oats. Coarse wheat cereals, granola. Cereals advertised as high fiber. Potato skins. Whole grain pasta, wild or brown rice. Popcorn. Coconut flour. Bran, buckwheat, corn bread, multigrains, rye, wheat germ.  Vegetables Fresh, cooked or canned vegetables, such as artichokes, asparagus, beet greens, broccoli, Brussels sprouts, cabbage, celery, cauliflower, corn, eggplant, kale, legumes or beans, okra, peas, and tomatoes. Avoid large servings of any vegetables, especially raw vegetables.  Fruits Fresh fruits, such as apples with or without skin, berries, cherries, figs, grapes, grapefruit, guavas, kiwis, mangoes, oranges, papayas, pears, persimmons, pineapple, and pomegranate. Prune juice and juices with pulp, stewed or dried prunes. Dried fruits, dates, raisins. Fruit seeds or skins. Avoid large servings of all fresh fruits. Meats and Other Protein Sources Tough, fibrous meats with gristle. Chunky nut butter. Cheese made with seeds, nuts, or other foods not recommended. Nuts, seeds, legumes (beans, including baked beans), dried peas, beans, lentils.  Dairy Yogurt or cheese that contains nuts, seeds, or added fruit.  Beverages Fruit juices with high pulp, prune juice. Caffeinated coffee and teas.  Condiments Coconut, maple syrup, pickles, olives. Sweets and Desserts Desserts, cookies, or candies that contain nuts or coconut, chunky peanut butter, dried fruits. Jams, preserves  with seeds, marmalade. Large amounts of sugar and sweets. Any other dessert made with fruits from the not recommended list.  Other Soups made from vegetables that are not recommended or that contain other foods not recommended.  The items listed above may not be a complete list of foods and beverages to avoid. Contact your dietitian for more information.   This information is not intended to replace advice given to you by your health care provider. Make sure you discuss any questions you have with your health care provider.   Document Released: 11/23/2001 Document Revised: 06/08/2013 Document Reviewed: 04/26/2013 Elsevier Interactive Patient Education 2016 Else[employer]  High-Fiber Diet Fiber, also called dietary fiber, is a type of carbohydrate found in  fruits, vegetables, whole grains, and beans. A high-fiber diet can have many health benefits. Your health care provider may recommend a high-fiber diet to help:  Prevent constipation. Fiber can make your bowel movements more regular.  Lower your cholesterol.  Relieve hemorrhoids, uncomplicated diverticulosis, or irritable bowel syndrome.  Prevent overeating as part of a weight-loss plan.  Prevent heart disease, type 2 diabetes, and certain cancers. WHAT IS MY PLAN? The recommended daily intake of fiber includes:  38 grams for men under age 66.  23 grams for men over age 6.  53 grams for women under age 32.  48 grams for women over age 32. You can get the recommended daily intake of dietary fiber by eating a variety of fruits, vegetables, grains, and beans. Your health care provider may also recommend a fiber supplement if it is not possible to get enough fiber through your diet. WHAT DO I NEED TO KNOW ABOUT A HIGH-FIBER DIET?  Fiber supplements have not been widely studied for their effectiveness, so it is better to get fiber through food sources.  Always check the fiber content on thenutrition facts label of any prepackaged  food. Look for foods that contain at least 5 grams of fiber per serving.  Ask your dietitian if you have questions about specific foods that are related to your condition, especially if those foods are not listed in the following section.  Increase your daily fiber consumption gradually. Increasing your intake of dietary fiber too quickly may cause bloating, cramping, or gas.  Drink plenty of water. Water helps you to digest fiber. WHAT FOODS CAN I EAT? Grains Whole-grain breads. Multigrain cereal. Oats and oatmeal. Brown rice. Barley. Bulgur wheat. [place]. Bran muffins. Popcorn. Rye wafer crackers. Vegetables Sweet potatoes. Spinach. Kale. Artichokes. Cabbage. Broccoli. Green peas. Carrots. Squash. Fruits Berries. Pears. Apples. Oranges. Avocados. Prunes and raisins. Dried figs. Meats and Other Protein Sources [place], kidney, pinto, and soy beans. Split peas. Lentils. Nuts and seeds. Dairy Fiber-fortified yogurt. Beverages Fiber-fortified soy milk. Fiber-fortified orange juice. Other Fiber bars. The items listed above may not be a complete list of recommended foods or beverages. Contact your dietitian for more options. WHAT FOODS ARE NOT RECOMMENDED? Grains White bread. Pasta made with refined flour. White rice. Vegetables Fried potatoes. Canned vegetables. Well-cooked vegetables.  Fruits Fruit juice. Cooked, strained fruit. Meats and Other Protein Sources Fatty cuts of meat. Fried Sales executive or fried fish. Dairy Milk. Yogurt. Cream cheese. Sour cream. Beverages Soft drinks. Other Cakes and pastries. Butter and oils. The items listed above may not be a complete list of foods and beverages to avoid. Contact your dietitian for more information. WHAT ARE SOME TIPS FOR INCLUDING HIGH-FIBER FOODS IN MY DIET?  Eat a wide variety of high-fiber foods.  Make sure that half of all grains consumed each day are whole grains.  Replace breads and cereals made from refined flour or white  flour with whole-grain breads and cereals.  Replace white rice with brown rice, bulgur wheat, or millet.  Start the day with a breakfast that is high in fiber, such as a cereal that contains at least 5 grams of fiber per serving.  Use beans in place of meat in soups, salads, or pasta.  Eat high-fiber snacks, such as berries, raw vegetables, nuts, or popcorn.   This information is not intended to replace advice given to you by your health care provider. Make sure you discuss any questions you have with your health care provider.   Document Released:  06/03/2005 Document Revised: 06/24/2014 Document Reviewed: 11/16/2013 Elsevier Interactive Patient Education [employer].

## 2015-03-30 ENCOUNTER — Other Ambulatory Visit (HOSPITAL_COMMUNITY): Payer: Self-pay | Admitting: Radiology

## 2015-03-30 ENCOUNTER — Other Ambulatory Visit: Payer: Self-pay | Admitting: Surgery

## 2015-03-30 DIAGNOSIS — K5720 Diverticulitis of large intestine with perforation and abscess without bleeding: Secondary | ICD-10-CM

## 2015-03-30 MED ORDER — HYDROCODONE-ACETAMINOPHEN 5-325 MG PO TABS: 1.0000 | ORAL_TABLET | ORAL | Status: DC | PRN

## 2015-03-30 MED ORDER — METRONIDAZOLE 500 MG PO TABS: 500.0000 mg | ORAL_TABLET | Freq: Three times a day (TID) | ORAL | Status: DC

## 2015-03-30 MED ORDER — CIPROFLOXACIN HCL 500 MG PO TABS: 500.0000 mg | ORAL_TABLET | Freq: Two times a day (BID) | ORAL | Status: DC

## 2015-03-30 MED ORDER — ONDANSETRON HCL 4 MG PO TABS: 4.0000 mg | ORAL_TABLET | Freq: Three times a day (TID) | ORAL | Status: DC | PRN

## 2015-03-30 NOTE — Discharge Summary (Signed)
Physician Discharge Summary  DEAVION STRIDER EGB:[id number] DOB: 10-Jul-1950 DOA: 03/20/2015  PCP: Cathlean Cower, MD  Admit date: 03/20/2015 Discharge date: 03/30/2015  Time spent: greater than 30 minutes  Recommendations for Outpatient Follow-up:  1. Flush drain bid. Home health arranged.  Discharge Diagnoses:  Principal Problem:   Diverticulitis Active Problems:   Abdominal abscess ([place])   Hyponatremia   Essential hypertension   Hypokalemia   Discharge Condition: stable  Diet recommendation: low residue x2 weeks  Filed Weights   03/20/15 2238 03/29/15 0509 03/30/15 0525  Weight: 122.335 kg (269 lb 11.2 oz) 125.42 kg (276 lb 8 oz) 124.8 kg (275 lb 2.2 oz)    History of present illness:  64 y.o. female  Abdominal pain. Started 1 week ago. Getting worse. Comes and goes. Left lower quadrant initially but now more generalized. Associated with watery diarrhea. Denies bloody bowel movements, vomiting, fevers, dysuria, frequency. Pain is worse with meals. Some improvement after trying Dulcolax couple of days ago.  CT showed diverticular abscess.  Hospital Course:  Admitted to medsurg. General surgery consulted. Started on cipro, flagyl, and bowel rest. IR contacted and felt abscess not amenable to percutaneous drainage.  Repeat CT scan showed persistent abscess. IR reconsulted and able to place drain.  By discharge, ambulating, tolerating solids. Will f/u with IR drain clinic and general surgery.  Procedures:  Percutaneous drain placement, pelvic abscess  Consultations:  General surgery  IR  Discharge Exam: Filed Vitals:   03/30/15 0525  BP: 146/73  Pulse: 86  Temp: 98.2 F (36.8 C)  Resp: 17    General: a and o Cardiovascular: RRR without MGR Respiratory: CTA without WRR Abd: s, nt, nd  Discharge Instructions   Discharge Instructions    Activity as tolerated - No restrictions    Complete by:  As directed      Discharge instructions    Complete by:  As  directed   Low fiber diet for 2 weeks, then high fiber. Flush drain twice daily. Change drain dressing as needed for soilage          Current Discharge Medication List    START taking these medications   Details  ciprofloxacin (CIPRO) 500 MG tablet Take 1 tablet (500 mg total) by mouth 2 (two) times daily. Qty: 10 tablet, Refills: 0    HYDROcodone-acetaminophen (NORCO/VICODIN) 5-325 MG tablet Take 1-2 tablets by mouth every 4 (four) hours as needed for moderate pain or severe pain. Qty: 30 tablet, Refills: 0    metroNIDAZOLE (FLAGYL) 500 MG tablet Take 1 tablet (500 mg total) by mouth every 8 (eight) hours. Qty: 15 tablet, Refills: 0    ondansetron (ZOFRAN) 4 MG tablet Take 1 tablet (4 mg total) by mouth every 8 (eight) hours as needed for nausea or vomiting. Qty: 20 tablet, Refills: 0      CONTINUE these medications which have NOT CHANGED   Details  acetaminophen (TYLENOL) 650 MG CR tablet Take 650 mg by mouth every 8 (eight) hours as needed for pain.    aspirin 81 MG EC tablet Take 1 tablet (81 mg total) by mouth daily. Swallow whole. Qty: 30 tablet, Refills: 12    irbesartan-hydrochlorothiazide (AVALIDE) 150-12.5 MG per tablet TAKE 2 TABLETS BY MOUTH ONCE DAILY Qty: 60 tablet, Refills: 6      STOP taking these medications     cephALEXin (KEFLEX) 500 MG capsule      traMADol (ULTRAM) 50 MG tablet        Allergies  Allergen Reactions  . Clarithromycin    Follow-up Information    Follow up with CORNETT,THOMAS A., MD. Schedule an appointment as soon as possible for a visit in 3 weeks.   Specialty:  General Surgery   Contact information:   [address] [place] [address] [phone]       Follow up with radiology drain clinic.   Why:  will call you with appointment       The results of significant diagnostics from this hospitalization (including imaging, microbiology, ancillary and laboratory) are listed below for reference.    Significant  Diagnostic Studies: Dg Abd 1 View  03/25/2015  CLINICAL DATA:  Abdominal pain. Constipation. Recent acute sigmoid diverticulitis. EXAM: ABDOMEN - 1 VIEW COMPARISON:  03/20/2015 CT abdomen/ pelvis. FINDINGS: There are no disproportionately dilated small bowel loops to suggest small bowel obstruction. There is mild-to-moderate stool throughout the colon. No evidence of pneumatosis or pneumoperitoneum. Cholecystectomy clips are seen in the right upper quadrant of the abdomen. Mild reversed S-shaped long segment thoracolumbar scoliosis with associated degenerative changes. IMPRESSION: Nonobstructive bowel gas pattern. Mild to moderate colonic stool volume. Electronically Signed   By: Ilona Sorrel M.D.   On: 03/25/2015 12:12   Ct Abdomen Pelvis W Contrast  03/26/2015  CLINICAL DATA:  Followup diverticulitis.  Abdominal pain. EXAM: CT ABDOMEN AND PELVIS WITH CONTRAST TECHNIQUE: Multidetector CT imaging of the abdomen and pelvis was performed using the standard protocol following bolus administration of intravenous contrast. CONTRAST:  184mL OMNIPAQUE IOHEXOL 300 MG/ML  SOLN COMPARISON:  03/20/2015 FINDINGS: Lung bases are clear.  Old rib fractures on the right. No acute liver finding. Previous cholecystectomy. The spleen is normal. The pancreas is normal. The adrenal glands are normal. The kidneys are normal. The aorta and IVC are normal. Ventral hernia again noted. This primarily contains fat. Presently, a small bit of the transverse colon extends into the hernia, not seen previously. Patient has acute diverticulitis in the sigmoid colon region with surrounding edema. There is a 4.5 cm abscess in the central pelvis which has become slightly more distinct when compared to the study of 6 days ago. 2 x 3 cm low-density in the left pelvic sidewall region is unchanged and more likely to represent a cyst of the left ovary. No bowel obstruction. No new bowel finding. IMPRESSION: Continued evidence of diverticulitis of the  sigmoid colon. 4.5 cm fluid collection in the central pelvis appears more discrete than on the previous study and probably represents an abscess. Electronically Signed   By: Nelson Chimes M.D.   On: 03/26/2015 12:55   Ct Abdomen Pelvis W Contrast  03/20/2015  ADDENDUM REPORT: 03/20/2015 16:15 ADDENDUM: These results were called by telephone at the time of interpretation on 03/20/2015 at 4:14 pm to Dr. Thersa Salt , who verbally acknowledged these results. Electronically Signed   By: Jerilynn Mages.  Shick M.D.   On: 03/20/2015 16:15  03/20/2015  CLINICAL DATA:  Acute lower abdominal and pelvic pain, dysuria, rectal pain, elevated white count. EXAM: CT ABDOMEN AND PELVIS WITH CONTRAST TECHNIQUE: Multidetector CT imaging of the abdomen and pelvis was performed using the standard protocol following bolus administration of intravenous contrast. CONTRAST:  159mL OMNIPAQUE IOHEXOL 300 MG/ML  SOLN COMPARISON:  None. FINDINGS: Lower chest: Healed right lower rib fractures with deformity. Lung bases remain clear. Normal heart size. No pericardial or pleural effusion. Degenerative changes of the lower thoracic spine. Small hiatal hernia evident. Abdomen: Prior cholecystectomy. Small midline fat containing umbilical hernia  with an abdominal defect measuring 4.5 cm, image 43. This is just above the umbilicus. Liver, biliary system, pancreas, spleen, adrenal glands, and kidneys are within normal limits for age and demonstrate no acute process. Negative for bowel obstruction, dilatation, ileus, or significant free air. Retro aortic left renal vein noted, normal variant. Intact aorta. Negative for aneurysm. No acute retroperitoneal process. Pelvis: Diverticulosis present of the colon. Lower sigmoid and rectum demonstrates wall thickening with surrounding edema and inflammation compatible with diverticulitis. Difficult to exclude a mural lesion. Deep within the pelvis, 2 small fluid collections are present 1 the midline measures 39 x 31 mm  posterior to the uterus image 65. Left adnexal small fluid collection versus ovarian cyst measures 30 x 21 mm, image 63. Urinary bladder collapsed. Uterus is tilted to the right. No adenopathy , inguinal abnormality, or inguinal hernia. Degenerative changes of the lower lumbar spine and SI joints. IMPRESSION: Acute diverticulitis of the lower sigmoid colon and rectum. Midline pelvic small fluid collection measuring 4 x 3 cm suspicious for associated abscess Left adnexal small fluid collection versus cystic area along the left ovary could represent a second abscess versus a small ovarian cyst measuring 30 x 21 mm. No associated obstruction or ileus Small fat containing ventral hernia superior to the umbilicus. Prior cholecystectomy Small hiatal hernia Electronically Signed: By: Jerilynn Mages.  Shick M.D. On: 03/20/2015 15:55   Ct Image Guided Drainage Percut Cath  Peritoneal Retroperit  03/28/2015  CLINICAL DATA:  Pelvic diverticular abscess requiring percutaneous drainage. EXAM: CT GUIDED DRAINAGE OF PERITONEAL DIVERTICULAR ABSCESS ANESTHESIA/SEDATION: 2.0 Mg IV Versed 100 mcg IV Fentanyl Total Moderate Sedation Time:  40 minutes PROCEDURE: The procedure, risks, benefits, and alternatives were explained to the patient. Questions regarding the procedure were encouraged and answered. The patient understands and consents to the procedure. A time-out was performed prior to the procedure. The right gluteal region was prepped with Betadine in a sterile fashion, and a sterile drape was applied covering the operative field. A sterile gown and sterile gloves were used for the procedure. Local anesthesia was provided with 1% Lidocaine. CT was performed in a prone position. Under CT guidance, an 18 gauge trocar needle was advanced from a right transgluteal approach into the pelvis. After determining needle tip position, aspiration of fluid was performed and a sample obtained for culture analysis. A guidewire was advanced into the  collection. The tract was dilated and a 12 French percutaneous drainage catheter placed. This catheter was flushed with saline and connected to a suction bulb. The catheter was secured at the skin with a Prolene retention suture and StatLock device. COMPLICATIONS: None FINDINGS: After advancing a needle into the diverticular abscess of the central pelvis, aspiration yielded grossly purulent fluid. A sample was sent for culture analysis. A 12 French drain was placed. IMPRESSION: CT-guided drainage of peritoneal diverticular abscess located in the central pelvis. A 12 French catheter was placed and connected to suction bulb drainage. Electronically Signed   By: Aletta Edouard M.D.   On: 03/28/2015 16:38    Microbiology: Recent Results (from the past 240 hour(s))  Culture, routine-abscess     Status: None (Preliminary result)   Collection Time: 03/28/15  3:30 PM  Result Value Ref Range Status   Specimen Description ABSCESS ABDOMEN  Final   Special Requests Normal  Final   Gram Stain   Final    FEW WBC PRESENT, PREDOMINANTLY PMN NO SQUAMOUS EPITHELIAL CELLS SEEN MODERATE GRAM POSITIVE COCCI IN PAIRS Performed at [facility]    Culture   Final    Culture reincubated for better growth Performed at [facility]    Report Status PENDING  Incomplete  Anaerobic culture     Status: None (Preliminary result)   Collection Time: 03/28/15  3:30 PM  Result Value Ref Range Status   Specimen Description ABSCESS ABDOMEN  Final   Special Requests Normal  Final   Gram Stain   Final    FEW WBC PRESENT, PREDOMINANTLY PMN NO SQUAMOUS EPITHELIAL CELLS SEEN MODERATE GRAM POSITIVE COCCI IN PAIRS Performed at [facility]    Culture PENDING  Incomplete   Report Status PENDING  Incomplete     Labs: Basic Metabolic Panel:  Recent Labs Lab 03/25/15 0933 03/25/15 1055 03/26/15 0806 03/27/15 0412 03/28/15 0423 03/29/15 0555  NA 135  --  136 139 139 136  K 2.9*  --  3.9 4.4  4.5 4.6  CL 97*  --  97* 101 102 96*  CO2 31  --  33* 33* 31 27  GLUCOSE 98  --  93 88 85 91  BUN <5*  --  <5* <5* <5* <5*  CREATININE 0.74  --  0.82 0.93 0.93 1.03*  CALCIUM 8.0*  --  8.1* 8.3* 8.6* 8.7*  MG  --  1.7  --   --   --   --    Liver Function Tests: No results for input(s): AST, ALT, ALKPHOS, BILITOT, PROT, ALBUMIN in the last 168 hours. No results for input(s): LIPASE, AMYLASE in the last 168 hours. No results for input(s): AMMONIA in the last 168 hours. CBC:  Recent Labs Lab 03/25/15 0410 03/26/15 0806 03/27/15 0412 03/28/15 0423 03/29/15 0555  WBC 10.1 8.7 10.9* 11.5* 13.6*  HGB 10.3* 10.9* 10.4* 10.8* 11.4*  HCT 31.5* 34.2* 33.3* 33.7* 36.2  MCV 88.2 89.3 90.0 90.6 91.9  PLT 280 301 308 317 338   Cardiac Enzymes: No results for input(s): CKTOTAL, CKMB, CKMBINDEX, TROPONINI in the last 168 hours. BNP: BNP (last 3 results) No results for input(s): BNP in the last 8760 hours.  ProBNP (last 3 results) No results for input(s): PROBNP in the last 8760 hours.  CBG: No results for input(s): GLUCAP in the last 168 hours.     SignedDelfina Redwood  [facility] 03/30/2015, 10:18 AM

## 2015-03-30 NOTE — Progress Notes (Signed)
DC home with husband, verbally understood DC instructions. Instructed on drain care and supplies sent with pt.Veronica [name]

## 2015-03-30 NOTE — Progress Notes (Signed)
To whom it may concern:   Ms. Veronica [name] is unable to work from 03/20/15 through 04/06/15 due to illness.   Sincerely,    Doree Barthel, MD [facility]

## 2015-03-31 NOTE — Evaluation (Signed)
Physical Therapy Evaluation Patient Details Name: Veronica [name] MRN: [id number] DOB: Mar 01, 1951 Today's Date: 03/31/2015   History of Present Illness  64 y.o. female admitted to [facility] on 03/20/15 for abdominal pain. Pt dx with acute sigmoid diverticulitis with abscess.  Pt s/p drain placement.  Pt with significant PMHx of HTN, diverticulitis, and right shoulder surgery.     Clinical Impression  Pt is able to walk at baseline level despite pain from drain.  She demonstrated safety on stairs simulating home entry and ambulating with a cane household distances.  She has no further acute or f/u PT needs at this time.  PT to sign off.     Follow Up Recommendations No PT follow up    Equipment Recommendations  None recommended by PT    Recommendations for Other Services   NA    Precautions / Restrictions   drain right side      Mobility  Bed Mobility               General bed mobility comments: per pt report it hurts to get out of the bed on the right (her usual side at home) due to that is where the drain and abscess are located.   Transfers Overall transfer level: Needs assistance Equipment used: Straight cane Transfers: Sit to/from Stand Sit to Stand: Supervision         General transfer comment: supervision for safety  Ambulation/Gait Ambulation/Gait assistance: Supervision Ambulation Distance (Feet): 120 Feet Assistive device: Straight cane Gait Pattern/deviations: Step-through pattern;Wide base of support Gait velocity: decreased Gait velocity interpretation: Below normal speed for age/gender General Gait Details: pt with slow, but steady gait using the cane.  She holds it in her right hand and is safe with it's use during gait.   Stairs Stairs: Yes Stairs assistance: Supervision Stair Management: One rail Left;Step to pattern;Sideways Number of Stairs: 5 General stair comments: Pt truned sideways to have both hands on railing for support.  She reports  she doesn't usually have to have both hands, but due to pain she felt more secure this way.  She was safe with her techniuew only requiring supervision for safety.          Balance Overall balance assessment: Needs assistance Sitting-balance support: Feet supported;No upper extremity supported Sitting balance-Leahy Scale: Good     Standing balance support: Bilateral upper extremity supported;No upper extremity supported;Single extremity supported Standing balance-Leahy Scale: Good                               Pertinent Vitals/Pain Pain Assessment: Faces Faces Pain Scale: Hurts little more Pain Location: at drain site Pain Descriptors / Indicators: Aching;Burning Pain Intervention(s): Limited activity within patient's tolerance;Monitored during session;Repositioned    Home Living Family/patient expects to be discharged to:: Private residence Living Arrangements: Spouse/significant other Available Help at Discharge: Family;Available 24 hours/day Type of Home: House Home Access: Stairs to enter Entrance Stairs-Rails: Left Entrance Stairs-Number of Steps: 5 Home Layout: One level Home Equipment: Cane - single point      Prior Function Level of Independence: Independent with assistive device(s)         Comments: pt furniture walks in the house, uses the cane in the community, reports no h/o falls     Hand Dominance   Dominant Hand: Right    Extremity/Trunk Assessment   Upper Extremity Assessment: Overall WFL for tasks assessed  Lower Extremity Assessment: Generalized weakness      Cervical / Trunk Assessment: Normal  Communication   Communication: No difficulties  Cognition Arousal/Alertness: Awake/alert Behavior During Therapy: WFL for tasks assessed/performed Overall Cognitive Status: Within Functional Limits for tasks assessed                               Assessment/Plan    PT Assessment Patent does not need  any further PT services  PT Diagnosis Difficulty walking;Abnormality of gait;Generalized weakness;Acute pain         PT Goals (Current goals can be found in the Care Plan section) Acute Rehab PT Goals Patient Stated Goal: to go home today if she is able PT Goal Formulation: All assessment and education complete, DC therapy               End of Session   Activity Tolerance: Patient limited by pain Patient left: in chair;with call bell/phone within reach Nurse Communication: Mobility status         Time: 1007-1219 PT Time Calculation (min) (ACUTE ONLY): 17 min   Charges:  1 EV            Manuela Halbur B. [place], [place], DPT [phone]   03/31/2015, 6:36 PM

## 2015-04-01 LAB — CULTURE, ROUTINE-ABSCESS: Special Requests: NORMAL

## 2015-04-02 LAB — ANAEROBIC CULTURE: Special Requests: NORMAL

## 2015-04-06 ENCOUNTER — Ambulatory Visit
Admission: RE | Admit: 2015-04-06 | Discharge: 2015-04-06 | Disposition: A | Discharge status: Home / Self Care | Payer: BC Managed Care – PPO | Source: Ambulatory Visit | Attending: Radiology | Admitting: Radiology

## 2015-04-06 ENCOUNTER — Ambulatory Visit
Admission: RE | Admit: 2015-04-06 | Discharge: 2015-04-06 | Disposition: A | Discharge status: Home / Self Care | Payer: BC Managed Care – PPO | Source: Ambulatory Visit | Attending: Surgery | Admitting: Surgery

## 2015-04-06 DIAGNOSIS — K5720 Diverticulitis of large intestine with perforation and abscess without bleeding: Secondary | ICD-10-CM

## 2015-04-06 MED ORDER — IOPAMIDOL (ISOVUE-300) INJECTION 61%
125.0000 mL | Freq: Once | INTRAVENOUS | Status: AC | PRN
  Administered 2015-04-06: 125 mL via INTRAVENOUS

## 2015-04-06 NOTE — Progress Notes (Signed)
Referring Physician(s): Dr Brantley Stage  Chief Complaint: The patient is seen in follow up today s/p 03/28/15 CT-guided drainage of peritoneal diverticular abscess located in the central pelvis. A 12 French catheter was placed and connected to suction bulb drainage.  History of present illness:  Today scheduled for follow up of drain to include drain injection with possible removal. Pt states she has minimal tenderness at site. Denies fever or chills Flushes drain 2x/daily. Output almost nothing x 2-3 days. Does not have follow up with Dr Brantley Stage yet scheduled.    Past Medical History  Diagnosis Date  . Broken ribs     hx of  . Hemorrhoids   . Allergy   . Arthritis   . GERD (gastroesophageal reflux disease)   . Hypertension   . Carpal tunnel syndrome 12/27/2010  . Diverticulitis     Past Surgical History  Procedure Laterality Date  . Cholecystectomy  2000  . Shoulder surgery Right 1999    Allergies: Clarithromycin  Medications: Prior to Admission medications   Medication Sig Start Date End Date Taking? Authorizing Provider  acetaminophen (TYLENOL) 650 MG CR tablet Take 650 mg by mouth every 8 (eight) hours as needed for pain.    Historical Provider, MD  aspirin 81 MG EC tablet Take 1 tablet (81 mg total) by mouth daily. Swallow whole. Patient taking differently: Take 81 mg by mouth every 14 (fourteen) days. Swallow whole. 11/27/12   Biagio Borg, MD  ciprofloxacin (CIPRO) 500 MG tablet Take 1 tablet (500 mg total) by mouth 2 (two) times daily. 03/30/15   Delfina Redwood, MD  HYDROcodone-acetaminophen (NORCO/VICODIN) 5-325 MG tablet Take 1-2 tablets by mouth every 4 (four) hours as needed for moderate pain or severe pain. 03/30/15   Delfina Redwood, MD  irbesartan-hydrochlorothiazide (AVALIDE) 150-12.5 MG per tablet TAKE 2 TABLETS BY MOUTH ONCE DAILY 11/30/13   Biagio Borg, MD  metroNIDAZOLE (FLAGYL) 500 MG tablet Take 1 tablet (500 mg total) by mouth every 8  (eight) hours. 03/30/15   Delfina Redwood, MD  ondansetron (ZOFRAN) 4 MG tablet Take 1 tablet (4 mg total) by mouth every 8 (eight) hours as needed for nausea or vomiting. 03/30/15   Delfina Redwood, MD     Family History  Problem Relation Age of Onset  . Arthritis Other   . Alcohol abuse Other   . Heart disease Other   . Hypertension Other   . Cancer Other   . Heart disease Other     Social History   Social History  . Marital Status: Married    Spouse Name: N/A  . Number of Children: N/A  . Years of Education: 16   Occupational History  . Aministrative support [employer]   Social History Main Topics  . Smoking status: Never Smoker   . Smokeless tobacco: Never Used  . Alcohol Use: No  . Drug Use: No  . Sexual Activity: Not on file   Other Topics Concern  . Not on file   Social History Narrative     Vital Signs: BP 137/68 mmHg  Pulse 97  Temp(Src) 98.1 F (36.7 C)  SpO2 97%  Physical Exam  Abdominal: Soft. Bowel sounds are normal.  Skin: Skin is warm.  Site of TG abscess drain is NT No bleeding No sign of infection Output serous color in JP Less than 10 cc in JP Cx: + cocci; mult organisms   Nursing note and vitals reviewed.  Imaging: Ct Abdomen Pelvis W Contrast  04/06/2015  CLINICAL DATA:  Status post CT-guided percutaneous drainage of pelvic diverticular abscess on 03/28/2015. The patient is now following up as an outpatient in the [facility] for [facility] evaluation as well as possible fluoroscopic drain injection. EXAM: CT ABDOMEN AND PELVIS WITH CONTRAST TECHNIQUE: Multidetector CT imaging of the abdomen and pelvis was performed using the standard protocol following bolus administration of intravenous contrast. CONTRAST:  116mL ISOVUE-300 IOPAMIDOL (ISOVUE-300) INJECTION 61% COMPARISON:  03/28/2015 and 03/26/2015 FINDINGS: The right-sided transgluteal drainage catheter is stable in position within the central and posterior aspect of the  pelvis immediately posterior to the uterus and adjacent to the distal sigmoid colon. Previously identified focal abscess has completely resolved with no residual fluid collection remaining. No new abscess is identified. No extraluminal air is seen in the peritoneal cavity. Bowel loops are decompressed and show no evidence of obstruction or ileus. No inflammatory changes are identified throughout the peritoneal cavity. Contrast-enhanced appearance of the liver, spleen, pancreas, adrenal glands and kidneys are unremarkable. There is a stable appearance to a small hiatal hernia as well as a ventral hernia containing fat. No masses or lymphadenopathy identified. No vascular abnormalities. Visualized lung bases and bony structures appear stable. IMPRESSION: Resolution of pelvic diverticular abscess after percutaneous drainage. No new abscess or inflammatory process identified. Following the CT, the patient was clinically assessed and the drainage catheter injected under fluoroscopy. Please see separately dictated fluoroscopic procedure report as well as separate Epic progress note. Electronically Signed   By: Aletta Edouard M.D.   On: 04/06/2015 09:19    Labs:  CBC:  Recent Labs  03/26/15 0806 03/27/15 0412 03/28/15 0423 03/29/15 0555  WBC 8.7 10.9* 11.5* 13.6*  HGB 10.9* 10.4* 10.8* 11.4*  HCT 34.2* 33.3* 33.7* 36.2  PLT 301 308 317 338    COAGS:  Recent Labs  03/21/15 0515 03/28/15 0423  INR 1.32 1.23  APTT 29 31    BMP:  Recent Labs  03/26/15 0806 03/27/15 0412 03/28/15 0423 03/29/15 0555  NA 136 139 139 136  K 3.9 4.4 4.5 4.6  CL 97* 101 102 96*  CO2 33* 33* 31 27  GLUCOSE 93 88 85 91  BUN <5* <5* <5* <5*  CALCIUM 8.1* 8.3* 8.6* 8.7*  CREATININE 0.82 0.93 0.93 1.03*  GFRNONAA >60 >60 >60 56*  GFRAA >60 >60 >60 >60    LIVER FUNCTION TESTS:  Recent Labs  12/13/14 0929 03/20/15 1315 03/20/15 1732  BILITOT 1.3* 1.6* 1.5*  AST 15 25 23   ALT 13 22 21   ALKPHOS 80  81 82  PROT 7.6 8.1 7.7  ALBUMIN 3.6 3.1* 3.0*    Assessment:  Pelvic diverticular abscess drain placed 03/28/15 Has done well with drain Minimal output few days afeb CT shows resolution of abscess Injection proves NO communication Removed per Dr Kathlene Cote order Clean dressing applied Pt to follow up with Dr Brantley Stage  Signed: Monia Sabal A 04/06/2015, 9:26 AM   Please refer to Dr. Kathlene Cote attestation of this note for management and plan.

## 2015-04-14 ENCOUNTER — Other Ambulatory Visit: Payer: Self-pay | Admitting: Internal Medicine

## 2015-05-04 ENCOUNTER — Other Ambulatory Visit: Payer: Self-pay | Admitting: Internal Medicine

## 2015-12-13 ENCOUNTER — Other Ambulatory Visit (INDEPENDENT_AMBULATORY_CARE_PROVIDER_SITE_OTHER): Payer: BC Managed Care – PPO

## 2015-12-13 DIAGNOSIS — Z0000 Encounter for general adult medical examination without abnormal findings: Secondary | ICD-10-CM

## 2015-12-13 LAB — HEPATIC FUNCTION PANEL
ALT: 9 U/L (ref 0–35)
AST: 14 U/L (ref 0–37)
Albumin: 3.6 g/dL (ref 3.5–5.2)
Alkaline Phosphatase: 83 U/L (ref 39–117)
Bilirubin, Direct: 0.1 mg/dL (ref 0.0–0.3)
Total Bilirubin: 0.8 mg/dL (ref 0.2–1.2)
Total Protein: 7.7 g/dL (ref 6.0–8.3)

## 2015-12-13 LAB — LIPID PANEL
Cholesterol: 147 mg/dL (ref 0–200)
HDL: 45.8 mg/dL (ref >39.00)
LDL Cholesterol: 90 mg/dL (ref 0–99)
NonHDL: 101.06
Total CHOL/HDL Ratio: 3
Triglycerides: 56 mg/dL (ref 0.0–149.0)
VLDL: 11.2 mg/dL (ref 0.0–40.0)

## 2015-12-13 LAB — CBC WITH DIFFERENTIAL/PLATELET
Basophils Absolute: 0 10*3/uL (ref 0.0–0.1)
Basophils Relative: 0.6 % (ref 0.0–3.0)
Eosinophils Absolute: 0.6 10*3/uL (ref 0.0–0.7)
Eosinophils Relative: 7 % — ABNORMAL HIGH (ref 0.0–5.0)
HCT: 38.7 % (ref 36.0–46.0)
Hemoglobin: 12.9 g/dL (ref 12.0–15.0)
Lymphocytes Relative: 27.8 % (ref 12.0–46.0)
Lymphs Abs: 2.2 10*3/uL (ref 0.7–4.0)
MCHC: 33.5 g/dL (ref 30.0–36.0)
MCV: 89.2 fl (ref 78.0–100.0)
Monocytes Absolute: 0.5 10*3/uL (ref 0.1–1.0)
Monocytes Relative: 5.9 % (ref 3.0–12.0)
Neutro Abs: 4.7 10*3/uL (ref 1.4–7.7)
Neutrophils Relative %: 58.7 % (ref 43.0–77.0)
Platelets: 235 10*3/uL (ref 150.0–400.0)
RBC: 4.34 Mil/uL (ref 3.87–5.11)
RDW: 14.3 % (ref 11.5–15.5)
WBC: 8 10*3/uL (ref 4.0–10.5)

## 2015-12-13 LAB — BASIC METABOLIC PANEL
BUN: 18 mg/dL (ref 6–23)
CO2: 29 mEq/L (ref 19–32)
Calcium: 9.2 mg/dL (ref 8.4–10.5)
Chloride: 102 mEq/L (ref 96–112)
Creatinine, Ser: 0.9 mg/dL (ref 0.40–1.20)
GFR: 80.42 mL/min (ref >60.00)
Glucose, Bld: 114 mg/dL — ABNORMAL HIGH (ref 70–99)
Potassium: 4.5 mEq/L (ref 3.5–5.1)
Sodium: 138 mEq/L (ref 135–145)

## 2015-12-13 LAB — TSH: TSH: 4.88 u[IU]/mL — ABNORMAL HIGH (ref 0.35–4.50)

## 2015-12-14 ENCOUNTER — Ambulatory Visit (INDEPENDENT_AMBULATORY_CARE_PROVIDER_SITE_OTHER): Payer: BC Managed Care – PPO | Admitting: Internal Medicine

## 2015-12-14 ENCOUNTER — Encounter: Payer: Self-pay | Admitting: Internal Medicine

## 2015-12-14 ENCOUNTER — Other Ambulatory Visit: Payer: BC Managed Care – PPO

## 2015-12-14 VITALS — BP 126/78 | HR 91 | Temp 98.0°F | Resp 20 | Wt 262.0 lb

## 2015-12-14 DIAGNOSIS — I10 Essential (primary) hypertension: Secondary | ICD-10-CM

## 2015-12-14 DIAGNOSIS — Z0000 Encounter for general adult medical examination without abnormal findings: Secondary | ICD-10-CM

## 2015-12-14 DIAGNOSIS — Z1159 Encounter for screening for other viral diseases: Secondary | ICD-10-CM

## 2015-12-14 DIAGNOSIS — R6889 Other general symptoms and signs: Secondary | ICD-10-CM

## 2015-12-14 DIAGNOSIS — M25562 Pain in left knee: Secondary | ICD-10-CM

## 2015-12-14 DIAGNOSIS — M25561 Pain in right knee: Secondary | ICD-10-CM | POA: Diagnosis not present

## 2015-12-14 DIAGNOSIS — Z23 Encounter for immunization: Secondary | ICD-10-CM | POA: Diagnosis not present

## 2015-12-14 DIAGNOSIS — Z0001 Encounter for general adult medical examination with abnormal findings: Secondary | ICD-10-CM

## 2015-12-14 LAB — URINALYSIS, ROUTINE W REFLEX MICROSCOPIC
Bilirubin Urine: NEGATIVE
Hgb urine dipstick: NEGATIVE
Ketones, ur: NEGATIVE
Leukocytes, UA: NEGATIVE
Nitrite: NEGATIVE
RBC / HPF: NONE SEEN (ref 0–?)
Specific Gravity, Urine: 1.01 (ref 1.000–1.030)
Total Protein, Urine: NEGATIVE
Urine Glucose: NEGATIVE
Urobilinogen, UA: 0.2 (ref 0.0–1.0)
WBC, UA: NONE SEEN (ref 0–?)
pH: 6 (ref 5.0–8.0)

## 2015-12-14 MED ORDER — TRAMADOL HCL 50 MG PO TABS: 50.0000 mg | ORAL_TABLET | Freq: Three times a day (TID) | ORAL | Status: DC | PRN

## 2015-12-14 NOTE — Progress Notes (Signed)
Pre visit review using our clinic review tool, if applicable. No additional management support is needed unless otherwise documented below in the visit note. 

## 2015-12-14 NOTE — Progress Notes (Signed)
Subjective:    Patient ID: Veronica [name], female    DOB: [birth year], 65 y.o.   MRN: [id number]  HPI Here for wellness and f/u;  Overall doing ok;   Pt denies neurological change such as new headache, facial or extremity weakness.  Pt denies polydipsia, polyuria, or low sugar symptoms. Pt states overall good compliance with treatment and medications, good tolerability, and has been trying to follow appropriate diet.  Pt denies worsening depressive symptoms, suicidal ideation or panic. No fever, night sweats, wt loss, loss of appetite, or other constitutional symptoms.  Pt states good ability with ADL's, has low fall risk, home safety reviewed and adequate, no other significant changes in hearing or vision, and only occasionally active with exercise. Lost 14 lbs with better diet alone, Plans to try to be more active Wt Readings from Last 3 Encounters:  12/14/15 262 lb (118.842 kg)  03/30/15 275 lb 2.2 oz (124.8 kg)  03/20/15 267 lb (121.11 kg)   Has ongoing severe "bone on bone' pain to knees, has been seeing Dr Tamala Julian with some improvement, but pain now severe, ran out of tramadol x 1 mo, tylenol not helping. Has been losing wt but pain persists,  Has been holding off on consider ortho for surgury since several friends told her they did not do well.  Pt denies Chest pain, worsening SOB, DOE, wheezing, orthopnea, PND, worsening LE edema, palpitations, dizziness or syncope. Denies hyper or hypo thyroid symptoms such as voice, skin or hair change. Past Medical History  Diagnosis Date  . Broken ribs     hx of  . Hemorrhoids   . Allergy   . Arthritis   . GERD (gastroesophageal reflux disease)   . Hypertension   . Carpal tunnel syndrome 12/27/2010  . Diverticulitis    Past Surgical History  Procedure Laterality Date  . Cholecystectomy  2000  . Shoulder surgery Right 1999    reports that she has never smoked. She has never used smokeless tobacco. She reports that she does not drink alcohol  or use illicit drugs. family history includes Alcohol abuse in her other; Arthritis in her other; Cancer in her other; Heart disease in her other and other; Hypertension in her other. Allergies  Allergen Reactions  . Clarithromycin    Current Outpatient Prescriptions on File Prior to Visit  Medication Sig Dispense Refill  . acetaminophen (TYLENOL) 650 MG CR tablet Take 650 mg by mouth every 8 (eight) hours as needed for pain.    Marland Kitchen aspirin 81 MG EC tablet Take 1 tablet (81 mg total) by mouth daily. Swallow whole. (Patient taking differently: Take 81 mg by mouth every 14 (fourteen) days. Swallow whole.) 30 tablet 12  . irbesartan-hydrochlorothiazide (AVALIDE) 150-12.5 MG tablet take 2 tablets by mouth once daily 180 tablet 2   No current facility-administered medications on file prior to visit.   Review of Systems Constitutional: Negative for increased diaphoresis, or other activity, appetite or siginficant weight change other than noted HENT: Negative for worsening hearing loss, ear pain, facial swelling, mouth sores and neck stiffness.   Eyes: Negative for other worsening pain, redness or visual disturbance.  Respiratory: Negative for choking or stridor Cardiovascular: Negative for other chest pain and palpitations.  Gastrointestinal: Negative for worsening diarrhea, blood in stool, or abdominal distention Genitourinary: Negative for hematuria, flank pain or change in urine volume.  Musculoskeletal: Negative for myalgias or other joint complaints.  Skin: Negative for other color change and wound or drainage.  Neurological:  Negative for syncope and numbness. other than noted Hematological: Negative for adenopathy. or other swelling Psychiatric/Behavioral: Negative for hallucinations, SI, self-injury, decreased concentration or other worsening agitation.      Objective:   Physical Exam BP 126/78 mmHg  Pulse 91  Temp(Src) 98 F (36.7 C) (Oral)  Resp 20  Wt 262 lb (118.842 kg)  SpO2  96% VS noted, morbid obese Constitutional: Pt is oriented to person, place, and time. Appears well-developed and well-nourished, in no significant distress Head: Normocephalic and atraumatic  Eyes: Conjunctivae and EOM are normal. Pupils are equal, round, and reactive to light Right Ear: External ear normal.  Left Ear: External ear normal Nose: Nose normal.  Mouth/Throat: Oropharynx is clear and moist  Neck: Normal range of motion. Neck supple. No JVD present. No tracheal deviation present or significant neck LA or mass Cardiovascular: Normal rate, regular rhythm, normal heart sounds and intact distal pulses.   Pulmonary/Chest: Effort normal and breath sounds without rales or wheezing  Abdominal: Soft. Bowel sounds are normal. NT. No HSM  Musculoskeletal: Normal range of motion. Exhibits no edema Lymphadenopathy: Has no cervical adenopathy.  Neurological: Pt is alert and oriented to person, place, and time. Pt has normal reflexes. No cranial nerve deficit. Motor grossly intact Skin: Skin is warm and dry. No rash noted or new ulcers Psychiatric:  Has normal mood and affect. Behavior is normal.  Bilat knees with crepitus, decreased ROM, no effusions    Assessment & Plan:

## 2015-12-14 NOTE — Patient Instructions (Addendum)
You had the Pneumovax shot today  Please continue all other medications as before, including restarting the tramadol  Please have the pharmacy call with any other refills you may need.  Please continue your efforts at being more active, low cholesterol diet, and weight control.  You are otherwise up to date with prevention measures today.  Please keep your appointments with your specialists as you may have planned  Your lab work was done yesterday; we will try to get results  You will be contacted by phone if any changes need to be made immediately.  Otherwise, you will receive a letter about your results with an explanation, but please check with MyChart first.  Please remember to sign up for MyChart if you have not done so, as this will be important to you in the future with finding out test results, communicating by private email, and scheduling acute appointments online when needed.  Please return in 1 year for your yearly visit, or sooner if needed, with Lab testing done 3-5 days before

## 2015-12-16 NOTE — Assessment & Plan Note (Signed)
stable overall by history and exam, recent data reviewed with pt, and pt to continue medical treatment as before,  to f/u any worsening symptoms or concerns BP Readings from Last 3 Encounters:  12/14/15 126/78  04/06/15 137/68  03/30/15 146/73

## 2015-12-16 NOTE — Assessment & Plan Note (Signed)

## 2015-12-16 NOTE — Assessment & Plan Note (Addendum)
Secondary to OA, for tramadol restart, tof/u with Dr Smith/sports med in this office  In addition to the time spent performing CPE, I spent an additional 15 minutes face to face,in which greater than 50% of this time was spent in counseling and coordination of care for patient's illness as documented.

## 2016-01-18 ENCOUNTER — Telehealth: Payer: Self-pay | Admitting: Emergency Medicine

## 2016-01-18 NOTE — Telephone Encounter (Signed)
Pt called and needs a prescription refill on irbesartan-hydrochlorothiazide (AVALIDE) 150-12.5 MG tablet. Pharmacy is [facility]- [place] Please follow up thanks.

## 2016-01-19 ENCOUNTER — Other Ambulatory Visit: Payer: Self-pay

## 2016-01-19 MED ORDER — IRBESARTAN-HYDROCHLOROTHIAZIDE 150-12.5 MG PO TABS: 2.0000 | ORAL_TABLET | Freq: Every day | ORAL | 2 refills | Status: DC

## 2016-01-19 NOTE — Telephone Encounter (Signed)
rx sent to pharm

## 2016-08-01 ENCOUNTER — Telehealth: Payer: BC Managed Care – PPO | Admitting: Nurse Practitioner

## 2016-08-01 DIAGNOSIS — B9789 Other viral agents as the cause of diseases classified elsewhere: Secondary | ICD-10-CM

## 2016-08-01 DIAGNOSIS — J329 Chronic sinusitis, unspecified: Secondary | ICD-10-CM

## 2016-08-01 MED ORDER — FLUTICASONE PROPIONATE 50 MCG/ACT NA SUSP: 2.0000 | Freq: Every day | NASAL | 0 refills | Status: DC

## 2016-08-01 NOTE — Progress Notes (Signed)

## 2016-08-02 ENCOUNTER — Other Ambulatory Visit: Payer: Self-pay | Admitting: Internal Medicine

## 2016-08-05 NOTE — Telephone Encounter (Signed)
Last OV and refill 12/14/15 with 5 refills, please advise.

## 2016-08-06 NOTE — Telephone Encounter (Signed)
Done hardcopy to anna 

## 2016-08-06 NOTE — Telephone Encounter (Signed)
RX faxed to [facility] 

## 2016-10-01 ENCOUNTER — Ambulatory Visit (INDEPENDENT_AMBULATORY_CARE_PROVIDER_SITE_OTHER): Payer: BC Managed Care – PPO | Admitting: Internal Medicine

## 2016-10-01 ENCOUNTER — Encounter: Payer: Self-pay | Admitting: Internal Medicine

## 2016-10-01 VITALS — BP 112/78 | HR 94 | Ht 66.0 in | Wt 273.0 lb

## 2016-10-01 DIAGNOSIS — G5603 Carpal tunnel syndrome, bilateral upper limbs: Secondary | ICD-10-CM | POA: Diagnosis not present

## 2016-10-01 DIAGNOSIS — M542 Cervicalgia: Secondary | ICD-10-CM | POA: Diagnosis not present

## 2016-10-01 DIAGNOSIS — G8929 Other chronic pain: Secondary | ICD-10-CM

## 2016-10-01 DIAGNOSIS — R220 Localized swelling, mass and lump, head: Secondary | ICD-10-CM | POA: Diagnosis not present

## 2016-10-01 DIAGNOSIS — J309 Allergic rhinitis, unspecified: Secondary | ICD-10-CM

## 2016-10-01 MED ORDER — TRAMADOL HCL 50 MG PO TABS: ORAL_TABLET | ORAL | 5 refills | Status: DC

## 2016-10-01 NOTE — Assessment & Plan Note (Signed)
Stable, for tramadol refill,  to f/u any worsening symptoms or concerns

## 2016-10-01 NOTE — Assessment & Plan Note (Signed)
Ok for ENT referral,  to f/u any worsening symptoms or concerns 

## 2016-10-01 NOTE — Patient Instructions (Addendum)
Please obtain and wear right and left wrist splints (from the pharmacy) at night only   Please call if not improved in 1-2 wks for neurology referral  Please continue all other medications as before including the nasal spray, and refills have been done for the tramadol  Please have the pharmacy call with any other refills you may need.  Please keep your appointments with your specialists as you may have planned  You will be contacted regarding the referral for: ENT for the forehead swelling  Please return in December 19, 2016, or sooner if needed, with Lab testing done 3-5 days before

## 2016-10-01 NOTE — Assessment & Plan Note (Signed)
Improved, cont same tx 

## 2016-10-01 NOTE — Progress Notes (Signed)
Pre visit review using our clinic review tool, if applicable. No additional management support is needed unless otherwise documented below in the visit note. 

## 2016-10-01 NOTE — Progress Notes (Signed)
Subjective:    Patient ID: Veronica [name], female    DOB: 1950-07-18, 66 y.o.   MRN: [id number]  HPI  Here to f/u-   Has bilat hand numb at night but also during the day woith holding objects, more severe x 2-3 wks, had some twinges prior;  Works on [employer] , but states had CTS and peripheral neuropathy testing a few yrs ago neg.   Right wrist splint may have helped at that time, has not tried yet this time. No loss of grip strength.  Has ongoing recurrent neck pain, has topical pain reliever OTC she uses on the neck as well, and helps.  Asks for tramadol for the chronic pain, also other joint arhtirits.   Does have several wks ongoing nasal allergy symptoms with clearish congestion, itch and sneezing, without fever, pain, ST, cough, swelling or wheezing. Is somewhat better after recent evisit and asked to take nasal steroid.   Does also have a sub q nodular mass increaseing in size x 1-2 mo to the glabellar region of the forehead, asks for ENT referral for removal.     Past Medical History:  Diagnosis Date  . Allergy   . Arthritis   . Broken ribs    hx of  . Carpal tunnel syndrome 12/27/2010  . Diverticulitis   . GERD (gastroesophageal reflux disease)   . Hemorrhoids   . Hypertension    Past Surgical History:  Procedure Laterality Date  . CHOLECYSTECTOMY  2000  . SHOULDER SURGERY Right 1999    reports that she has never smoked. She has never used smokeless tobacco. She reports that she does not drink alcohol or use drugs. family history includes Alcohol abuse in her other; Arthritis in her other; Cancer in her other; Heart disease in her other and other; Hypertension in her other. Allergies  Allergen Reactions  . Clarithromycin    Current Outpatient Prescriptions on File Prior to Visit  Medication Sig Dispense Refill  . acetaminophen (TYLENOL) 650 MG CR tablet Take 650 mg by mouth every 8 (eight) hours as needed for pain.    Marland Kitchen aspirin 81 MG EC tablet Take 1 tablet (81 mg  total) by mouth daily. Swallow whole. (Patient taking differently: Take 81 mg by mouth every 14 (fourteen) days. Swallow whole.) 30 tablet 12  . irbesartan-hydrochlorothiazide (AVALIDE) 150-12.5 MG tablet Take 2 tablets by mouth daily. 180 tablet 2  . traMADol (ULTRAM) 50 MG tablet take 1 tablet by mouth every 8 hours if needed 120 tablet 5  . fluticasone (FLONASE) 50 MCG/ACT nasal spray Place 2 sprays into both nostrils daily. 16 g 0   No current facility-administered medications on file prior to visit.     Review of Systems  Constitutional: Negative for other unusual diaphoresis or sweats HENT: Negative for ear discharge or swelling Eyes: Negative for other worsening visual disturbances Respiratory: Negative for stridor or other swelling  Gastrointestinal: Negative for worsening distension or other blood Genitourinary: Negative for retention or other urinary change Musculoskeletal: Negative for other MSK pain or swelling Skin: Negative for color change or other new lesions Neurological: Negative for worsening tremors and other numbness  Psychiatric/Behavioral: Negative for worsening agitation or other fatigue     Objective:   Physical Exam BP 112/78   Pulse 94   Ht 5\' 6"  (1.676 m)   Wt 273 lb (123.8 kg)   SpO2 97%   BMI 44.06 kg/m  VS noted,  Constitutional: Pt appears in NAD HENT: Head: NCAT.  Right Ear: External ear normal.  Left Ear: External ear normal.  Eyes: . Pupils are equal, round, and reactive to light. Conjunctivae and EOM are normal Bilat tm's with mild erythema.  Max sinus areas non tender.  Pharynx with mild erythema, no exudate Nose: without d/c or deformity Neck: Neck supple. Gross normal ROM Cardiovascular: Normal rate and regular rhythm.   Pulmonary/Chest: Effort normal and breath sounds without rales or wheezing.  Neurological: Pt is alert. At baseline orientation, motor 5/5 intact,, sens intact to LT to UE's, grip normal Skin: Skin is warm. No rashes,  other new lesions, no LE edema Psychiatric: Pt behavior is normal without agitation  No other exam findings    Assessment & Plan:

## 2016-10-01 NOTE — Assessment & Plan Note (Signed)
Overall mild, exam benign, urged to restart the qhs bilat wrist splints, consider neurology if not improved in 1-2 wks

## 2016-10-28 DIAGNOSIS — L723 Sebaceous cyst: Secondary | ICD-10-CM | POA: Insufficient documentation

## 2016-10-28 DIAGNOSIS — J342 Deviated nasal septum: Secondary | ICD-10-CM | POA: Insufficient documentation

## 2016-12-09 ENCOUNTER — Other Ambulatory Visit (INDEPENDENT_AMBULATORY_CARE_PROVIDER_SITE_OTHER): Payer: BC Managed Care – PPO

## 2016-12-09 DIAGNOSIS — Z0001 Encounter for general adult medical examination with abnormal findings: Secondary | ICD-10-CM

## 2016-12-09 DIAGNOSIS — Z1159 Encounter for screening for other viral diseases: Secondary | ICD-10-CM

## 2016-12-09 LAB — BASIC METABOLIC PANEL
BUN: 20 mg/dL (ref 6–23)
CO2: 30 mEq/L (ref 19–32)
Calcium: 9.4 mg/dL (ref 8.4–10.5)
Chloride: 101 mEq/L (ref 96–112)
Creatinine, Ser: 0.99 mg/dL (ref 0.40–1.20)
GFR: 71.83 mL/min (ref >60.00)
Glucose, Bld: 106 mg/dL — ABNORMAL HIGH (ref 70–99)
Potassium: 4.3 mEq/L (ref 3.5–5.1)
Sodium: 138 mEq/L (ref 135–145)

## 2016-12-09 LAB — URINALYSIS, ROUTINE W REFLEX MICROSCOPIC
Bilirubin Urine: NEGATIVE
Hgb urine dipstick: NEGATIVE
Ketones, ur: NEGATIVE
Leukocytes, UA: NEGATIVE
Nitrite: NEGATIVE
RBC / HPF: NONE SEEN (ref 0–?)
Specific Gravity, Urine: 1.01 (ref 1.000–1.030)
Total Protein, Urine: NEGATIVE
Urine Glucose: NEGATIVE
Urobilinogen, UA: 0.2 (ref 0.0–1.0)
WBC, UA: NONE SEEN (ref 0–?)
pH: 7 (ref 5.0–8.0)

## 2016-12-09 LAB — HEPATIC FUNCTION PANEL
ALT: 10 U/L (ref 0–35)
AST: 12 U/L (ref 0–37)
Albumin: 3.9 g/dL (ref 3.5–5.2)
Alkaline Phosphatase: 77 U/L (ref 39–117)
Bilirubin, Direct: 0.2 mg/dL (ref 0.0–0.3)
Total Bilirubin: 1.2 mg/dL (ref 0.2–1.2)
Total Protein: 7.7 g/dL (ref 6.0–8.3)

## 2016-12-09 LAB — LIPID PANEL
Cholesterol: 150 mg/dL (ref 0–200)
HDL: 47.6 mg/dL (ref >39.00)
LDL Cholesterol: 90 mg/dL (ref 0–99)
NonHDL: 101.93
Total CHOL/HDL Ratio: 3
Triglycerides: 58 mg/dL (ref 0.0–149.0)
VLDL: 11.6 mg/dL (ref 0.0–40.0)

## 2016-12-09 LAB — CBC WITH DIFFERENTIAL/PLATELET
Basophils Absolute: 0.1 10*3/uL (ref 0.0–0.1)
Basophils Relative: 0.7 % (ref 0.0–3.0)
Eosinophils Absolute: 0.2 10*3/uL (ref 0.0–0.7)
Eosinophils Relative: 2.4 % (ref 0.0–5.0)
HCT: 40.2 % (ref 36.0–46.0)
Hemoglobin: 13.6 g/dL (ref 12.0–15.0)
Lymphocytes Relative: 28.4 % (ref 12.0–46.0)
Lymphs Abs: 2.2 10*3/uL (ref 0.7–4.0)
MCHC: 33.8 g/dL (ref 30.0–36.0)
MCV: 91 fl (ref 78.0–100.0)
Monocytes Absolute: 0.5 10*3/uL (ref 0.1–1.0)
Monocytes Relative: 6.7 % (ref 3.0–12.0)
Neutro Abs: 4.8 10*3/uL (ref 1.4–7.7)
Neutrophils Relative %: 61.8 % (ref 43.0–77.0)
Platelets: 238 10*3/uL (ref 150.0–400.0)
RBC: 4.42 Mil/uL (ref 3.87–5.11)
RDW: 13.5 % (ref 11.5–15.5)
WBC: 7.7 10*3/uL (ref 4.0–10.5)

## 2016-12-09 LAB — TSH: TSH: 4.14 u[IU]/mL (ref 0.35–4.50)

## 2016-12-10 LAB — HEPATITIS C ANTIBODY: HCV Ab: NEGATIVE

## 2016-12-19 ENCOUNTER — Ambulatory Visit (INDEPENDENT_AMBULATORY_CARE_PROVIDER_SITE_OTHER): Payer: BC Managed Care – PPO | Admitting: Internal Medicine

## 2016-12-19 VITALS — BP 126/84 | HR 60 | Ht 66.0 in | Wt 273.0 lb

## 2016-12-19 DIAGNOSIS — M25562 Pain in left knee: Secondary | ICD-10-CM | POA: Diagnosis not present

## 2016-12-19 DIAGNOSIS — E2839 Other primary ovarian failure: Secondary | ICD-10-CM

## 2016-12-19 DIAGNOSIS — I10 Essential (primary) hypertension: Secondary | ICD-10-CM | POA: Diagnosis not present

## 2016-12-19 DIAGNOSIS — R350 Frequency of micturition: Secondary | ICD-10-CM

## 2016-12-19 DIAGNOSIS — M25561 Pain in right knee: Secondary | ICD-10-CM

## 2016-12-19 DIAGNOSIS — G8929 Other chronic pain: Secondary | ICD-10-CM | POA: Diagnosis not present

## 2016-12-19 DIAGNOSIS — Z0001 Encounter for general adult medical examination with abnormal findings: Secondary | ICD-10-CM | POA: Diagnosis not present

## 2016-12-19 MED ORDER — SOLIFENACIN SUCCINATE 5 MG PO TABS: 5.0000 mg | ORAL_TABLET | Freq: Every day | ORAL | 3 refills | Status: DC

## 2016-12-19 NOTE — Patient Instructions (Addendum)
Please take all new medication as prescribed - the vesicare 5 mg per day  Please continue all other medications as before, and refills have been done if requested.  Please have the pharmacy call with any other refills you may need.  Please continue your efforts at being more active, low cholesterol diet, and weight control.  You are otherwise up to date with prevention measures today.  Please keep your appointments with your specialists as you may have planned  You will be contacted regarding the referral for: orthopedic  Please schedule the bone density test before leaving today at the scheduling desk (where you check out)  Please return in 1 year for your yearly visit, or sooner if needed, with Lab testing done 3-5 days before

## 2016-12-19 NOTE — Assessment & Plan Note (Signed)
Recent ua neg, exam benign, most likely c/w OAB, for vesicare 5 mg or similar,  to f/u any worsening symptoms or concerns

## 2016-12-19 NOTE — Progress Notes (Signed)
Subjective:    Patient ID: Veronica [name], female    DOB: 22-Dec-1950, 66 y.o.   MRN: [id number]  HPI Here for wellness and f/u;  Overall doing ok;  Pt denies Chest pain, worsening SOB, DOE, wheezing, orthopnea, PND, worsening LE edema, palpitations, dizziness or syncope.  Pt denies neurological change such as new headache, facial or extremity weakness.  Pt denies polydipsia, polyuria, or low sugar symptoms. Pt states overall good compliance with treatment and medications, good tolerability, and has been trying to follow appropriate diet.  Pt denies worsening depressive symptoms, suicidal ideation or panic. No fever, night sweats, wt loss, loss of appetite, or other constitutional symptoms.  Pt states good ability with ADL's, has low fall risk, home safety reviewed and adequate, no other significant changes in hearing or vision, and only occasionally active with exercise, due to ongoing knee pain getting worse.  Was better last yr with seeing Dr Smith/sports med, but now to the point she would consider surgury. Pt plans to call for mammogram herself.  Also has urinary issue now several times per day of urgency and freq and can only barely make it to the commode sometimes, even considering her knees and walking with cane. No other new hx Past Medical History:  Diagnosis Date  . Allergy   . Arthritis   . Broken ribs    hx of  . Carpal tunnel syndrome 12/27/2010  . Diverticulitis   . GERD (gastroesophageal reflux disease)   . Hemorrhoids   . Hypertension    Past Surgical History:  Procedure Laterality Date  . CHOLECYSTECTOMY  2000  . SHOULDER SURGERY Right 1999    reports that she has never smoked. She has never used smokeless tobacco. She reports that she does not drink alcohol or use drugs. family history includes Alcohol abuse in her other; Arthritis in her other; Cancer in her other; Heart disease in her other and other; Hypertension in her other. Allergies  Allergen Reactions  .  Clarithromycin    Current Outpatient Prescriptions on File Prior to Visit  Medication Sig Dispense Refill  . acetaminophen (TYLENOL) 650 MG CR tablet Take 650 mg by mouth every 8 (eight) hours as needed for pain.    Marland Kitchen aspirin 81 MG EC tablet Take 1 tablet (81 mg total) by mouth daily. Swallow whole. (Patient taking differently: Take 81 mg by mouth every 14 (fourteen) days. Swallow whole.) 30 tablet 12  . irbesartan-hydrochlorothiazide (AVALIDE) 150-12.5 MG tablet Take 2 tablets by mouth daily. 180 tablet 2  . traMADol (ULTRAM) 50 MG tablet take 1 tablet by mouth every 8 hours if needed 120 tablet 5  . fluticasone (FLONASE) 50 MCG/ACT nasal spray Place 2 sprays into both nostrils daily. 16 g 0   No current facility-administered medications on file prior to visit.    Review of Systems Constitutional: Negative for other unusual diaphoresis, sweats, appetite or weight changes HENT: Negative for other worsening hearing loss, ear pain, facial swelling, mouth sores or neck stiffness.   Eyes: Negative for other worsening pain, redness or other visual disturbance.  Respiratory: Negative for other stridor or swelling Cardiovascular: Negative for other palpitations or other chest pain  Gastrointestinal: Negative for worsening diarrhea or loose stools, blood in stool, distention or other pain Genitourinary: Negative for hematuria, flank pain or other change in urine volume.  Musculoskeletal: Negative for myalgias or other joint swelling.  Skin: Negative for other color change, or other wound or worsening drainage.  Neurological: Negative for other  syncope or numbness. Hematological: Negative for other adenopathy or swelling Psychiatric/Behavioral: Negative for hallucinations, other worsening agitation, SI, self-injury, or new decreased concentration All other system neg per pt    Objective:   Physical Exam BP 126/84   Pulse 60   Ht 5\' 6"  (1.676 m)   Wt 273 lb (123.8 kg)   SpO2 99%   BMI 44.06  kg/m  VS noted, morbid obese Constitutional: Pt is oriented to person, place, and time. Appears well-developed and well-nourished, in no significant distress and comfortable Head: Normocephalic and atraumatic  Eyes: Conjunctivae and EOM are normal. Pupils are equal, round, and reactive to light Right Ear: External ear normal without discharge Left Ear: External ear normal without discharge Nose: Nose without discharge or deformity Mouth/Throat: Oropharynx is without other ulcerations and moist  Neck: Normal range of motion. Neck supple. No JVD present. No tracheal deviation present or significant neck LA or mass Cardiovascular: Normal rate, regular rhythm, normal heart sounds and intact distal pulses.   Pulmonary/Chest: WOB normal and breath sounds without rales or wheezing  Abdominal: Soft. Bowel sounds are normal. NT. No HSM  Musculoskeletal: Normal range of motion. Exhibits no edema except for bilat knee degenerative changes with small effusions and decresaed ROM Lymphadenopathy: Has no other cervical adenopathy.  Neurological: Pt is alert and oriented to person, place, and time. Pt has normal reflexes. No cranial nerve deficit. Motor grossly intact, Gait intact Skin: Skin is warm and dry. No rash noted or new ulcerations Psychiatric:  Has normal mood and affect. Behavior is normal without agitation No other exam findings Lab Results  Component Value Date   WBC 7.7 12/09/2016   HGB 13.6 12/09/2016   HCT 40.2 12/09/2016   PLT 238.0 12/09/2016   GLUCOSE 106 (H) 12/09/2016   CHOL 150 12/09/2016   TRIG 58.0 12/09/2016   HDL 47.60 12/09/2016   LDLCALC 90 12/09/2016   ALT 10 12/09/2016   AST 12 12/09/2016   NA 138 12/09/2016   K 4.3 12/09/2016   CL 101 12/09/2016   CREATININE 0.99 12/09/2016   BUN 20 12/09/2016   CO2 30 12/09/2016   TSH 4.14 12/09/2016   INR 1.23 03/28/2015           Assessment & Plan:

## 2016-12-19 NOTE — Assessment & Plan Note (Signed)
.  stable overall by history and exam, recent data reviewed with pt, and pt to continue medical treatment as before,  to f/u any worsening symptoms or concerns BP Readings from Last 3 Encounters:  12/19/16 126/84  10/01/16 112/78  12/14/15 126/78

## 2016-12-19 NOTE — Assessment & Plan Note (Addendum)
Now worsening pain, for ortho referral - Dr Wynelle Link per request  In addition to the time spent performing CPE, I spent an additional 15 minutes face to face,in which greater than 50% of this time was spent in counseling and coordination of care for patient's illness as documented., including the differential dx, eval and tx plan for her bilat knee pain, urinary frequency, and HTN

## 2016-12-19 NOTE — Assessment & Plan Note (Signed)

## 2016-12-26 ENCOUNTER — Ambulatory Visit (INDEPENDENT_AMBULATORY_CARE_PROVIDER_SITE_OTHER)
Admission: RE | Admit: 2016-12-26 | Discharge: 2016-12-26 | Disposition: A | Discharge status: Home / Self Care | Payer: BC Managed Care – PPO | Source: Ambulatory Visit | Attending: Internal Medicine | Admitting: Internal Medicine

## 2016-12-26 DIAGNOSIS — E2839 Other primary ovarian failure: Secondary | ICD-10-CM

## 2017-01-01 ENCOUNTER — Other Ambulatory Visit: Payer: Self-pay | Admitting: Internal Medicine

## 2017-01-09 ENCOUNTER — Encounter: Payer: Self-pay | Admitting: Internal Medicine

## 2017-07-16 ENCOUNTER — Ambulatory Visit: Payer: BC Managed Care – PPO | Admitting: Internal Medicine

## 2017-07-16 ENCOUNTER — Encounter: Payer: Self-pay | Admitting: Internal Medicine

## 2017-07-16 ENCOUNTER — Other Ambulatory Visit (INDEPENDENT_AMBULATORY_CARE_PROVIDER_SITE_OTHER): Payer: BC Managed Care – PPO

## 2017-07-16 VITALS — BP 122/86 | HR 92 | Temp 98.2°F | Ht 66.0 in | Wt 277.0 lb

## 2017-07-16 DIAGNOSIS — R32 Unspecified urinary incontinence: Secondary | ICD-10-CM | POA: Diagnosis not present

## 2017-07-16 DIAGNOSIS — M1710 Unilateral primary osteoarthritis, unspecified knee: Secondary | ICD-10-CM | POA: Diagnosis not present

## 2017-07-16 DIAGNOSIS — N3946 Mixed incontinence: Secondary | ICD-10-CM | POA: Insufficient documentation

## 2017-07-16 DIAGNOSIS — M79602 Pain in left arm: Secondary | ICD-10-CM | POA: Insufficient documentation

## 2017-07-16 DIAGNOSIS — R609 Edema, unspecified: Secondary | ICD-10-CM

## 2017-07-16 DIAGNOSIS — I872 Venous insufficiency (chronic) (peripheral): Secondary | ICD-10-CM | POA: Insufficient documentation

## 2017-07-16 LAB — URINALYSIS, ROUTINE W REFLEX MICROSCOPIC
Bilirubin Urine: NEGATIVE
Hgb urine dipstick: NEGATIVE
Ketones, ur: NEGATIVE
Leukocytes, UA: NEGATIVE
Nitrite: NEGATIVE
RBC / HPF: NONE SEEN (ref 0–?)
Specific Gravity, Urine: 1.015 (ref 1.000–1.030)
Total Protein, Urine: NEGATIVE
Urine Glucose: NEGATIVE
Urobilinogen, UA: 1 (ref 0.0–1.0)
WBC, UA: NONE SEEN (ref 0–?)
pH: 7 (ref 5.0–8.0)

## 2017-07-16 MED ORDER — TRAMADOL HCL 50 MG PO TABS: ORAL_TABLET | ORAL | 5 refills | Status: DC

## 2017-07-16 MED ORDER — FUROSEMIDE 20 MG PO TABS: 20.0000 mg | ORAL_TABLET | Freq: Every day | ORAL | 11 refills | Status: DC | PRN

## 2017-07-16 NOTE — Assessment & Plan Note (Signed)
I suspect related to venous insufficiency, for compression stocking, wt loss, leg elevation and lasix 20 mg prn (limit 3 times per wk)

## 2017-07-16 NOTE — Patient Instructions (Addendum)
Your EKG was OK today  Please take all new medication as prescribed - the lasix fluid pill at 20 mg about every other day only as needed  Please continue all other medications as before, and refills have been done if requested - the ultram sent to the pharmacy  Please have the pharmacy call with any other refills you may need.  Please continue your efforts at being more active, low cholesterol diet, and weight control.  You are otherwise up to date with prevention measures today.  Please keep your appointments with your specialists as you may have planned  You will be contacted regarding the referral for: Urology, and Dr Tamala Julian  Please go to the LAB in the Basement (turn left off the elevator) for the tests to be done today - for the urine testing only  You will be contacted by phone if any changes need to be made immediately.  Otherwise, you will receive a letter about your results with an explanation, but please check with MyChart first.  Please remember to sign up for MyChart if you have not done so, as this will be important to you in the future with finding out test results, communicating by private email, and scheduling acute appointments online when needed.

## 2017-07-16 NOTE — Progress Notes (Signed)
Subjective:    Patient ID: Veronica [name], female    DOB: 1951-02-17, 67 y.o.   MRN: [id number]  HPI  Her with several concerns;   1) c/o urinary incontinence worsening for about 6 mo, vesicare only helped to a small degree so stopped, now feels fullness in the vagina - ? Dropped bladder; Denies urinary symptoms such as dysuria, frequency, urgency, flank pain, hematuria or n/v, fever, chills, though does have an odor recently.  . Sister has surgury for dropped bladder last yr, now improved.   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  Does have some post neck pain occasionally helped with the volt gel.  Also uses some "real time pain" cream on left arm that might help.  Also has a feeling of swelling, No arm numbness or weakness of grip.    3) also with mild worsening ankle/feet swelling for 1 wk (acute on chronic) only right foot swelling, no increased po fluid intake,  Did maybe sit longer a few days ago. Pt denies chest pain, increased sob or doe, wheezing, orthopnea, PND, palpitations, dizziness or syncope.  Echo normal [id number] without systolic or diastolic dysfxn Past Medical History:  Diagnosis Date  . Allergy   . Arthritis   . Broken ribs    hx of  . Carpal tunnel syndrome 12/27/2010  . Diverticulitis   . GERD (gastroesophageal reflux disease)   . Hemorrhoids   . Hypertension    Past Surgical History:  Procedure Laterality Date  . CHOLECYSTECTOMY  2000  . SHOULDER SURGERY Right 1999    reports that  has never smoked. she has never used smokeless tobacco. She reports that she does not drink alcohol or use drugs. family history  includes Alcohol abuse in her other; Arthritis in her other; Cancer in her other; Heart disease in her other and other; Hypertension in her other. Allergies  Allergen Reactions  . Clarithromycin    Current Outpatient Medications on File Prior to Visit  Medication Sig Dispense Refill  . acetaminophen (TYLENOL) 650 MG CR tablet Take 650 mg by mouth every 8 (eight) hours as needed for pain.    Marland Kitchen aspirin 81 MG EC tablet Take 1 tablet (81 mg total) by mouth daily. Swallow whole. (Patient taking differently: Take 81 mg by mouth every 14 (fourteen) days. Swallow whole.) 30 tablet 12  . irbesartan-hydrochlorothiazide (AVALIDE) 150-12.5 MG tablet take 2 tablets by mouth once daily 180 tablet 2  . solifenacin (VESICARE) 5 MG tablet Take 1 tablet (5 mg total) by mouth daily. 90 tablet 3  . fluticasone (FLONASE) 50 MCG/ACT nasal spray Place 2 sprays into both nostrils daily. 16 g 0   No current facility-administered medications on file prior to visit.    Review of Systems  Constitutional: Negative for other unusual diaphoresis or sweats HENT: Negative for ear discharge or swelling Eyes: Negative for other worsening visual disturbances Respiratory: Negative for stridor or other swelling  Gastrointestinal: Negative for worsening distension or other blood Genitourinary: Negative for retention or other urinary change Musculoskeletal: Negative for other  MSK pain or swelling Skin: Negative for color change or other new lesions Neurological: Negative for worsening tremors and other numbness  Psychiatric/Behavioral: Negative for worsening agitation or other fatigue All other system neg per pt    Objective:   Physical Exam BP 122/86   Pulse 92   Temp 98.2 F (36.8 C) (Oral)   Ht 5\' 6"  (1.676 m)   Wt 277 lb (125.6 kg)   SpO2 96%   BMI 44.71 kg/m  VS noted,  Constitutional: Pt appears in NAD HENT: Head: NCAT.  Right Ear: External ear normal.  Left Ear: External ear normal.  Eyes: . Pupils are  equal, round, and reactive to light. Conjunctivae and EOM are normal Nose: without d/c or deformity Neck: Neck supple. Gross normal ROM Cardiovascular: Normal rate and regular rhythm.   Pulmonary/Chest: Effort normal and breath sounds without rales or wheezing.  Abd:  Soft, NT, ND, + BS, no organomegaly Left shoulder with tender subacromial Neurological: Pt is alert. At baseline orientation, motor grossly intact Skin: Skin is warm. No rashes, other new lesions, has 1+ RLE ankle edema, and trace left foot edema Psychiatric: Pt behavior is normal without agitation  No other exam findings  ECG today I have personally interpreted: NSR 81 with occasional PVC    Assessment & Plan:

## 2017-07-16 NOTE — Assessment & Plan Note (Signed)
Chronic persistent stable, for ultram referral,  to f/u any worsening symptoms or concerns

## 2017-07-16 NOTE — Assessment & Plan Note (Signed)
?   Dropped bladder, for urology referral, and UA today

## 2017-07-16 NOTE — Assessment & Plan Note (Addendum)
ecg reviewed, have very low suspicion for cardiac, this is most likely c/w bursitis vs rot cuff disorder, for ultram as above, but also refer sports medicine - ? Need cortisone  Note:  Total time for pt hx, exam, review of record with pt in the room, determination of diagnoses and plan for further eval and tx is > 40 min, with over 50% spent in coordination and counseling of patient including the differential dx, tx, further evaluation and other management of left arm pain with concern for cardiac vs msk issue, urinary incontinence, chronic knee pain, and peripheral edema

## 2017-08-12 NOTE — Progress Notes (Signed)
[facility] [address] [place], [place] [address] Phone: [phone] Subjective:    I'm seeing this patient by the request  of:  Biagio Borg, MD   CC: Left arm pain  [id number]  Veronica [name] is a 67 y.o. female coming in with complaint of left arm pain.  Going on for greater than 1 month.  Worse at the shoulder but seems to radiate down the arm. Has numbness bilaterally.    Onset-over a month ago. Location- Lateral Duration- Wakes her up at night,morning Character- Achy Aggravating factors- Flexion and extension Reliving factors- Topicals, Tramadol Therapies tried-  Severity-7 out of 10     Past Medical History:  Diagnosis Date  . Allergy   . Arthritis   . Broken ribs    hx of  . Carpal tunnel syndrome 12/27/2010  . Diverticulitis   . GERD (gastroesophageal reflux disease)   . Hemorrhoids   . Hypertension    Past Surgical History:  Procedure Laterality Date  . CHOLECYSTECTOMY  2000  . SHOULDER SURGERY Right 1999   Social History   Socioeconomic History  . Marital status: Married    Spouse name: None  . Number of children: None  . Years of education: 5  . Highest education level: None  Social Needs  . Financial resource strain: None  . Food insecurity - worry: None  . Food insecurity - inability: None  . Transportation needs - medical: None  . Transportation needs - non-medical: None  Occupational History  . Occupation: Therapist, art: [employer]  Tobacco Use  . Smoking status: Never Smoker  . Smokeless tobacco: Never Used  Substance and Sexual Activity  . Alcohol use: No  . Drug use: No  . Sexual activity: None  Other Topics Concern  . None  Social History Narrative  . None   Allergies  Allergen Reactions  . Clarithromycin    Family History  Problem Relation Age of Onset  . Arthritis Other   . Alcohol abuse Other   . Heart disease Other   . Hypertension Other   .  Cancer Other   . Heart disease Other      Past medical history, social, surgical and family history all reviewed in electronic medical record.  No pertanent information unless stated regarding to the chief complaint.   Review of Systems:Review of systems updated and as accurate as of 08/13/17  No headache, visual changes, nausea, vomiting, diarrhea, constipation, dizziness, abdominal pain, skin rash, fevers, chills, night sweats, weight loss, swollen lymph nodes, body aches, joint swelling, muscle aches, chest pain, shortness of breath, mood changes.   Objective  Blood pressure 140/84, pulse (!) 105, height 5\' 6"  (1.676 m), weight 276 lb (125.2 kg), SpO2 98 %. Systems examined below as of 08/13/17   General: No apparent distress alert and oriented x3 mood and affect normal, dressed appropriately.  Morbidly obese HEENT: Pupils equal, extraocular movements intact  Respiratory: Patient's speak in full sentences and does not appear short of breath  Cardiovascular: No lower extremity edema, non tender, no erythema  Skin: Warm dry intact with no signs of infection or rash on extremities or on axial skeleton.  Abdomen: Soft nontender  Neuro: Cranial nerves II through XII are intact, neurovascularly intact in all extremities with 2+ DTRs and 2+ pulses.  Lymph: No lymphadenopathy of posterior or anterior cervical chain or axillae bilaterally.  Gait antalgic gait MSK:  Non  tender with full range of motion and good stability and symmetric strength and tone of  elbows, wrist, hip, knee and ankles bilaterally.  Arthritic changes of multiple joints Shoulder: left Inspection reveals no abnor. Palpation is normal with malities, atrophy or asymmetryno tenderness over AC joint or bicipital groove. ROM is limited range of motion in all planes.  Rotator cuff strength 4 out of 5 compared to contralateral side signs of impingement with positive Neer and Hawkin's tests, but negative empty can sign. Speeds and  Yergason's tests normal. Positive Obriens  Normal scapular function observed. ++ painful arch  No apprehension sign Contralateral shoulder unremarkable   Neck: Inspection mild loss of lordosis. No palpable stepoffs. Negative Spurling's maneuver. Full neck range of motion Grip strength and sensation normal in bilateral hands Strength good C4 to T1 distribution No sensory change to C4 to T1 Negative Hoffman sign bilaterally Reflexes normal  MSK US performed of: left This study was ordered, performed, and interpreted by Charlann Boxer D.O.  Shoulder:   Supraspinatus:  Degenerative changes with mild retraction but not full retraction.  AC joint:  Capsule undistended, no geyser sign. Glenohumeral Joint:  Degenerative changes noted.  Glenoid Labrum:  Intact without visualized tears. Biceps Tendon:  Appears normal on long and transverse views, no fraying of tendon, tendon located in intertubercular groove, no subluxation with shoulder internal or external rotation.  Impression: Subacromial bursitis, degenerative changes   Procedure: Real-time Ultrasound Guided Injection of left glenohumeral joint Device: GE Logiq E  Ultrasound guided injection is preferred based studies that show increased duration, increased effect, greater accuracy, decreased procedural pain, increased response rate with ultrasound guided versus blind injection.  Verbal informed consent obtained.  Time-out conducted.  Noted no overlying erythema, induration, or other signs of local infection.  Skin prepped in a sterile fashion.  Local anesthesia: Topical Ethyl chloride.  With sterile technique and under real time ultrasound guidance:  Joint visualized.  23g 1  inch needle inserted posterior approach. Pictures taken for needle placement. Patient did have injection of 2 cc of 1% lidocaine, 2 cc of 0.5% Marcaine, and 1.0 cc of Kenalog 40 mg/dL. Completed without difficulty  Pain immediately resolved suggesting accurate  placement of the medication.  Advised to call if fevers/chills, erythema, induration, drainage, or persistent bleeding.  Images permanently stored and available for review in the ultrasound unit.  Impression: Technically successful ultrasound guided injection.   [id number]; 15 additional minutes spent for Therapeutic exercises as stated in above notes.  This included exercises focusing on stretching, strengthening, with significant focus on eccentric aspects.   Long term goals include an improvement in range of motion, strength, endurance as well as avoiding reinjury. Patient's frequency would include in 1-2 times a day, 3-5 times a week for a duration of 6-12 weeks. Shoulder Exercises that included:  Basic scapular stabilization to include adduction and depression of scapula Scaption, focusing on proper movement and good control Internal and External rotation utilizing a theraband, with elbow tucked at side entire time Rows with theraband which was given   Proper technique shown and discussed handout in great detail with ATC.  All questions were discussed and answered.     Impression and Recommendations:     This case required medical decision making of moderate complexity.      Note: This dictation was prepared with Dragon dictation along with smaller phrase technology. Any transcriptional errors that result from this process are unintentional.

## 2017-08-13 ENCOUNTER — Encounter: Payer: Self-pay | Admitting: Family Medicine

## 2017-08-13 ENCOUNTER — Ambulatory Visit: Payer: BC Managed Care – PPO | Admitting: Family Medicine

## 2017-08-13 ENCOUNTER — Ambulatory Visit (INDEPENDENT_AMBULATORY_CARE_PROVIDER_SITE_OTHER)
Admission: RE | Admit: 2017-08-13 | Discharge: 2017-08-13 | Disposition: A | Discharge status: Home / Self Care | Payer: BC Managed Care – PPO | Source: Ambulatory Visit | Attending: Family Medicine | Admitting: Family Medicine

## 2017-08-13 ENCOUNTER — Other Ambulatory Visit: Payer: Self-pay

## 2017-08-13 ENCOUNTER — Ambulatory Visit: Payer: Self-pay

## 2017-08-13 VITALS — BP 140/84 | HR 105 | Ht 66.0 in | Wt 276.0 lb

## 2017-08-13 DIAGNOSIS — M79602 Pain in left arm: Secondary | ICD-10-CM

## 2017-08-13 DIAGNOSIS — M542 Cervicalgia: Secondary | ICD-10-CM

## 2017-08-13 DIAGNOSIS — M12812 Other specific arthropathies, not elsewhere classified, left shoulder: Secondary | ICD-10-CM | POA: Diagnosis not present

## 2017-08-13 DIAGNOSIS — M75102 Unspecified rotator cuff tear or rupture of left shoulder, not specified as traumatic: Secondary | ICD-10-CM

## 2017-08-13 MED ORDER — VITAMIN D (ERGOCALCIFEROL) 1.25 MG (50000 UNIT) PO CAPS: 50000.0000 [IU] | ORAL_CAPSULE | ORAL | 0 refills | Status: DC

## 2017-08-13 MED ORDER — DICLOFENAC SODIUM 2 % TD SOLN: 2.0000 g | Freq: Two times a day (BID) | TRANSDERMAL | 3 refills | Status: DC

## 2017-08-13 NOTE — Assessment & Plan Note (Signed)
Given injection.  Discussed icing regimen and home exercises.  Do feel that there is some rotator cuff arthropathy.  Worsening symptoms consider further evaluation with x-rays.  Declined formal physical therapy.  Follow-up again in 4 weeks

## 2017-08-13 NOTE — Patient Instructions (Signed)
Good to see you.  Ice 20 minutes 2 times daily. Usually after activity and before bed. Exercises 3 times a week.  pennsaid pinkie amount topically 2 times daily as needed.  Keep hands within peripheral vision  Injected shoulder today  Xray of neck as well  Start once weekly vitamin D for 12 weeks See me again in 4-6 weeks

## 2017-09-16 NOTE — Progress Notes (Signed)
[facility] [address] [place], [place] [address] Phone: [phone] Subjective:      CC: Shoulder pain follow-up  [id number]  Veronica [name] is a 67 y.o. female coming in with complaint of shoulder pain. She is doing better after getting the injection. Patient tries to refrain from internal rotation and shoulder flexion. She has some achyness in the morning and evenings.  Patient was found to have rotator cuff arthropathy.  Patient states that she has noticed improvement in range of motion, minimal radiation down the arm if any.  No side effects to any medications or vitamins.  Happy with the results of far.  Overall approximately 75% better.       Past Medical History:  Diagnosis Date  . Allergy   . Arthritis   . Broken ribs    hx of  . Carpal tunnel syndrome 12/27/2010  . Diverticulitis   . GERD (gastroesophageal reflux disease)   . Hemorrhoids   . Hypertension    Past Surgical History:  Procedure Laterality Date  . CHOLECYSTECTOMY  2000  . SHOULDER SURGERY Right 1999   Social History   Socioeconomic History  . Marital status: Married    Spouse name: Not on file  . Number of children: Not on file  . Years of education: 64  . Highest education level: Not on file  Occupational History  . Occupation: Therapist, art: [employer]  Social Needs  . Financial resource strain: Not on file  . Food insecurity:    Worry: Not on file    Inability: Not on file  . Transportation needs:    Medical: Not on file    Non-medical: Not on file  Tobacco Use  . Smoking status: Never Smoker  . Smokeless tobacco: Never Used  Substance and Sexual Activity  . Alcohol use: No  . Drug use: No  . Sexual activity: Not on file  Lifestyle  . Physical activity:    Days per week: Not on file    Minutes per session: Not on file  . Stress: Not on file  Relationships  . Social connections:    Talks on phone: Not on file    Gets together: Not on file    Attends religious service: Not on file    Active member of club or organization: Not on file    Attends meetings of clubs or organizations: Not on file    Relationship status: Not on file  Other Topics Concern  . Not on file  Social History Narrative  . Not on file   Allergies  Allergen Reactions  . Clarithromycin    Family History  Problem Relation Age of Onset  . Arthritis Other   . Alcohol abuse Other   . Heart disease Other   . Hypertension Other   . Cancer Other   . Heart disease Other      Past medical history, social, surgical and family history all reviewed in electronic medical record.  No pertanent information unless stated regarding to the chief complaint.   Review of Systems:Review of systems updated and as accurate as of 09/17/17  No headache, visual changes, nausea, vomiting, diarrhea, constipation, dizziness, abdominal pain, skin rash, fevers, chills, night sweats, weight loss, swollen lymph nodes, body aches, joint swelling, chest pain, shortness of breath, mood changes.  Mild positive muscle aches  Objective  Blood pressure 116/82, pulse 96, height 5\' 6"  (1.676 m), weight 274  lb (124.3 kg), SpO2 97 %. Systems examined below as of 09/17/17   General: No apparent distress alert and oriented x3 mood and affect normal, dressed appropriately.  HEENT: Pupils equal, extraocular movements intact  Respiratory: Patient's speak in full sentences and does not appear short of breath  Cardiovascular: No lower extremity edema, non tender, no erythema  Skin: Warm dry intact with no signs of infection or rash on extremities or on axial skeleton.  Abdomen: Soft nontender  Neuro: Cranial nerves II through XII are intact, neurovascularly intact in all extremities with 2+ DTRs and 2+ pulses.  Lymph: No lymphadenopathy of posterior or anterior cervical chain or axillae bilaterally.  Gait mild antalgic MSK:  tender with full range of motion and good  stability and symmetric strength and tone of  elbows, wrist, hip, knee and ankles bilaterally.  Arthritic changes of multiple joints Left shoulder exam still shows some mild crepitus with some decreasing range of motion and external range of motion by 10 degrees and internal range of motion to sacrum.  4+ out of 5 strength of the rotator cuff compared to the contralateral side.  Mild impingement     Impression and Recommendations:     This case required medical decision making of moderate complexity.      Note: This dictation was prepared with Dragon dictation along with smaller phrase technology. Any transcriptional errors that result from this process are unintentional.

## 2017-09-17 ENCOUNTER — Encounter: Payer: Self-pay | Admitting: Family Medicine

## 2017-09-17 ENCOUNTER — Ambulatory Visit: Payer: BC Managed Care – PPO | Admitting: Family Medicine

## 2017-09-17 DIAGNOSIS — M12812 Other specific arthropathies, not elsewhere classified, left shoulder: Secondary | ICD-10-CM | POA: Diagnosis not present

## 2017-09-17 DIAGNOSIS — M75102 Unspecified rotator cuff tear or rupture of left shoulder, not specified as traumatic: Secondary | ICD-10-CM | POA: Diagnosis not present

## 2017-09-17 NOTE — Assessment & Plan Note (Signed)
Patient did respond well to the injection.  Continue topical anti-inflammatories and icing regimen.  Discussed posture and ergonomics and encouraged to continue the vitamin D.  Follow-up with me again in 2 months

## 2017-09-17 NOTE — Patient Instructions (Signed)
Good to see you  You are doing great  Ice is your friend.  pennsaid pinkie amount topically 2 times daily as needed.   Try to keep hands within peripheral vision  Continue the vitamins See me again in 2 months!

## 2017-09-24 ENCOUNTER — Other Ambulatory Visit: Payer: Self-pay | Admitting: Internal Medicine

## 2017-09-24 DIAGNOSIS — Z1231 Encounter for screening mammogram for malignant neoplasm of breast: Secondary | ICD-10-CM

## 2017-09-29 ENCOUNTER — Ambulatory Visit
Admission: RE | Admit: 2017-09-29 | Discharge: 2017-09-29 | Disposition: A | Discharge status: Home / Self Care | Payer: BC Managed Care – PPO | Source: Ambulatory Visit | Attending: Internal Medicine | Admitting: Internal Medicine

## 2017-09-29 DIAGNOSIS — Z1231 Encounter for screening mammogram for malignant neoplasm of breast: Secondary | ICD-10-CM

## 2017-09-30 ENCOUNTER — Other Ambulatory Visit: Payer: Self-pay

## 2017-09-30 MED ORDER — IRBESARTAN-HYDROCHLOROTHIAZIDE 150-12.5 MG PO TABS: 2.0000 | ORAL_TABLET | Freq: Every day | ORAL | 2 refills | Status: DC

## 2017-11-18 NOTE — Progress Notes (Deleted)
[facility] [address] [place], [place] [address] Phone: [phone] Subjective:    I'm seeing this patient by the request  of:    CC:   [id number]  Kateryn D Guimont is a 67 y.o. female coming in with complaint of ***  Onset-  Location Duration-  Character- Aggravating factors- Reliving factors-  Therapies tried-  Severity-     Past Medical History:  Diagnosis Date  . Allergy   . Arthritis   . Broken ribs    hx of  . Carpal tunnel syndrome 12/27/2010  . Diverticulitis   . GERD (gastroesophageal reflux disease)   . Hemorrhoids   . Hypertension    Past Surgical History:  Procedure Laterality Date  . CHOLECYSTECTOMY  2000  . SHOULDER SURGERY Right 1999   Social History   Socioeconomic History  . Marital status: Married    Spouse name: Not on file  . Number of children: Not on file  . Years of education: 18  . Highest education level: Not on file  Occupational History  . 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 Financial resource strain: Not on file  . Food insecurity:    Worry: Not on file    Inability: Not on file  . Transportation needs:    Medical: Not on file    Non-medical: Not on file  Tobacco Use  . Smoking status: Never Smoker  . Smokeless tobacco: Never Used  Substance and Sexual Activity  . Alcohol use: No  . Drug use: No  . Sexual activity: Not on file  Lifestyle  . Physical activity:    Days per week: Not on file    Minutes per session: Not on file  . Stress: Not on file  Relationships  . Social connections:    Talks on phone: Not on file    Gets together: Not on file    Attends religious service: Not on file    Active member of club or organization: Not on file    Attends meetings of clubs or organizations: Not on file    Relationship status: Not on file  Other Topics Concern  . Not on file  Social History Narrative  . Not on file   Allergies    Allergen Reactions  . Clarithromycin    Family History  Problem Relation Age of Onset  . Arthritis Other   . Alcohol abuse Other   . Heart disease Other   . Hypertension Other   . Cancer Other   . Heart disease Other      Past medical history, social, surgical and family history all reviewed in electronic medical record.  No pertanent information unless stated regarding to the chief complaint.   Review of Systems:Review of systems updated and as accurate as of 11/18/17  No headache, visual changes, nausea, vomiting, diarrhea, constipation, dizziness, abdominal pain, skin rash, fevers, chills, night sweats, weight loss, swollen lymph nodes, body aches, joint swelling, muscle aches, chest pain, shortness of breath, mood changes.   Objective  There were no vitals taken for this visit. Systems examined below as of 11/18/17   General: No apparent distress alert and oriented x3 mood and affect normal, dressed appropriately.  HEENT: Pupils equal, extraocular movements intact  Respiratory: Patient's speak in full sentences and does not appear short of breath  Cardiovascular: No lower extremity edema, non tender, no erythema  Skin: Warm dry intact with  no signs of infection or rash on extremities or on axial skeleton.  Abdomen: Soft nontender  Neuro: Cranial nerves II through XII are intact, neurovascularly intact in all extremities with 2+ DTRs and 2+ pulses.  Lymph: No lymphadenopathy of posterior or anterior cervical chain or axillae bilaterally.  Gait normal with good balance and coordination.  MSK:  Non tender with full range of motion and good stability and symmetric strength and tone of shoulders, elbows, wrist, hip, knee and ankles bilaterally.     Impression and Recommendations:     This case required medical decision making of moderate complexity.      Note: This dictation was prepared with Dragon dictation along with smaller phrase technology. Any transcriptional  errors that result from this process are unintentional.

## 2017-11-19 ENCOUNTER — Ambulatory Visit: Payer: BC Managed Care – PPO | Admitting: Family Medicine

## 2017-11-19 DIAGNOSIS — Z0289 Encounter for other administrative examinations: Secondary | ICD-10-CM

## 2017-12-19 ENCOUNTER — Other Ambulatory Visit (INDEPENDENT_AMBULATORY_CARE_PROVIDER_SITE_OTHER): Payer: BC Managed Care – PPO

## 2017-12-19 DIAGNOSIS — Z0001 Encounter for general adult medical examination with abnormal findings: Secondary | ICD-10-CM | POA: Diagnosis not present

## 2017-12-19 LAB — URINALYSIS, ROUTINE W REFLEX MICROSCOPIC
Bilirubin Urine: NEGATIVE
Hgb urine dipstick: NEGATIVE
Ketones, ur: NEGATIVE
Leukocytes, UA: NEGATIVE
Nitrite: NEGATIVE
RBC / HPF: NONE SEEN (ref 0–?)
Specific Gravity, Urine: <=1.005 — AB (ref 1.000–1.030)
Total Protein, Urine: NEGATIVE
Urine Glucose: NEGATIVE
Urobilinogen, UA: 0.2 (ref 0.0–1.0)
WBC, UA: NONE SEEN (ref 0–?)
pH: 7 (ref 5.0–8.0)

## 2017-12-19 LAB — BASIC METABOLIC PANEL
BUN: 14 mg/dL (ref 6–23)
CO2: 29 mEq/L (ref 19–32)
Calcium: 8.8 mg/dL (ref 8.4–10.5)
Chloride: 97 mEq/L (ref 96–112)
Creatinine, Ser: 0.8 mg/dL (ref 0.40–1.20)
GFR: 91.57 mL/min (ref >60.00)
Glucose, Bld: 109 mg/dL — ABNORMAL HIGH (ref 70–99)
Potassium: 3.6 mEq/L (ref 3.5–5.1)
Sodium: 134 mEq/L — ABNORMAL LOW (ref 135–145)

## 2017-12-19 LAB — HEPATIC FUNCTION PANEL
ALT: 9 U/L (ref 0–35)
AST: 13 U/L (ref 0–37)
Albumin: 3.6 g/dL (ref 3.5–5.2)
Alkaline Phosphatase: 83 U/L (ref 39–117)
Bilirubin, Direct: 0.2 mg/dL (ref 0.0–0.3)
Total Bilirubin: 1.2 mg/dL (ref 0.2–1.2)
Total Protein: 7.4 g/dL (ref 6.0–8.3)

## 2017-12-19 LAB — CBC WITH DIFFERENTIAL/PLATELET
Basophils Absolute: 0.1 10*3/uL (ref 0.0–0.1)
Basophils Relative: 0.7 % (ref 0.0–3.0)
Eosinophils Absolute: 0.2 10*3/uL (ref 0.0–0.7)
Eosinophils Relative: 1.6 % (ref 0.0–5.0)
HCT: 37.8 % (ref 36.0–46.0)
Hemoglobin: 12.6 g/dL (ref 12.0–15.0)
Lymphocytes Relative: 23 % (ref 12.0–46.0)
Lymphs Abs: 2.1 10*3/uL (ref 0.7–4.0)
MCHC: 33.4 g/dL (ref 30.0–36.0)
MCV: 90.2 fl (ref 78.0–100.0)
Monocytes Absolute: 0.7 10*3/uL (ref 0.1–1.0)
Monocytes Relative: 7.8 % (ref 3.0–12.0)
Neutro Abs: 6.1 10*3/uL (ref 1.4–7.7)
Neutrophils Relative %: 66.9 % (ref 43.0–77.0)
Platelets: 259 10*3/uL (ref 150.0–400.0)
RBC: 4.19 Mil/uL (ref 3.87–5.11)
RDW: 13.3 % (ref 11.5–15.5)
WBC: 9.1 10*3/uL (ref 4.0–10.5)

## 2017-12-19 LAB — LIPID PANEL
Cholesterol: 139 mg/dL (ref 0–200)
HDL: 46.1 mg/dL (ref >39.00)
LDL Cholesterol: 81 mg/dL (ref 0–99)
NonHDL: 93.26
Total CHOL/HDL Ratio: 3
Triglycerides: 62 mg/dL (ref 0.0–149.0)
VLDL: 12.4 mg/dL (ref 0.0–40.0)

## 2017-12-19 LAB — TSH: TSH: 2.95 u[IU]/mL (ref 0.35–4.50)

## 2017-12-23 ENCOUNTER — Ambulatory Visit: Payer: BC Managed Care – PPO | Admitting: Internal Medicine

## 2018-01-12 ENCOUNTER — Encounter: Payer: Self-pay | Admitting: Internal Medicine

## 2018-01-12 ENCOUNTER — Ambulatory Visit (INDEPENDENT_AMBULATORY_CARE_PROVIDER_SITE_OTHER): Payer: BC Managed Care – PPO | Admitting: Internal Medicine

## 2018-01-12 VITALS — BP 126/84 | HR 101 | Temp 98.4°F | Ht 66.0 in | Wt 271.0 lb

## 2018-01-12 DIAGNOSIS — I10 Essential (primary) hypertension: Secondary | ICD-10-CM

## 2018-01-12 DIAGNOSIS — R739 Hyperglycemia, unspecified: Secondary | ICD-10-CM | POA: Diagnosis not present

## 2018-01-12 DIAGNOSIS — M1710 Unilateral primary osteoarthritis, unspecified knee: Secondary | ICD-10-CM | POA: Diagnosis not present

## 2018-01-12 DIAGNOSIS — M75102 Unspecified rotator cuff tear or rupture of left shoulder, not specified as traumatic: Secondary | ICD-10-CM | POA: Diagnosis not present

## 2018-01-12 DIAGNOSIS — I872 Venous insufficiency (chronic) (peripheral): Secondary | ICD-10-CM

## 2018-01-12 DIAGNOSIS — Z0001 Encounter for general adult medical examination with abnormal findings: Secondary | ICD-10-CM

## 2018-01-12 DIAGNOSIS — M12812 Other specific arthropathies, not elsewhere classified, left shoulder: Secondary | ICD-10-CM

## 2018-01-12 MED ORDER — TRAMADOL HCL 50 MG PO TABS: ORAL_TABLET | ORAL | 5 refills | Status: DC

## 2018-01-12 MED ORDER — FUROSEMIDE 20 MG PO TABS: 20.0000 mg | ORAL_TABLET | Freq: Every day | ORAL | 11 refills | Status: DC | PRN

## 2018-01-12 MED ORDER — IRBESARTAN-HYDROCHLOROTHIAZIDE 150-12.5 MG PO TABS: 2.0000 | ORAL_TABLET | Freq: Every day | ORAL | 3 refills | Status: DC

## 2018-01-12 NOTE — Assessment & Plan Note (Signed)
stable overall by history and exam, recent data reviewed with pt, and pt to continue medical treatment as before,  to f/u any worsening symptoms or concerns, for a1c with next labs 

## 2018-01-12 NOTE — Assessment & Plan Note (Signed)
Also for f/u sports medicine, cont tramadol prn

## 2018-01-12 NOTE — Progress Notes (Signed)
Subjective:    Patient ID: Veronica [name], female    DOB: 02/02/1951, 67 y.o.   MRN: [id number]  HPI Here for wellness and f/u;  Overall doing ok;  Pt denies Chest pain, worsening SOB, DOE, wheezing, orthopnea, PND, worsening LE edema, palpitations, dizziness or syncope.  Pt denies neurological change such as new headache, facial or extremity weakness.  Pt denies polydipsia, polyuria, or low sugar symptoms. Pt states overall good compliance with treatment and medications, good tolerability, and has been trying to follow appropriate diet.  Pt denies worsening depressive symptoms, suicidal ideation or panic. No fever, night sweats, wt loss, loss of appetite, or other constitutional symptoms.  Pt states good ability with ADL's, has low fall risk, home safety reviewed and adequate, no other significant changes in hearing or vision, and only occasionally active with exercise.  Not really trying to lose wt but has lost 5 lbs.   Wt Readings from Last 3 Encounters:  01/12/18 271 lb (122.9 kg)  09/17/17 274 lb (124.3 kg)  08/13/17 276 lb (125.2 kg)   Past Medical History:  Diagnosis Date  . Allergy   . Arthritis   . Broken ribs    hx of  . Carpal tunnel syndrome 12/27/2010  . Diverticulitis   . GERD (gastroesophageal reflux disease)   . Hemorrhoids   . Hypertension    Past Surgical History:  Procedure Laterality Date  . CHOLECYSTECTOMY  2000  . SHOULDER SURGERY Right 1999    reports that she has never smoked. She has never used smokeless tobacco. She reports that she does not drink alcohol or use drugs. family history includes Alcohol abuse in her other; Arthritis in her other; Cancer in her other; Heart disease in her other and other; Hypertension in her other. Allergies  Allergen Reactions  . Clarithromycin    Current Outpatient Medications on File Prior to Visit  Medication Sig Dispense Refill  . acetaminophen (TYLENOL) 650 MG CR tablet Take 650 mg by mouth every 8 (eight) hours as  needed for pain.    Marland Kitchen aspirin 81 MG EC tablet Take 1 tablet (81 mg total) by mouth daily. Swallow whole. (Patient taking differently: Take 81 mg by mouth every 14 (fourteen) days. Swallow whole.) 30 tablet 12  . fluticasone (FLONASE) 50 MCG/ACT nasal spray Place 2 sprays into both nostrils daily. 16 g 0   No current facility-administered medications on file prior to visit.    Review of Systems Constitutional: Negative for other unusual diaphoresis, sweats, appetite or weight changes HENT: Negative for other worsening hearing loss, ear pain, facial swelling, mouth sores or neck stiffness.   Eyes: Negative for other worsening pain, redness or other visual disturbance.  Respiratory: Negative for other stridor or swelling Cardiovascular: Negative for other palpitations or other chest pain  Gastrointestinal: Negative for worsening diarrhea or loose stools, blood in stool, distention or other pain Genitourinary: Negative for hematuria, flank pain or other change in urine volume.  Musculoskeletal: Negative for myalgias or other joint swelling.  Skin: Negative for other color change, or other wound or worsening drainage.  Neurological: Negative for other syncope or numbness. Hematological: Negative for other adenopathy or swelling Psychiatric/Behavioral: Negative for hallucinations, other worsening agitation, SI, self-injury, or new decreased concentration All other system neg per pt    Objective:   Physical Exam BP 126/84   Pulse (!) 101   Temp 98.4 F (36.9 C) (Oral)   Ht 5\' 6"  (1.676 m)   Wt 271 lb (122.9  kg)   SpO2 96%   BMI 43.74 kg/m  VS noted,  Constitutional: Pt is oriented to person, place, and time. Appears well-developed and well-nourished, in no significant distress and comfortable Head: Normocephalic and atraumatic  Eyes: Conjunctivae and EOM are normal. Pupils are equal, round, and reactive to light Right Ear: External ear normal without discharge Left Ear: External ear  normal without discharge Nose: Nose without discharge or deformity Mouth/Throat: Oropharynx is without other ulcerations and moist  Neck: Normal range of motion. Neck supple. No JVD present. No tracheal deviation present or significant neck LA or mass Cardiovascular: Normal rate, regular rhythm, normal heart sounds and intact distal pulses.   Pulmonary/Chest: WOB normal and breath sounds without rales or wheezing  Abdominal: Soft. Bowel sounds are normal. NT. No HSM  Musculoskeletal: Normal range of motion, except for left shoulder unable to abduct > 90 degrees due to pain, also Exhibits chronic 1+ venous edema Lymphadenopathy: Has no other cervical adenopathy.  Neurological: Pt is alert and oriented to person, place, and time. Pt has normal reflexes. No cranial nerve deficit. Motor grossly intact, Gait intact Skin: Skin is warm and dry. No rash noted or new ulcerations Psychiatric:  Has normal mood and affect. Behavior is normal without agitation No other exam findings Lab Results  Component Value Date   WBC 9.1 12/19/2017   HGB 12.6 12/19/2017   HCT 37.8 12/19/2017   PLT 259.0 12/19/2017   GLUCOSE 109 (H) 12/19/2017   CHOL 139 12/19/2017   TRIG 62.0 12/19/2017   HDL 46.10 12/19/2017   LDLCALC 81 12/19/2017   ALT 9 12/19/2017   AST 13 12/19/2017   NA 134 (L) 12/19/2017   K 3.6 12/19/2017   CL 97 12/19/2017   CREATININE 0.80 12/19/2017   BUN 14 12/19/2017   CO2 29 12/19/2017   TSH 2.95 12/19/2017   INR 1.23 03/28/2015       Assessment & Plan:

## 2018-01-12 NOTE — Assessment & Plan Note (Addendum)
Venous insufficiency most likely, has hct with BP med but also lasix prn which she takes only infrequently, advised for compression stockings, low salt, wt loss

## 2018-01-12 NOTE — Assessment & Plan Note (Signed)
stable overall by history and exam, recent data reviewed with pt, and pt to continue medical treatment as before,  to f/u any worsening symptoms or concerns  

## 2018-01-12 NOTE — Assessment & Plan Note (Addendum)
Better initially with cortisone per sports med, but now with recurrent pain, ok for tramadol refill, f/u with sports medicine   In addition to the time spent performing CPE, I spent an additional 25 minutes face to face,in which greater than 50% of this time was spent in counseling and coordination of care for patient's acute illness as documented, including the differential dx, treatment, further evaluation and other management of left shoulder pain, HTN, hyperglycemia, bilat knee DJD, and venous insufficiency

## 2018-01-12 NOTE — Patient Instructions (Addendum)
Please continue all other medications as before, and refills have been done if requested.  Please have the pharmacy call with any other refills you may need.  Please continue your efforts at being more active, low cholesterol diet, and weight control.  You are otherwise up to date with prevention measures today.  Please keep your appointments with your specialists as you may have planned  Please make an appt with Dr Tamala Julian for follow up on the left shoulder  Please return in 1 year for your yearly visit, or sooner if needed, with Lab testing done 3-5 days before

## 2018-01-29 NOTE — Progress Notes (Signed)
[facility] [address] [place], [place] [address] Phone: [phone] Subjective:     CC: Right shoulder pain  URK:[id number]  Veronica [name] is a 67 y.o. female coming in with complaint of shoulder pain. Shoulder is painful today. Had inject in feburary. Would like one today.  Found initially to have more of a mild rotator cuff arthropathy.  Patient was last injected 6 months ago.  Worsening pain that is waking her up at night, acting daily activities, states things such as sleeping has become difficult    Past Medical History:  Diagnosis Date  . Allergy   . Arthritis   . Broken ribs    hx of  . Carpal tunnel syndrome 12/27/2010  . Diverticulitis   . GERD (gastroesophageal reflux disease)   . Hemorrhoids   . Hypertension    Past Surgical History:  Procedure Laterality Date  . CHOLECYSTECTOMY  2000  . SHOULDER SURGERY Right 1999   Social History   Socioeconomic History  . Marital status: Married    Spouse name: Not on file  . Number of children: Not on file  . Years of education: 43  . Highest education level: Not on file  Occupational History  . Occupation: Therapist, art: [employer]  Social Needs  . Financial resource strain: Not on file  . Food insecurity:    Worry: Not on file    Inability: Not on file  . Transportation needs:    Medical: Not on file    Non-medical: Not on file  Tobacco Use  . Smoking status: Never Smoker  . Smokeless tobacco: Never Used  Substance and Sexual Activity  . Alcohol use: No  . Drug use: No  . Sexual activity: Not on file  Lifestyle  . Physical activity:    Days per week: Not on file    Minutes per session: Not on file  . Stress: Not on file  Relationships  . Social connections:    Talks on phone: Not on file    Gets together: Not on file    Attends religious service: Not on file    Active member of club or organization: Not on file    Attends meetings of  clubs or organizations: Not on file    Relationship status: Not on file  Other Topics Concern  . Not on file  Social History Narrative  . Not on file   Allergies  Allergen Reactions  . Clarithromycin    Family History  Problem Relation Age of Onset  . Arthritis Other   . Alcohol abuse Other   . Heart disease Other   . Hypertension Other   . Cancer Other   . Heart disease Other      Past medical history, social, surgical and family history all reviewed in electronic medical record.  No pertanent information unless stated regarding to the chief complaint.   Review of Systems:Review of systems updated and as accurate as of 02/02/18  No headache, visual changes, nausea, vomiting, diarrhea, constipation, dizziness, abdominal pain, skin rash, fevers, chills, night sweats, weight loss, swollen lymph nodes, body aches,  chest pain, shortness of breath, mood changes.  Positive muscle aches and joint swelling  Objective  Blood pressure (!) 160/90, pulse (!) 101, height 5\' 6"  (1.676 m), weight 270 lb (122.5 kg), SpO2 98 %. Systems examined below as of 02/02/18   General: No apparent distress alert and oriented x3 mood  and affect normal, dressed appropriately.  HEENT: Pupils equal, extraocular movements intact  Respiratory: Patient's speak in full sentences and does not appear short of breath  Cardiovascular: No lower extremity edema, non tender, no erythema  Skin: Warm dry intact with no signs of infection or rash on extremities or on axial skeleton.  Abdomen: Soft nontender  Neuro: Cranial nerves II through XII are intact, neurovascularly intact in all extremities with 2+ DTRs and 2+ pulses.  Lymph: No lymphadenopathy of posterior or anterior cervical chain or axillae bilaterally.  Gait normal with good balance and coordination.  MSK:  Non tender with full range of motion and good stability and symmetric strength and tone of  elbows, wrist, hip, knee and ankles bilaterally.  Shoulder:  Left Inspection reveals no abnormalities, atrophy or asymmetry. Palpation is normal with no tenderness over AC joint or bicipital groove. ROM lacks last 5 degrees of motion in all planes Rotator cuff strength normal throughout. Positive impingement Speeds and Yergason's tests normal. Positive O'Brien's Normal scapular function observed. Mild painful arc No apprehension sign Contralateral shoulder unremarkable  Procedure: Real-time Ultrasound Guided Injection of left glenohumeral joint Device: GE Logiq E  Ultrasound guided injection is preferred based studies that show increased duration, increased effect, greater accuracy, decreased procedural pain, increased response rate with ultrasound guided versus blind injection.  Verbal informed consent obtained.  Time-out conducted.  Noted no overlying erythema, induration, or other signs of local infection.  Skin prepped in a sterile fashion.  Local anesthesia: Topical Ethyl chloride.  With sterile technique and under real time ultrasound guidance:  Joint visualized.  21g 2 inch needle inserted posterior approach. Pictures taken for needle placement. Patient did have injection of 2 cc of 0.5% Marcaine, and 1cc of Kenalog 40 mg/dL. Completed without difficulty  Pain immediately resolved suggesting accurate placement of the medication.  Advised to call if fevers/chills, erythema, induration, drainage, or persistent bleeding.  Images permanently stored and available for review in the ultrasound unit.  Impression: Technically successful ultrasound guided injection.   Impression and Recommendations:     This case required medical decision making of moderate complexity.      Note: This dictation was prepared with Dragon dictation along with smaller phrase technology. Any transcriptional errors that result from this process are unintentional.

## 2018-02-02 ENCOUNTER — Ambulatory Visit: Payer: Self-pay

## 2018-02-02 ENCOUNTER — Ambulatory Visit: Payer: BC Managed Care – PPO | Admitting: Family Medicine

## 2018-02-02 ENCOUNTER — Encounter: Payer: Self-pay | Admitting: Family Medicine

## 2018-02-02 VITALS — BP 160/90 | HR 101 | Ht 66.0 in | Wt 270.0 lb

## 2018-02-02 DIAGNOSIS — M75102 Unspecified rotator cuff tear or rupture of left shoulder, not specified as traumatic: Secondary | ICD-10-CM

## 2018-02-02 DIAGNOSIS — G8929 Other chronic pain: Secondary | ICD-10-CM

## 2018-02-02 DIAGNOSIS — M12812 Other specific arthropathies, not elsewhere classified, left shoulder: Secondary | ICD-10-CM

## 2018-02-02 DIAGNOSIS — M25512 Pain in left shoulder: Secondary | ICD-10-CM

## 2018-02-02 NOTE — Patient Instructions (Signed)
Good to see you  Veronica [name] is your friend.  Injected again  Stay active See me again in 4 weeks to make sure you are doing well

## 2018-02-02 NOTE — Assessment & Plan Note (Signed)
Repeat injection given today.  Tolerated the procedure well.  Was having worsening symptoms.  Patient has done physical therapy previously, home exercises and icing regimen and encourage patient to repeat always again.  Hoping that this injection helps for a longer time.  We discussed the possibility of advanced imaging but I do not think it would actually change her management.  Also discussed PRP as a potential way to arrest some of the arthritis if necessary.  Patient will follow-up with me again in 4 weeks

## 2018-03-03 NOTE — Progress Notes (Signed)
[facility] [place] [place], [place] [address] Phone: [phone] Subjective:    I Veronica [name] am serving as a Education administrator for Dr. Hulan Saas.     CC: Left shoulder pain follow-up  [id number]  Veronica [name] is a 67 y.o. female coming in with complaint of left shoulder pain. States that her shoulder is much better.  States approximately 80 to 85% better.  Has noticed that she has been using it on a more frequent basis.  Patient denies any significant radiation down the arm.  No longer waking her up at night.  Fairly happy with the results.       Past Medical History:  Diagnosis Date  . Allergy   . Arthritis   . Broken ribs    hx of  . Carpal tunnel syndrome 12/27/2010  . Diverticulitis   . GERD (gastroesophageal reflux disease)   . Hemorrhoids   . Hypertension    Past Surgical History:  Procedure Laterality Date  . CHOLECYSTECTOMY  2000  . SHOULDER SURGERY Right 1999   Social History   Socioeconomic History  . Marital status: Married    Spouse name: Not on file  . Number of children: Not on file  . Years of education: 98  . Highest education level: Not on file  Occupational History  . Occupation: Therapist, art: [employer]  Social Needs  . Financial resource strain: Not on file  . Food insecurity:    Worry: Not on file    Inability: Not on file  . Transportation needs:    Medical: Not on file    Non-medical: Not on file  Tobacco Use  . Smoking status: Never Smoker  . Smokeless tobacco: Never Used  Substance and Sexual Activity  . Alcohol use: No  . Drug use: No  . Sexual activity: Not on file  Lifestyle  . Physical activity:    Days per week: Not on file    Minutes per session: Not on file  . Stress: Not on file  Relationships  . Social connections:    Talks on phone: Not on file    Gets together: Not on file    Attends religious service: Not on file    Active member of club or  organization: Not on file    Attends meetings of clubs or organizations: Not on file    Relationship status: Not on file  Other Topics Concern  . Not on file  Social History Narrative  . Not on file   Allergies  Allergen Reactions  . Clarithromycin    Family History  Problem Relation Age of Onset  . Arthritis Other   . Alcohol abuse Other   . Heart disease Other   . Hypertension Other   . Cancer Other   . Heart disease Other      Current Outpatient Medications (Cardiovascular):  .  furosemide (LASIX) 20 MG tablet, Take 1 tablet (20 mg total) by mouth daily as needed. .  irbesartan-hydrochlorothiazide (AVALIDE) 150-12.5 MG tablet, Take 2 tablets by mouth daily.  Current Outpatient Medications (Respiratory):  .  fluticasone (FLONASE) 50 MCG/ACT nasal spray, Place 2 sprays into both nostrils daily.  Current Outpatient Medications (Analgesics):  .  acetaminophen (TYLENOL) 650 MG CR tablet, Take 650 mg by mouth every 8 (eight) hours as needed for pain. Marland Kitchen  aspirin 81 MG EC tablet, Take 1 tablet (81 mg total) by mouth daily. Swallow  whole. (Patient taking differently: Take 81 mg by mouth every 14 (fourteen) days. Swallow whole.) .  traMADol (ULTRAM) 50 MG tablet, take 1 tablet by mouth every 8 hours if needed      Past medical history, social, surgical and family history all reviewed in electronic medical record.  No pertanent information unless stated regarding to the chief complaint.   Review of Systems:  No headache, visual changes, nausea, vomiting, diarrhea, constipation, dizziness, abdominal pain, skin rash, fevers, chills, night sweats, weight loss, swollen lymph nodes, body aches, joint swelling,  chest pain, shortness of breath, mood changes.  Mild positive muscle aches  Objective  Blood pressure 140/82, pulse 97, height 5\' 6"  (1.676 m), weight 265 lb (120.2 kg), SpO2 97 %.   General: No apparent distress alert and oriented x3 mood and affect normal, dressed  appropriately.  HEENT: Pupils equal, extraocular movements intact  Respiratory: Patient's speak in full sentences and does not appear short of breath  Cardiovascular: Trace lower extremity edema, non tender, no erythema  Skin: Warm dry intact with no signs of infection or rash on extremities or on axial skeleton.  Abdomen: Soft morbidly obese Neuro: Cranial nerves II through XII are intact, neurovascularly intact in all extremities with 2+ DTRs and 2+ pulses.  Lymph: No lymphadenopathy of posterior or anterior cervical chain or axillae bilaterally.  Gait antalgic gait walking with the aid of a cane MSK:  Non tender with full range of motion and good stability and symmetric strength and tone of  elbows, wrist, hip, knee and ankles bilaterally.  Arthritic changes of multiple joints Left shoulder exam still shows that patient does have some weakness of the scapula.  Some mild atrophy of the overlying musculature.  Crepitus noted with loss of 5 degrees rotation in all planes but some mild improvement in strength from previous exam.  Positive impingement still noted.    Impression and Recommendations:     This case required medical decision making of moderate complexity. The above documentation has been reviewed and is accurate and complete Lyndal Pulley, DO       Note: This dictation was prepared with Dragon dictation along with smaller phrase technology. Any transcriptional errors that result from this process are unintentional.

## 2018-03-04 ENCOUNTER — Encounter: Payer: Self-pay | Admitting: Family Medicine

## 2018-03-04 ENCOUNTER — Ambulatory Visit: Payer: BC Managed Care – PPO | Admitting: Family Medicine

## 2018-03-04 DIAGNOSIS — M75102 Unspecified rotator cuff tear or rupture of left shoulder, not specified as traumatic: Secondary | ICD-10-CM | POA: Diagnosis not present

## 2018-03-04 DIAGNOSIS — M12812 Other specific arthropathies, not elsewhere classified, left shoulder: Secondary | ICD-10-CM

## 2018-03-04 NOTE — Assessment & Plan Note (Signed)
Better at this time.  Continue conservative therapy.  Discussed icing regimen and home exercise.  Discussed which activities to do which wants to avoid.  Patient will increase activity slowly over the course the next several weeks.  Follow-up again 2 to 3 months

## 2018-03-04 NOTE — Patient Instructions (Signed)
Good to see you  Ice is your friend  Keep working the shoulder Small gloves at night for the thumb Arnica lotion 2 times a day can help with pain  Continue the vitamins See me again in 2 months if still any pain

## 2018-05-04 NOTE — Progress Notes (Deleted)
[facility] [place], [place] [address] Phone: [phone] Subjective:    I'm seeing this patient by the request  of:    CC:   [id number]  Veronica [name] is a 67 y.o. female coming in with complaint of ***  Onset-  Location Duration-  Character- Aggravating factors- Reliving factors-  Therapies tried-  Severity-     Past Medical History:  Diagnosis Date  . Allergy   . Arthritis   . Broken ribs    hx of  . Carpal tunnel syndrome 12/27/2010  . Diverticulitis   . GERD (gastroesophageal reflux disease)   . Hemorrhoids   . Hypertension    Past Surgical History:  Procedure Laterality Date  . CHOLECYSTECTOMY  2000  . SHOULDER SURGERY Right 1999   Social History   Socioeconomic History  . Marital status: Married    Spouse name: Not on file  . Number of children: Not on file  . Years of education: 18  . Highest education level: Not on file  Occupational History  . Occupation: Therapist, art: [employer]  Social Needs  . Financial resource strain: Not on file  . Food insecurity:    Worry: Not on file    Inability: Not on file  . Transportation needs:    Medical: Not on file    Non-medical: Not on file  Tobacco Use  . Smoking status: Never Smoker  . Smokeless tobacco: Never Used  Substance and Sexual Activity  . Alcohol use: No  . Drug use: No  . Sexual activity: Not on file  Lifestyle  . Physical activity:    Days per week: Not on file    Minutes per session: Not on file  . Stress: Not on file  Relationships  . Social connections:    Talks on phone: Not on file    Gets together: Not on file    Attends religious service: Not on file    Active member of club or organization: Not on file    Attends meetings of clubs or organizations: Not on file    Relationship status: Not on file  Other Topics Concern  . Not on file  Social History Narrative  . Not on file   Allergies    Allergen Reactions  . Clarithromycin    Family History  Problem Relation Age of Onset  . Arthritis Other   . Alcohol abuse Other   . Heart disease Other   . Hypertension Other   . Cancer Other   . Heart disease Other      Current Outpatient Medications (Cardiovascular):  .  furosemide (LASIX) 20 MG tablet, Take 1 tablet (20 mg total) by mouth daily as needed. .  irbesartan-hydrochlorothiazide (AVALIDE) 150-12.5 MG tablet, Take 2 tablets by mouth daily.  Current Outpatient Medications (Respiratory):  .  fluticasone (FLONASE) 50 MCG/ACT nasal spray, Place 2 sprays into both nostrils daily.  Current Outpatient Medications (Analgesics):  .  acetaminophen (TYLENOL) 650 MG CR tablet, Take 650 mg by mouth every 8 (eight) hours as needed for pain. Marland Kitchen  aspirin 81 MG EC tablet, Take 1 tablet (81 mg total) by mouth daily. Swallow whole. (Patient taking differently: Take 81 mg by mouth every 14 (fourteen) days. Swallow whole.) .  traMADol (ULTRAM) 50 MG tablet, take 1 tablet by mouth every 8 hours if needed      Past medical history, social, surgical and family history  all reviewed in electronic medical record.  No pertanent information unless stated regarding to the chief complaint.   Review of Systems:  No headache, visual changes, nausea, vomiting, diarrhea, constipation, dizziness, abdominal pain, skin rash, fevers, chills, night sweats, weight loss, swollen lymph nodes, body aches, joint swelling, muscle aches, chest pain, shortness of breath, mood changes.   Objective  There were no vitals taken for this visit. Systems examined below as of    General: No apparent distress alert and oriented x3 mood and affect normal, dressed appropriately.  HEENT: Pupils equal, extraocular movements intact  Respiratory: Patient's speak in full sentences and does not appear short of breath  Cardiovascular: No lower extremity edema, non tender, no erythema  Skin: Warm dry intact with no signs of  infection or rash on extremities or on axial skeleton.  Abdomen: Soft nontender  Neuro: Cranial nerves II through XII are intact, neurovascularly intact in all extremities with 2+ DTRs and 2+ pulses.  Lymph: No lymphadenopathy of posterior or anterior cervical chain or axillae bilaterally.  Gait normal with good balance and coordination.  MSK:  Non tender with full range of motion and good stability and symmetric strength and tone of shoulders, elbows, wrist, hip, knee and ankles bilaterally.     Impression and Recommendations:     This case required medical decision making of moderate complexity. The above documentation has been reviewed and is accurate and complete Lyndal Pulley, DO       Note: This dictation was prepared with Dragon dictation along with smaller phrase technology. Any transcriptional errors that result from this process are unintentional.

## 2018-05-05 ENCOUNTER — Ambulatory Visit: Payer: BC Managed Care – PPO | Admitting: Family Medicine

## 2018-06-30 ENCOUNTER — Ambulatory Visit: Payer: BC Managed Care – PPO | Admitting: Family Medicine

## 2019-01-07 ENCOUNTER — Other Ambulatory Visit (INDEPENDENT_AMBULATORY_CARE_PROVIDER_SITE_OTHER): Payer: BC Managed Care – PPO

## 2019-01-07 DIAGNOSIS — R739 Hyperglycemia, unspecified: Secondary | ICD-10-CM | POA: Diagnosis not present

## 2019-01-07 DIAGNOSIS — Z0001 Encounter for general adult medical examination with abnormal findings: Secondary | ICD-10-CM

## 2019-01-07 LAB — HEPATIC FUNCTION PANEL
ALT: 11 U/L (ref 0–35)
AST: 15 U/L (ref 0–37)
Albumin: 3.8 g/dL (ref 3.5–5.2)
Alkaline Phosphatase: 74 U/L (ref 39–117)
Bilirubin, Direct: 0.4 mg/dL — ABNORMAL HIGH (ref 0.0–0.3)
Total Bilirubin: 1 mg/dL (ref 0.2–1.2)
Total Protein: 7.4 g/dL (ref 6.0–8.3)

## 2019-01-07 LAB — CBC WITH DIFFERENTIAL/PLATELET
Basophils Absolute: 0.1 10*3/uL (ref 0.0–0.1)
Basophils Relative: 0.7 % (ref 0.0–3.0)
Eosinophils Absolute: 0.3 10*3/uL (ref 0.0–0.7)
Eosinophils Relative: 3.6 % (ref 0.0–5.0)
HCT: 39.7 % (ref 36.0–46.0)
Hemoglobin: 13.2 g/dL (ref 12.0–15.0)
Lymphocytes Relative: 27.1 % (ref 12.0–46.0)
Lymphs Abs: 2.1 10*3/uL (ref 0.7–4.0)
MCHC: 33.3 g/dL (ref 30.0–36.0)
MCV: 91.4 fl (ref 78.0–100.0)
Monocytes Absolute: 0.6 10*3/uL (ref 0.1–1.0)
Monocytes Relative: 7.5 % (ref 3.0–12.0)
Neutro Abs: 4.8 10*3/uL (ref 1.4–7.7)
Neutrophils Relative %: 61.1 % (ref 43.0–77.0)
Platelets: 226 10*3/uL (ref 150.0–400.0)
RBC: 4.35 Mil/uL (ref 3.87–5.11)
RDW: 13.5 % (ref 11.5–15.5)
WBC: 7.9 10*3/uL (ref 4.0–10.5)

## 2019-01-07 LAB — LIPID PANEL
Cholesterol: 148 mg/dL (ref 0–200)
HDL: 48.2 mg/dL (ref >39.00)
LDL Cholesterol: 88 mg/dL (ref 0–99)
NonHDL: 100.02
Total CHOL/HDL Ratio: 3
Triglycerides: 61 mg/dL (ref 0.0–149.0)
VLDL: 12.2 mg/dL (ref 0.0–40.0)

## 2019-01-07 LAB — URINALYSIS, ROUTINE W REFLEX MICROSCOPIC
Bilirubin Urine: NEGATIVE
Hgb urine dipstick: NEGATIVE
Ketones, ur: NEGATIVE
Leukocytes,Ua: NEGATIVE
Nitrite: NEGATIVE
RBC / HPF: NONE SEEN (ref 0–?)
Specific Gravity, Urine: 1.025 (ref 1.000–1.030)
Total Protein, Urine: NEGATIVE
Urine Glucose: NEGATIVE
Urobilinogen, UA: 1 (ref 0.0–1.0)
pH: 6 (ref 5.0–8.0)

## 2019-01-07 LAB — BASIC METABOLIC PANEL
BUN: 16 mg/dL (ref 6–23)
CO2: 29 mEq/L (ref 19–32)
Calcium: 8.8 mg/dL (ref 8.4–10.5)
Chloride: 102 mEq/L (ref 96–112)
Creatinine, Ser: 0.81 mg/dL (ref 0.40–1.20)
GFR: 84.67 mL/min (ref >60.00)
Glucose, Bld: 99 mg/dL (ref 70–99)
Potassium: 3.5 mEq/L (ref 3.5–5.1)
Sodium: 139 mEq/L (ref 135–145)

## 2019-01-07 LAB — TSH: TSH: 3.99 u[IU]/mL (ref 0.35–4.50)

## 2019-01-07 LAB — HEMOGLOBIN A1C: Hgb A1c MFr Bld: 5.2 % (ref 4.6–6.5)

## 2019-01-14 ENCOUNTER — Ambulatory Visit (INDEPENDENT_AMBULATORY_CARE_PROVIDER_SITE_OTHER): Payer: BC Managed Care – PPO | Admitting: Internal Medicine

## 2019-01-14 ENCOUNTER — Other Ambulatory Visit: Payer: Self-pay

## 2019-01-14 ENCOUNTER — Encounter: Payer: Self-pay | Admitting: Internal Medicine

## 2019-01-14 VITALS — BP 126/84 | HR 89 | Temp 98.5°F | Ht 66.0 in | Wt 284.0 lb

## 2019-01-14 DIAGNOSIS — R222 Localized swelling, mass and lump, trunk: Secondary | ICD-10-CM

## 2019-01-14 DIAGNOSIS — M5412 Radiculopathy, cervical region: Secondary | ICD-10-CM | POA: Insufficient documentation

## 2019-01-14 DIAGNOSIS — R739 Hyperglycemia, unspecified: Secondary | ICD-10-CM

## 2019-01-14 DIAGNOSIS — Z0000 Encounter for general adult medical examination without abnormal findings: Secondary | ICD-10-CM | POA: Diagnosis not present

## 2019-01-14 DIAGNOSIS — Z0001 Encounter for general adult medical examination with abnormal findings: Secondary | ICD-10-CM

## 2019-01-14 DIAGNOSIS — R1013 Epigastric pain: Secondary | ICD-10-CM | POA: Diagnosis not present

## 2019-01-14 DIAGNOSIS — M25512 Pain in left shoulder: Secondary | ICD-10-CM

## 2019-01-14 DIAGNOSIS — K219 Gastro-esophageal reflux disease without esophagitis: Secondary | ICD-10-CM

## 2019-01-14 MED ORDER — IRBESARTAN-HYDROCHLOROTHIAZIDE 150-12.5 MG PO TABS: 2.0000 | ORAL_TABLET | Freq: Every day | ORAL | 3 refills | Status: DC

## 2019-01-14 MED ORDER — FUROSEMIDE 20 MG PO TABS: 20.0000 mg | ORAL_TABLET | Freq: Every day | ORAL | 11 refills | Status: DC | PRN

## 2019-01-14 MED ORDER — TRAMADOL HCL 50 MG PO TABS: ORAL_TABLET | ORAL | 5 refills | Status: DC

## 2019-01-14 MED ORDER — PANTOPRAZOLE SODIUM 40 MG PO TBEC: 40.0000 mg | DELAYED_RELEASE_TABLET | Freq: Every day | ORAL | 3 refills | Status: DC

## 2019-01-14 NOTE — Assessment & Plan Note (Signed)

## 2019-01-14 NOTE — Assessment & Plan Note (Addendum)
for GI as above, may need egd  In addition to the time spent performing CPE, I spent an additional 40 minutes face to face,in which greater than 50% of this time was spent in counseling and coordination of care for patient's acute illness as documented, including the differential dx, treatment, further evaluation and other management of epigastric pain, abd wall mass, cervical radiculopathy, left shoulder pain, hyperglycemia, gerd

## 2019-01-14 NOTE — Progress Notes (Signed)
Subjective:    Patient ID: Veronica [name], female    DOB: February 23, 1951, 68 y.o.   MRN: [id number]  HPI  Here for wellness and f/u;  Overall doing ok;  Pt denies Chest pain, worsening SOB, DOE, wheezing, orthopnea, PND, worsening LE edema, palpitations, dizziness or syncope.  Pt denies neurological change such as new headache, facial or extremity weakness.  Pt denies polydipsia, polyuria, or low sugar symptoms. Pt states overall good compliance with treatment and medications, good tolerability, and has been trying to follow appropriate diet.  Pt denies worsening depressive symptoms, suicidal ideation or panic. No fever, night sweats, wt loss, loss of appetite, or other constitutional symptoms.  Pt states good ability with ADL's, has low fall risk, home safety reviewed and adequate, no other significant changes in hearing or vision, and only occasionally active with exercise. Also Walks with cane due to chronic bilat knee and right ankle pain,swelling.  Working from home and very low chance of exposure to covid.  Has a friend + covid but no recent exposure.  Also with c/o 2-3 months knot like ball epigastric mass like feeling, mild, intermittent, no radiation, no n/v, fever or worsening bowel changes, though often has faux efforts at BMs and large belching in the AM when first get OOB.  Took a few zantac rarely but stopped after the recall a few months ago.  Now take TUMs a few time. Have been on nexium in the past, was improved.  Can have some sour brash in the AM with belching.  Appetite ok, no unintentional wt loss, No blood from any source.  Also with recurrent left shoudler pain with reduced ROM to abduction and forward elevateion, for 1 mo, mod to severe, constant, worse to lie on left side, better to not do that.  No trauma, or falls.  Plans to see Dr Tamala Julian soon, but needs pain med.  Also with c/o persistent numb painful bilat UE to hands, mostly radial nerve distribution without UE weakness.  Past  Medical History:  Diagnosis Date  . Allergy   . Arthritis   . Broken ribs    hx of  . Carpal tunnel syndrome 12/27/2010  . Diverticulitis   . GERD (gastroesophageal reflux disease)   . Hemorrhoids   . Hypertension    Past Surgical History:  Procedure Laterality Date  . CHOLECYSTECTOMY  2000  . SHOULDER SURGERY Right 1999    reports that she has never smoked. She has never used smokeless tobacco. She reports that she does not drink alcohol or use drugs. family history includes Alcohol abuse in an other family member; Arthritis in an other family member; Cancer in an other family member; Heart disease in some other family members; Hypertension in an other family member. Allergies  Allergen Reactions  . Clarithromycin    Current Outpatient Medications on File Prior to Visit  Medication Sig Dispense Refill  . acetaminophen (TYLENOL) 650 MG CR tablet Take 650 mg by mouth every 8 (eight) hours as needed for pain.    Marland Kitchen aspirin 81 MG EC tablet Take 1 tablet (81 mg total) by mouth daily. Swallow whole. (Patient taking differently: Take 81 mg by mouth every 14 (fourteen) days. Swallow whole.) 30 tablet 12   No current facility-administered medications on file prior to visit.    Review of Systems Constitutional: Negative for other unusual diaphoresis, sweats, appetite or weight changes HENT: Negative for other worsening hearing loss, ear pain, facial swelling, mouth sores or neck stiffness.  Eyes: Negative for other worsening pain, redness or other visual disturbance.  Respiratory: Negative for other stridor or swelling Cardiovascular: Negative for other palpitations or other chest pain  Gastrointestinal: Negative for worsening diarrhea or loose stools, blood in stool, distention or other pain Genitourinary: Negative for hematuria, flank pain or other change in urine volume.  Musculoskeletal: Negative for myalgias or other joint swelling.  Skin: Negative for other color change, or other  wound or worsening drainage.  Neurological: Negative for other syncope or numbness. Hematological: Negative for other adenopathy or swelling Psychiatric/Behavioral: Negative for hallucinations, other worsening agitation, SI, self-injury, or new decreased concentration All other system neg per pt    Objective:   Physical Exam BP 126/84   Pulse 89   Temp 98.5 F (36.9 C) (Oral)   Ht 5\' 6"  (1.676 m)   Wt 284 lb (128.8 kg)   SpO2 96%   BMI 45.84 kg/m  VS noted,  Constitutional: Pt is oriented to person, place, and time. Appears well-developed and well-nourished, in no significant distress and comfortable Head: Normocephalic and atraumatic  Eyes: Conjunctivae and EOM are normal. Pupils are equal, round, and reactive to light Right Ear: External ear normal without discharge Left Ear: External ear normal without discharge Nose: Nose without discharge or deformity Mouth/Throat: Oropharynx is without other ulcerations and moist  Neck: Normal range of motion. Neck supple. No JVD present. No tracheal deviation present or significant neck LA or mass Cardiovascular: Normal rate, regular rhythm, normal heart sounds and intact distal pulses.   Pulmonary/Chest: WOB normal and breath sounds without rales or wheezing  Abdominal: Soft. Bowel sounds are normal. NT. No HSM  Musculoskeletal: Normal range of motion. Exhibits no edema but mod to severe bilateral knee and ankle DJDj right > leftl left shoulder NT but pain on abduction and forward elavation to 90 degrees only Lymphadenopathy: Has no other cervical adenopathy.  Neurological: Pt is alert and oriented to person, place, and time. Pt has normal reflexes. No cranial nerve deficit. Motor grossly intact, Gait intact Skin: Skin is warm and dry. No rash noted or new ulcerations Psychiatric:  Has normal mood and affect. Behavior is normal without agitation No other exam findings Lab Results  Component Value Date   WBC 7.9 01/07/2019   HGB 13.2  01/07/2019   HCT 39.7 01/07/2019   PLT 226.0 01/07/2019   GLUCOSE 99 01/07/2019   CHOL 148 01/07/2019   TRIG 61.0 01/07/2019   HDL 48.20 01/07/2019   LDLCALC 88 01/07/2019   ALT 11 01/07/2019   AST 15 01/07/2019   NA 139 01/07/2019   K 3.5 01/07/2019   CL 102 01/07/2019   CREATININE 0.81 01/07/2019   BUN 16 01/07/2019   CO2 29 01/07/2019   TSH 3.99 01/07/2019   INR 1.23 03/28/2015   HGBA1C 5.2 01/07/2019        Assessment & Plan:

## 2019-01-14 NOTE — Assessment & Plan Note (Signed)
C/w possible rot cuff tendonitis or tear - for tramadol prn, refer Dr Smith/[facility]

## 2019-01-14 NOTE — Assessment & Plan Note (Signed)
?   Ventral hernia, for CT abd/pelvis, Gi referral

## 2019-01-14 NOTE — Assessment & Plan Note (Signed)
stable overall by history and exam, recent data reviewed with pt, and pt to continue medical treatment as before,  to f/u any worsening symptoms or concerns  

## 2019-01-14 NOTE — Assessment & Plan Note (Addendum)
With persistent neuritic pain bialt UE/s - for MRI c spine

## 2019-01-14 NOTE — Patient Instructions (Addendum)
Please take all new medication as prescribed - the tramadol, and the protonix  Please continue all other medications as before, and refills have been done if requested.  Please have the pharmacy call with any other refills you may need.  Please continue your efforts at being more active, low cholesterol diet, and weight control.  You are otherwise up to date with prevention measures today.  Please keep your appointments with your specialists as you may have planned  You will be contacted regarding the referral for: MRI for the neck, GI for the pain and swelling, and CT scan for the abdomen/[pelvis  Please also make an appt with Dr Tamala Julian at the checkout desk for the left shoulder  Please return in 6 months, or sooner if needed

## 2019-01-14 NOTE — Assessment & Plan Note (Signed)
Mild to mod, for protonix asd, to f/u any worsening symptoms or concerns

## 2019-01-21 ENCOUNTER — Encounter: Payer: Self-pay | Admitting: Gastroenterology

## 2019-02-01 ENCOUNTER — Ambulatory Visit
Admission: RE | Admit: 2019-02-01 | Discharge: 2019-02-01 | Disposition: A | Discharge status: Home / Self Care | Payer: BC Managed Care – PPO | Source: Ambulatory Visit | Attending: Internal Medicine | Admitting: Internal Medicine

## 2019-02-01 DIAGNOSIS — R222 Localized swelling, mass and lump, trunk: Secondary | ICD-10-CM

## 2019-02-01 DIAGNOSIS — R1013 Epigastric pain: Secondary | ICD-10-CM

## 2019-02-01 DIAGNOSIS — K219 Gastro-esophageal reflux disease without esophagitis: Secondary | ICD-10-CM

## 2019-02-01 MED ORDER — IOPAMIDOL (ISOVUE-300) INJECTION 61%
125.0000 mL | Freq: Once | INTRAVENOUS | Status: AC | PRN
  Administered 2019-02-01: 125 mL via INTRAVENOUS

## 2019-02-05 ENCOUNTER — Ambulatory Visit: Payer: Self-pay

## 2019-02-05 ENCOUNTER — Ambulatory Visit (INDEPENDENT_AMBULATORY_CARE_PROVIDER_SITE_OTHER): Payer: BC Managed Care – PPO | Admitting: Family Medicine

## 2019-02-05 ENCOUNTER — Encounter: Payer: Self-pay | Admitting: Family Medicine

## 2019-02-05 ENCOUNTER — Other Ambulatory Visit: Payer: Self-pay

## 2019-02-05 VITALS — BP 124/76 | HR 105 | Ht 66.0 in | Wt 279.0 lb

## 2019-02-05 DIAGNOSIS — G8929 Other chronic pain: Secondary | ICD-10-CM | POA: Diagnosis not present

## 2019-02-05 DIAGNOSIS — M75102 Unspecified rotator cuff tear or rupture of left shoulder, not specified as traumatic: Secondary | ICD-10-CM | POA: Diagnosis not present

## 2019-02-05 DIAGNOSIS — M12812 Other specific arthropathies, not elsewhere classified, left shoulder: Secondary | ICD-10-CM | POA: Diagnosis not present

## 2019-02-05 DIAGNOSIS — M25512 Pain in left shoulder: Secondary | ICD-10-CM

## 2019-02-05 NOTE — Progress Notes (Signed)
[facility] [address] [place], [place] [address] Phone: [phone] Subjective:   [name], am serving as a scribe for Dr. Hulan Saas.   CC: Left shoulder pain follow-up  [id number]   03/04/2018 Better at this time.  Continue conservative therapy.  Discussed icing regimen and home exercise.  Discussed which activities to do which wants to avoid.  Patient will increase activity slowly over the course the next several weeks.  Follow-up again 2 to 3 months  Update 02/05/2019 Veronica [name] is a 68 y.o. female coming in with complaint of left shoulder pain. Injected on 02/02/2018. Patient states that she has pain with ER and extension.  Does have bilateral tinging in the 5th fingers. Pain can come down into her elbow.  Patient denies of any significant neck pain.  Feels like she has been unfortunately having to grab the hand railing going up and down stairs more which she thinks is exacerbated the shoulder.  Starting give her some discomfort in the morning as well.     Past Medical History:  Diagnosis Date  . Allergy   . Arthritis   . Broken ribs    hx of  . Carpal tunnel syndrome 12/27/2010  . Diverticulitis   . GERD (gastroesophageal reflux disease)   . Hemorrhoids   . Hypertension    Past Surgical History:  Procedure Laterality Date  . CHOLECYSTECTOMY  2000  . SHOULDER SURGERY Right 1999   Social History   Socioeconomic History  . Marital status: Married    Spouse name: Not on file  . Number of children: Not on file  . Years of education: 24  . Highest education level: Not on file  Occupational History  . Occupation: Therapist, art: [employer]  Social Needs  . Financial resource strain: Not on file  . Food insecurity    Worry: Not on file    Inability: Not on file  . Transportation needs    Medical: Not on file    Non-medical: Not on file  Tobacco Use  . Smoking status: Never Smoker  .  Smokeless tobacco: Never Used  Substance and Sexual Activity  . Alcohol use: No  . Drug use: No  . Sexual activity: Not on file  Lifestyle  . Physical activity    Days per week: Not on file    Minutes per session: Not on file  . Stress: Not on file  Relationships  . Social Herbalist on phone: Not on file    Gets together: Not on file    Attends religious service: Not on file    Active member of club or organization: Not on file    Attends meetings of clubs or organizations: Not on file    Relationship status: Not on file  Other Topics Concern  . Not on file  Social History Narrative  . Not on file   Allergies  Allergen Reactions  . Clarithromycin    Family History  Problem Relation Age of Onset  . Arthritis Other   . Alcohol abuse Other   . Heart disease Other   . Hypertension Other   . Cancer Other   . Heart disease Other      Current Outpatient Medications (Cardiovascular):  .  furosemide (LASIX) 20 MG tablet, Take 1 tablet (20 mg total) by mouth daily as needed. .  irbesartan-hydrochlorothiazide (AVALIDE) 150-12.5 MG tablet, Take 2 tablets  by mouth daily.   Current Outpatient Medications (Analgesics):  .  acetaminophen (TYLENOL) 650 MG CR tablet, Take 650 mg by mouth every 8 (eight) hours as needed for pain. Marland Kitchen  aspirin 81 MG EC tablet, Take 1 tablet (81 mg total) by mouth daily. Swallow whole. (Patient taking differently: Take 81 mg by mouth every 14 (fourteen) days. Swallow whole.) .  traMADol (ULTRAM) 50 MG tablet, take 1 tablet by mouth every 8 hours if needed   Current Outpatient Medications (Other):  .  pantoprazole (PROTONIX) 40 MG tablet, Take 1 tablet (40 mg total) by mouth daily.    Past medical history, social, surgical and family history all reviewed in electronic medical record.  No pertanent information unless stated regarding to the chief complaint.   Review of Systems:  No headache, visual changes, nausea, vomiting, diarrhea,  constipation, dizziness, abdominal pain, skin rash, fevers, chills, night sweats, weight loss, swollen lymph nodes, body aches, joint swelling, , chest pain, shortness of breath, mood changes.  Positive muscle aches  Objective  Blood pressure 124/76, pulse (!) 105, height 5\' 6"  (1.676 m), weight 279 lb (126.6 kg), SpO2 97 %.    General: No apparent distress alert and oriented x3 mood and affect normal, dressed appropriately.  Morbidly obese HEENT: Pupils equal, extraocular movements intact  Respiratory: Patient's speak in full sentences and does not appear short of breath  Cardiovascular: 2+ lower extremity edema, non tender, no erythema  Skin: Warm dry intact with no signs of infection or rash on extremities or on axial skeleton.  Abdomen: Soft nontender  Neuro: Cranial nerves II through XII are intact, neurovascularly intact in all extremities with 2+ DTRs and 2+ pulses.  Lymph: No lymphadenopathy of posterior or anterior cervical chain or axillae bilaterally.  Gait severely antalgic walking with the aid of a cane MSK:  tender with limited range of motion and good stability and symmetric strength and tone of  elbows, wrist, hip, knee and ankles bilaterally.  Left shoulder exam shows the patient does have significant loss of range of motion in all planes secondary to crepitus.  Rotator cuff strength 3+ out of 5 compared to contralateral side.  Patient is neurovascular intact distally with 5 out of 5 strength of the upper extremity.  Neck exam does have some mild loss of lordosis and mild discomfort in the paraspinal musculature but no radicular symptoms with Spurling's.  Procedure: Real-time Ultrasound Guided Injection of left glenohumeral joint Device: GE Logiq E  Ultrasound guided injection is preferred based studies that show increased duration, increased effect, greater accuracy, decreased procedural pain, increased response rate with ultrasound guided versus blind injection.  Verbal  informed consent obtained.  Time-out conducted.  Noted no overlying erythema, induration, or other signs of local infection.  Skin prepped in a sterile fashion.  Local anesthesia: Topical Ethyl chloride.  With sterile technique and under real time ultrasound guidance:  Joint visualized.  21g 2 inch needle inserted posterior approach. Pictures taken for needle placement. Patient did have injection of 2 cc of 0.5% Marcaine, and 1cc of Kenalog 40 mg/dL. Completed without difficulty  Pain immediately resolved suggesting accurate placement of the medication.  Advised to call if fevers/chills, erythema, induration, drainage, or persistent bleeding.  Images permanently stored and available for review in the ultrasound unit.  Impression: Technically successful ultrasound guided injection.    Impression and Recommendations:     This case required medical decision making of moderate complexity. The above documentation has been reviewed and is  accurate and complete Lyndal Pulley, DO       Note: This dictation was prepared with Dragon dictation along with smaller phrase technology. Any transcriptional errors that result from this process are unintentional.

## 2019-02-05 NOTE — Assessment & Plan Note (Signed)
Repeat injection given today.  Tolerated the procedure well.  Discussed icing regimen, will try with topical anti-inflammatories given, encouraged range of motion exercises.  Worsening symptoms consider formal physical therapy with the possibility of PRP injections.  Follow-up again in 4 to 6 weeks

## 2019-02-05 NOTE — Patient Instructions (Signed)
Pennsaid 2x a day as needed [phone] See me again in 4 weeks

## 2019-02-18 ENCOUNTER — Encounter: Payer: Self-pay | Admitting: Gastroenterology

## 2019-02-18 ENCOUNTER — Ambulatory Visit: Payer: BC Managed Care – PPO | Admitting: Gastroenterology

## 2019-02-18 ENCOUNTER — Other Ambulatory Visit: Payer: Self-pay

## 2019-02-18 VITALS — BP 132/78 | HR 84 | Temp 97.0°F | Ht 66.0 in | Wt 277.0 lb

## 2019-02-18 DIAGNOSIS — K219 Gastro-esophageal reflux disease without esophagitis: Secondary | ICD-10-CM | POA: Diagnosis not present

## 2019-02-18 DIAGNOSIS — K449 Diaphragmatic hernia without obstruction or gangrene: Secondary | ICD-10-CM | POA: Diagnosis not present

## 2019-02-18 DIAGNOSIS — R194 Change in bowel habit: Secondary | ICD-10-CM

## 2019-02-18 DIAGNOSIS — K439 Ventral hernia without obstruction or gangrene: Secondary | ICD-10-CM | POA: Diagnosis not present

## 2019-02-18 DIAGNOSIS — K5730 Diverticulosis of large intestine without perforation or abscess without bleeding: Secondary | ICD-10-CM

## 2019-02-18 DIAGNOSIS — R140 Abdominal distension (gaseous): Secondary | ICD-10-CM

## 2019-02-18 MED ORDER — PANTOPRAZOLE SODIUM 40 MG PO TBEC: 40.0000 mg | DELAYED_RELEASE_TABLET | Freq: Two times a day (BID) | ORAL | 3 refills | Status: DC

## 2019-02-18 NOTE — Patient Instructions (Signed)
If you are age 70 or older, your body mass index should be between 23-30. Your Body mass index is 44.71 kg/m. If this is out of the aforementioned range listed, please consider follow up with your Primary Care Provider.  If you are age 58 or younger, your body mass index should be between 19-25. Your Body mass index is 44.71 kg/m. If this is out of the aformentioned range listed, please consider follow up with your Primary Care Provider.    You have been scheduled for an endoscopy. Please follow written instructions given to you at your visit today. If you use inhalers (even only as needed), please bring them with you on the day of your procedure.   We have sent the following medications to your pharmacy for you to pick up at your convenience: Pantoprazole  You will need to increase your Pantoprazole to twice daily. New Rx has been sent your pharmacy.   Thank you for choosing me and [facility].  Dr. Rush Landmark

## 2019-02-18 NOTE — Progress Notes (Signed)
[place] VISIT   Primary Care Provider Biagio Borg, MD [place] [address] [phone]  Referring Provider Biagio Borg, MD [address] [place],  [place] [address] [phone]  Patient Profile: Veronica [name] is a 68 y.o. female with a pmh significant for arthritis, diverticulosis, prior diverticulitis, status post cholecystectomy, hypertension, obesity, carpal tunnel, hemorrhoids, GERD, hiatal hernia (per prior EGD notation).  The patient presents to the [facility] for an evaluation and management of problem(s) noted below:  Problem List 1. Ventral hernia without obstruction or gangrene   2. Abdominal distention   3. Gastroesophageal reflux disease, esophagitis presence not specified   4. Hiatal hernia   5. Change in bowel habits   6. Diverticulosis of colon without hemorrhage     History of Present Illness This is the patient's first visit to the outpatient of our GI clinic.  She was previously followed by Dr. Earlean Shawl for many years.  On today's review of systems she has circled issues including abdominal pain, acid reflux/heartburn, belching, bloating, flatulence, chest pain, nausea, anal pain, change in bowel habits, constipation, diarrhea, diverticulosis, hemorrhoids, rectal bleeding.  I spoke with her at length this morning to discuss that it will take time for [place] to understand things for her in the long run and we will focus on her major issues of belching as well as abdominal protrusion, throat spit up, and GERD.  The patient states that over the last few months she has experienced a new protrusion near her navel region.  She underwent cross-sectional imaging with results as below that suggested a ventral hernia.  The patient states that in the morning she has lots of belching and stomach queasiness.  She has GERD on a near daily basis but as long as she takes her daily PPI she feels that her GERD  symptoms are relatively controlled.  She takes green tea in the mornings and this can help with some of the queasiness that she has in her abdomen.  She states she has some nausea but has not had any vomiting.  Patient states she has rectal urgency and will have 1 daily bowel movement per day.  Sometimes the bowel movements look like small pellets.  She has a prior diverticulosis with a bout of diverticulitis in 2016 that required IR drainage of an abscess.  She has not had surgical resection of her diverticulosis region.  She is not having any issues of rectal bleeding currently but does have a history of hemorrhoids per her report.  Has had previous upper and lower endoscopies.  GI Review of Systems Positive as above Negative for odynophagia, globus, decreased appetite, early satiety, melena  Review of Systems General: Denies fevers/chills/weight loss HEENT: Denies oral lesions Cardiovascular: Denies chest pain Pulmonary: Denies shortness of breath Gastroenterological: See HPI Genitourinary: Denies darkened urine Hematological: Denies easy bruising/bleeding Endocrine: Denies temperature intolerance Dermatological: Denies jaundice Psychological: Mood is stable   Medications Current Outpatient Medications  Medication Sig Dispense Refill   acetaminophen (TYLENOL) 650 MG CR tablet Take 650 mg by mouth every 8 (eight) hours as needed for pain.     amoxicillin (AMOXIL) 500 MG capsule Take 500 mg by mouth every 8 (eight) hours.     aspirin 81 MG EC tablet Take 1 tablet (81 mg total) by mouth daily. Swallow whole. (Patient taking differently: Take 81 mg by mouth every 14 (fourteen) days. Swallow whole.) 30 tablet 12  furosemide (LASIX) 20 MG tablet Take 1 tablet (20 mg total) by mouth daily as needed. 30 tablet 11   irbesartan-hydrochlorothiazide (AVALIDE) 150-12.5 MG tablet Take 2 tablets by mouth daily. 180 tablet 3   mirabegron ER (MYRBETRIQ) 25 MG TB24 tablet Take 25 mg by mouth  daily.     tolterodine (DETROL LA) 4 MG 24 hr capsule Take 4 mg by mouth daily.     traMADol (ULTRAM) 50 MG tablet take 1 tablet by mouth every 8 hours if needed 120 tablet 5   pantoprazole (PROTONIX) 40 MG tablet Take 1 tablet (40 mg total) by mouth 2 (two) times daily before a meal. 90 tablet 3   No current facility-administered medications for this visit.     Allergies Allergies  Allergen Reactions   Clarithromycin     Histories Past Medical History:  Diagnosis Date   Allergy    Arthritis    Broken ribs    hx of   Carpal tunnel syndrome 12/27/2010   Diverticulitis    GERD (gastroesophageal reflux disease)    Hemorrhoids    Hypertension    Past Surgical History:  Procedure Laterality Date   CHOLECYSTECTOMY  2000   SHOULDER SURGERY Right 1999   Social History   Socioeconomic History   Marital status: Married    Spouse name: Not on file   Number of children: Not on file   Years of education: 16   Highest education level: Not on file  Occupational History   Occupation: Aministrative support    Employer: [employer]  Social Needs   Financial resource strain: Not on file   Food insecurity    Worry: Not on file    Inability: Not on file   Transportation needs    Medical: Not on file    Non-medical: Not on file  Tobacco Use   Smoking status: Never Smoker   Smokeless tobacco: Never Used  Substance and Sexual Activity   Alcohol use: No   Drug use: No   Sexual activity: Not on file  Lifestyle   Physical activity    Days per week: Not on file    Minutes per session: Not on file   Stress: Not on file  Relationships   Social connections    Talks on phone: Not on file    Gets together: Not on file    Attends religious service: Not on file    Active member of club or organization: Not on file    Attends meetings of clubs or organizations: Not on file    Relationship status: Not on file   Intimate partner violence    Fear  of current or ex partner: Not on file    Emotionally abused: Not on file    Physically abused: Not on file    Forced sexual activity: Not on file  Other Topics Concern   Not on file  Social History Narrative   Not on file   Family History  Problem Relation Age of Onset   Arthritis Other    Alcohol abuse Other    Heart disease Other    Hypertension Other    Cancer Other    Heart disease Other    Colon cancer Neg Hx    Esophageal cancer Neg Hx    Inflammatory bowel disease Neg Hx    Liver disease Neg Hx    Pancreatic cancer Neg Hx    Rectal cancer Neg Hx    Stomach cancer Neg  Hx    I have reviewed her medical, social, and family history in detail and updated the electronic medical record as necessary.    PHYSICAL EXAMINATION  BP 132/78    Pulse 84    Temp (!) 97 F (36.1 C)    Ht 5\' 6"  (1.676 m)    Wt 277 lb (125.6 kg)    BMI 44.71 kg/m  Wt Readings from Last 3 Encounters:  02/18/19 277 lb (125.6 kg)  02/05/19 279 lb (126.6 kg)  01/14/19 284 lb (128.8 kg)  GEN: NAD, appears stated age, doesn't appear chronically ill PSYCH: Cooperative, without pressured speech EYE: Conjunctivae pink, sclerae anicteric ENT: MMM, without oral ulcers, no erythema or exudates noted NECK: Supple, enlarged neck girth CV: RR without R/Gs  RESP: Decreased breath sounds at the bases bilaterally but no adventitious sounds present GI: Obese, NABS, soft, NT, supraumbilical hernia present upon lying flat as well as increasing abdominal pressure, (easily reduces), without rebound or guarding, unable to appreciate hepatosplenomegaly as result of body habitus MSK/EXT: Trace bilateral lower extremity edema SKIN: No jaundice NEURO:  Alert & Oriented x 3, no focal deficits   REVIEW OF DATA  I reviewed the following data at the time of this encounter:  GI Procedures and Studies  2016 EGD Grade a erosive esophagitis Schatzki ring nonobstructing Hiatal hernia large  2016  colonoscopy No colorectal neoplasia noted. Left-sided diverticulosis moderate Internal hemorrhoids grade 2  Laboratory Studies  Reviewed those in epic  Imaging Studies  August 2020 CT abdomen and pelvis IMPRESSION: 1. Extensive descending colonic and sigmoid diverticulosis without diverticulitis. No bowel obstruction. No abscess in the abdomen or pelvis. Appendix region appears unremarkable. 2. Midline ventral hernia containing fat. A minimal amount of bowel is noted at the neck of the hernia without bowel compromise. 3.  Small hiatal hernia. 4. Small amount of fluid in the cul-de-sac which may be physiologic. 5.  Gallbladder absent. 6. No renal or ureteral calculus. No hydronephrosis. Urinary bladder wall thickness within normal limits.   ASSESSMENT  Ms. Rister is a 68 y.o. female with a pmh significant for arthritis, diverticulosis, prior diverticulitis, status post cholecystectomy, hypertension, obesity, carpal tunnel, hemorrhoids, GERD, hiatal hernia (per prior EGD notation).  The patient is seen today for evaluation and management of:  1. Ventral hernia without obstruction or gangrene   2. Abdominal distention   3. Gastroesophageal reflux disease, esophagitis presence not specified   4. Hiatal hernia   5. Change in bowel habits   6. Diverticulosis of colon without hemorrhage    The patient is hemodynamically stable.  She has multiple GI issues but we will tackle today seems to be her more significant symptom of having a abdominal protrusion near her umbilicus.  Her most recent cross-sectional imaging is suggested a ventral hernia and her physical exam suggest that as well.  I offered the patient a referral to surgery to discuss possible role of surgical intervention however, she wants to defer on this currently but it can be pursued at any time point in the future.  The patient has been having increased upper GI symptoms of abdominal queasiness and nausea with known GERD and a  large hiatal hernia based on previous outpatient, oxide, endoscopy.  I think a diagnostic endoscopy to evaluate and see what is going on and plan for esophageal/gastric/duodenal biopsies would be reasonable.  I would like to see how she does by increasing her PPI, she seems like she has had some benefit with her PCP  reinitiating that and so would like to see how twice daily dosing goes.  The patient agrees with this plan of action.  Her last colonoscopy was in 2016 and only showed diverticulosis.  After her colonoscopy she was hospitalized at [facility] with perforated diverticulitis but has not had a repeat colonoscopy since.  She defers on colonoscopy for now but if her symptoms of intermittent constipation/diarrhea persist then we may consider that.  We again will focus on increasing her fiber supplementation on a daily basis.  The risks and benefits of endoscopic evaluation were discussed with the patient; these include but are not limited to the risk of perforation, infection, bleeding, missed lesions, lack of diagnosis, severe illness requiring hospitalization, as well as anesthesia and sedation related illnesses.  The patient is agreeable to proceed.  All patient questions were answered, to the best of my ability, and the patient agrees to the aforementioned plan of action with follow-up as indicated.   PLAN  Increase PPI to 40 twice daily Proceed with diagnostic endoscopy (esophageal/gastric and possible duodenal biopsies) Evaluate hiatal hernia Increased fiber dietarily as well as supplement (FiberCon once daily) Holding on colonoscopy per patient request Holding on surgical referral per patient request though could consider for hernia evaluation   Orders Placed This Encounter  Procedures   Ambulatory referral to Gastroenterology    New Prescriptions   PANTOPRAZOLE (PROTONIX) 40 MG TABLET    Take 1 tablet (40 mg total) by mouth 2 (two) times daily before a meal.   Modified Medications   No  medications on file    Planned Follow Up No follow-ups on file.   Justice Britain, MD [facility] # [id number]

## 2019-02-19 DIAGNOSIS — R194 Change in bowel habit: Secondary | ICD-10-CM | POA: Insufficient documentation

## 2019-02-19 DIAGNOSIS — K449 Diaphragmatic hernia without obstruction or gangrene: Secondary | ICD-10-CM | POA: Insufficient documentation

## 2019-02-19 DIAGNOSIS — R140 Abdominal distension (gaseous): Secondary | ICD-10-CM | POA: Insufficient documentation

## 2019-02-19 DIAGNOSIS — K5730 Diverticulosis of large intestine without perforation or abscess without bleeding: Secondary | ICD-10-CM | POA: Insufficient documentation

## 2019-02-19 DIAGNOSIS — K439 Ventral hernia without obstruction or gangrene: Secondary | ICD-10-CM | POA: Insufficient documentation

## 2019-02-25 ENCOUNTER — Telehealth: Payer: Self-pay

## 2019-02-25 NOTE — Telephone Encounter (Signed)
Covid-19 screening questions   Do you now or have you had a fever in the last 14 days? NO   Do you have any respiratory symptoms of shortness of breath or cough now or in the last 14 days? NO  Do you have any family members or close contacts with diagnosed or suspected Covid-19 in the past 14 days? NO  Have you been tested for Covid-19 and found to be positive? NO        

## 2019-02-26 ENCOUNTER — Ambulatory Visit (AMBULATORY_SURGERY_CENTER): Payer: BC Managed Care – PPO | Admitting: Gastroenterology

## 2019-02-26 ENCOUNTER — Encounter: Payer: Self-pay | Admitting: Gastroenterology

## 2019-02-26 ENCOUNTER — Other Ambulatory Visit: Payer: Self-pay

## 2019-02-26 VITALS — BP 109/60 | HR 90 | Temp 98.2°F | Resp 30 | Ht 66.0 in | Wt 277.0 lb

## 2019-02-26 DIAGNOSIS — K2980 Duodenitis without bleeding: Secondary | ICD-10-CM | POA: Diagnosis not present

## 2019-02-26 DIAGNOSIS — K259 Gastric ulcer, unspecified as acute or chronic, without hemorrhage or perforation: Secondary | ICD-10-CM

## 2019-02-26 DIAGNOSIS — K2970 Gastritis, unspecified, without bleeding: Secondary | ICD-10-CM

## 2019-02-26 DIAGNOSIS — K439 Ventral hernia without obstruction or gangrene: Secondary | ICD-10-CM

## 2019-02-26 DIAGNOSIS — K449 Diaphragmatic hernia without obstruction or gangrene: Secondary | ICD-10-CM | POA: Diagnosis not present

## 2019-02-26 MED ORDER — SODIUM CHLORIDE 0.9 % IV SOLN: 500.0000 mL | Freq: Once | INTRAVENOUS | Status: DC

## 2019-02-26 NOTE — Progress Notes (Signed)
Called to room to assist during endoscopic procedure.  Patient ID and intended procedure confirmed with present staff. Received instructions for my participation in the procedure from the performing physician.  

## 2019-02-26 NOTE — Progress Notes (Signed)
Pt's states no medical or surgical changes since previsit or office visit.  June Bullock-temp Courtney Washington - vitals 

## 2019-02-26 NOTE — Op Note (Signed)
[facility] Patient Name: Veronica [name] Procedure Date: 02/26/2019 10:04 AM MRN: [id number] Endoscopist: Justice Britain , MD Age: 68 Referring MD:  Date of Birth: 1951-01-01 Gender: Female Account #: [id number] Procedure:                Upper GI endoscopy Indications:              Heartburn, Gastro-esophageal reflux disease,                            Follow-up of hiatal hernia, Change in bowel habits Medicines:                Monitored Anesthesia Care Procedure:                Pre-Anesthesia Assessment:                           - Prior to the procedure, a History and Physical                            was performed, and patient medications and                            allergies were reviewed. The patient's tolerance of                            previous anesthesia was also reviewed. The risks                            and benefits of the procedure and the sedation                            options and risks were discussed with the patient.                            All questions were answered, and informed consent                            was obtained. Prior Anticoagulants: The patient has                            taken no previous anticoagulant or antiplatelet                            agents except for aspirin. ASA Grade Assessment:                            III - A patient with severe systemic disease. After                            reviewing the risks and benefits, the patient was                            deemed in satisfactory condition to undergo the  procedure.                           After obtaining informed consent, the endoscope was                            passed under direct vision. Throughout the                            procedure, the patient's blood pressure, pulse, and                            oxygen saturations were monitored continuously. The                            Endoscope was introduced  through the mouth, and                            advanced to the second part of duodenum. The upper                            GI endoscopy was accomplished without difficulty.                            The patient tolerated the procedure. Scope In: Scope Out: Findings:                 No gross lesions were noted in the entire                            esophagus. Biopsies were taken with a cold forceps                            for histology.                           A small hiatal hernia was found. The proximal                            extent of the gastric folds (end of tubular                            esophagus) was 35 cm from the incisors. The hiatal                            narrowing was 38 cm from the incisors. The Z-line                            was 34 cm from the incisors.                           Multiple dispersed, small non-bleeding erosions                            were found in the gastric  antrum. There were no                            stigmata of recent bleeding.                           Patchy moderately erythematous mucosa without                            bleeding was found in the gastric body and in the                            gastric antrum.                           No gross lesions were noted in the entire examined                            stomach. Biopsies were taken with a cold forceps                            for histology and Helicobacter pylori testing.                           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 Biopsies were taken with a cold forceps                             for histology. Complications:            No immediate complications. Estimated Blood Loss:     Estimated blood loss was minimal. Impression:               - No gross lesions in esophagus. Biopsied.                           - Small hiatal hernia.                           - Non-bleeding erosive gastropathy. Erythematous  mucosa in the gastric body and antrum. No other                            gross lesions in the stomach. Biopsied.                           - Mucosal changes in the duodenum. Biopsied. Recommendation:           - The patient will be observed post-procedure,                            until all discharge criteria are met.                           - Discharge patient to home.                           - Patient has a contact number available for                            emergencies. The signs and symptoms of potential                            delayed complications were discussed with the                            patient. Return to normal activities tomorrow.                            Written discharge instructions were provided to the                            patient.                           - Resume previous diet.                           - Continue present medications.                           - Await pathology results.                           - The findings and recommendations were discussed                            with the patient. Justice Britain, MD 02/26/2019 10:27:06 AM

## 2019-02-26 NOTE — Patient Instructions (Addendum)
INFORMATION ON GASTRITIS GIVEN TO YOU TODAY  RESUME PREVIOUS DIET & MEDICATIONS   AWAIT BIOPSY RESULTS    YOU HAD AN ENDOSCOPIC PROCEDURE TODAY AT [facility]:   Refer to the procedure report that was given to you for any specific questions about what was found during the examination.  If the procedure report does not answer your questions, please call your gastroenterologist to clarify.  If you requested that your care partner not be given the details of your procedure findings, then the procedure report has been included in a sealed envelope for you to review at your convenience later.  YOU SHOULD EXPECT: Some feelings of bloating in the abdomen. Passage of more gas than usual.  Walking can help get rid of the air that was put into your GI tract during the procedure and reduce the bloating. If you had a lower endoscopy (such as a colonoscopy or flexible sigmoidoscopy) you may notice spotting of blood in your stool or on the toilet paper. If you underwent a bowel prep for your procedure, you may not have a normal bowel movement for a few days.  Please Note:  You might notice some irritation and congestion in your nose or some drainage.  This is from the oxygen used during your procedure.  There is no need for concern and it should clear up in a day or so.  SYMPTOMS TO REPORT IMMEDIATELY:     Following upper endoscopy (EGD)  Vomiting of blood or coffee ground material  New chest pain or pain under the shoulder blades  Painful or persistently difficult swallowing  New shortness of breath  Fever of 100F or higher  Black, tarry-looking stools  For urgent or emergent issues, a gastroenterologist can be reached at any hour by calling [phone].   DIET:  We do recommend a small meal at first, but then you may proceed to your regular diet.  Drink plenty of fluids but you should avoid alcoholic beverages for 24 hours.  ACTIVITY:  You should plan to take it easy for  the rest of today and you should NOT DRIVE or use heavy machinery until tomorrow (because of the sedation medicines used during the test).    FOLLOW UP: Our staff will call the number listed on your records 48-72 hours following your procedure to check on you and address any questions or concerns that you may have regarding the information given to you following your procedure. If we do not reach you, we will leave a message.  We will attempt to reach you two times.  During this call, we will ask if you have developed any symptoms of COVID 19. If you develop any symptoms (ie: fever, flu-like symptoms, shortness of breath, cough etc.) before then, please call [phone].  If you test positive for Covid 19 in the 2 weeks post procedure, please call and report this information to [place].    If any biopsies were taken you will be contacted by phone or by letter within the next 1-3 weeks.  Please call us at [phone] if you have not heard about the biopsies in 3 weeks.    SIGNATURES/CONFIDENTIALITY: You and/or your care partner have signed paperwork which will be entered into your electronic medical record.  These signatures attest to the fact that that the information above on your After Visit Summary has been reviewed and is understood.  Full responsibility of the confidentiality of this discharge information lies with you and/or your  care-partner.

## 2019-03-02 ENCOUNTER — Telehealth: Payer: Self-pay

## 2019-03-02 ENCOUNTER — Telehealth: Payer: Self-pay | Admitting: *Deleted

## 2019-03-02 NOTE — Telephone Encounter (Signed)
Second attempt follow up call, pt vm full.

## 2019-03-02 NOTE — Telephone Encounter (Signed)
  Follow up Call-  Call back number 02/26/2019  Post procedure Call Back phone  # [phone]  Permission to leave phone message Yes  Some recent data might be hidden     Patient questions:  Message left to call us if necessary.

## 2019-03-08 ENCOUNTER — Ambulatory Visit: Payer: BC Managed Care – PPO | Admitting: Family Medicine

## 2019-03-09 ENCOUNTER — Encounter: Payer: Self-pay | Admitting: Gastroenterology

## 2019-07-21 ENCOUNTER — Encounter: Payer: Self-pay | Admitting: Internal Medicine

## 2019-07-21 ENCOUNTER — Other Ambulatory Visit: Payer: Self-pay

## 2019-07-21 ENCOUNTER — Ambulatory Visit (INDEPENDENT_AMBULATORY_CARE_PROVIDER_SITE_OTHER): Payer: BC Managed Care – PPO | Admitting: Internal Medicine

## 2019-07-21 VITALS — BP 144/82 | HR 80 | Temp 98.1°F | Ht 66.0 in | Wt 292.6 lb

## 2019-07-21 DIAGNOSIS — G629 Polyneuropathy, unspecified: Secondary | ICD-10-CM | POA: Diagnosis not present

## 2019-07-21 DIAGNOSIS — N813 Complete uterovaginal prolapse: Secondary | ICD-10-CM | POA: Insufficient documentation

## 2019-07-21 DIAGNOSIS — I10 Essential (primary) hypertension: Secondary | ICD-10-CM | POA: Diagnosis not present

## 2019-07-21 DIAGNOSIS — R739 Hyperglycemia, unspecified: Secondary | ICD-10-CM | POA: Diagnosis not present

## 2019-07-21 DIAGNOSIS — M25512 Pain in left shoulder: Secondary | ICD-10-CM

## 2019-07-21 DIAGNOSIS — Z860100 Personal history of colon polyps, unspecified: Secondary | ICD-10-CM

## 2019-07-21 DIAGNOSIS — N8110 Cystocele, unspecified: Secondary | ICD-10-CM

## 2019-07-21 LAB — URINALYSIS, ROUTINE W REFLEX MICROSCOPIC
Bilirubin Urine: NEGATIVE
Hgb urine dipstick: NEGATIVE
Leukocytes,Ua: NEGATIVE
Nitrite: NEGATIVE
RBC / HPF: NONE SEEN (ref 0–?)
Specific Gravity, Urine: 1.025 (ref 1.000–1.030)
Urine Glucose: NEGATIVE
Urobilinogen, UA: 1 (ref 0.0–1.0)
pH: 6 (ref 5.0–8.0)

## 2019-07-21 LAB — VITAMIN B12: Vitamin B-12: 482 pg/mL (ref 211–911)

## 2019-07-21 LAB — HEMOGLOBIN A1C: Hgb A1c MFr Bld: 5.1 % (ref 4.6–6.5)

## 2019-07-21 MED ORDER — IRBESARTAN-HYDROCHLOROTHIAZIDE 150-12.5 MG PO TABS: 2.0000 | ORAL_TABLET | Freq: Every day | ORAL | 3 refills | Status: DC

## 2019-07-21 MED ORDER — MIRABEGRON ER 25 MG PO TB24: 25.0000 mg | ORAL_TABLET | Freq: Every day | ORAL | 3 refills | Status: DC

## 2019-07-21 MED ORDER — TOLTERODINE TARTRATE ER 4 MG PO CP24: 4.0000 mg | ORAL_CAPSULE | Freq: Every day | ORAL | 3 refills | Status: DC

## 2019-07-21 MED ORDER — FUROSEMIDE 20 MG PO TABS: 20.0000 mg | ORAL_TABLET | Freq: Every day | ORAL | 11 refills | Status: DC | PRN

## 2019-07-21 MED ORDER — TRAMADOL HCL 50 MG PO TABS: ORAL_TABLET | ORAL | 5 refills | Status: DC

## 2019-07-21 NOTE — Patient Instructions (Signed)
Please continue all other medications as before, and refills have been done if requested.  Please have the pharmacy call with any other refills you may need.  Please continue your efforts at being more active, low cholesterol diet, and weight control.  Please keep your appointments with your specialists as you may have planned  You will be contacted regarding the referral for: Dr Louanna Raw, Dr Matilde Sprang, and please make appt with Dr Tamala Julian at the firs floor for the left shoulder  Please go to the LAB at the blood drawing area for the tests to be done - just the few tests today  You will be contacted by phone if any changes need to be made immediately.  Otherwise, you will receive a letter about your results with an explanation, but please check with MyChart first.  Please remember to sign up for MyChart if you have not done so, as this will be important to you in the future with finding out test results, communicating by private email, and scheduling acute appointments online when needed.  Please make an Appointment to return in 6 months, or sooner if needed

## 2019-07-21 NOTE — Assessment & Plan Note (Signed)
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

## 2019-07-21 NOTE — Assessment & Plan Note (Addendum)
For b12, cont current tx  I spent 40 minutes preparing to see the patient by review of recent labs, imaging and procedures, obtaining and reviewing separately obtained history, communicating with the patient and family or caregiver, ordering medications, tests or procedures, and documenting clinical information in the EHR including the differential Dx, treatment, and any further evaluation and other management of neuropathy, left shoulder pain,  Hyperglycemia, HTN, hx colon polyps, bladder prolapse

## 2019-07-21 NOTE — Assessment & Plan Note (Signed)
Also for urology referral 

## 2019-07-21 NOTE — Assessment & Plan Note (Signed)
stable overall by history and exam, recent data reviewed with pt, and pt to continue medical treatment as before,  to f/u any worsening symptoms or concerns  

## 2019-07-21 NOTE — Progress Notes (Signed)
Subjective:    Patient ID: Veronica [name], female    DOB: [birth year], 69 y.o.   MRN: [id number]  HPI  Here with c/o persistent bilat LE numbness, declines NCS, did not take the gabapentin for peripheral neuropathy after olloked at side effects.   Pt denies new neurological symptoms such as new headache, or facial or extremity weakness.  Denies urinary symptoms such as dysuria, frequency, urgency, flank pain, hematuria or n/v, fever, chills, but does have worsening bladder prolapse and asks to see Dr Matilde Sprang urology again.  Also noted small bright red blood on underwear and thinks maybe urinary related but cant r/o BRBPR, due for colonoscopy.  Also has left upper arm/shoulder anterior pain without tenderness and with FROM, asks for referral to Dr Tamala Julian.   Past Medical History:  Diagnosis Date  . Allergy   . Arthritis   . Broken ribs    hx of  . Carpal tunnel syndrome 12/27/2010  . Diverticulitis   . GERD (gastroesophageal reflux disease)   . Hemorrhoids   . Hypertension    Past Surgical History:  Procedure Laterality Date  . CHOLECYSTECTOMY  2000  . SHOULDER SURGERY Right 1999    reports that she has never smoked. She has never used smokeless tobacco. She reports that she does not drink alcohol or use drugs. family history includes Alcohol abuse in an other family member; Arthritis in an other family member; Cancer in an other family member; Heart disease in some other family members; Hypertension in an other family member. Allergies  Allergen Reactions  . Clarithromycin    Current Outpatient Medications on File Prior to Visit  Medication Sig Dispense Refill  . acetaminophen (TYLENOL) 650 MG CR tablet Take 650 mg by mouth every 8 (eight) hours as needed for pain.    . pantoprazole (PROTONIX) 40 MG tablet Take 1 tablet (40 mg total) by mouth 2 (two) times daily before a meal. 90 tablet 3  . aspirin 81 MG EC tablet Take 1 tablet (81 mg total) by mouth daily. Swallow whole.  (Patient not taking: Reported on 07/21/2019) 30 tablet 12   No current facility-administered medications on file prior to visit.   Review of Systems All otherwise neg per pt     Objective:   Physical Exam BP (!) 144/82 (BP Location: Left Arm, Cuff Size: Large)   Pulse 80   Temp 98.1 F (36.7 C)   Ht 5\' 6"  (1.676 m)   Wt 292 lb 9.6 oz (132.7 kg)   SpO2 99%   BMI 47.23 kg/m  VS noted,  Constitutional: Pt appears in NAD HENT: Head: NCAT.  Right Ear: External ear normal.  Left Ear: External ear normal.  Eyes: . Pupils are equal, round, and reactive to light. Conjunctivae and EOM are normal Nose: without d/c or deformity Neck: Neck supple. Gross normal ROM Cardiovascular: Normal rate and regular rhythm.   Pulmonary/Chest: Effort normal and breath sounds without rales or wheezing.  Abd:  Soft, NT, ND, + BS, no organomegaly Left shoulder NT and FROM Neurological: Pt is alert. At baseline orientation, motor grossly intact Skin: Skin is warm. No rashes, other new lesions, no LE edema Psychiatric: Pt behavior is normal without agitation  All otherwise neg per pt Lab Results  Component Value Date   WBC 7.9 01/07/2019   HGB 13.2 01/07/2019   HCT 39.7 01/07/2019   PLT 226.0 01/07/2019   GLUCOSE 99 01/07/2019   CHOL 148 01/07/2019   TRIG 61.0 01/07/2019  HDL 48.20 01/07/2019   LDLCALC 88 01/07/2019   ALT 11 01/07/2019   AST 15 01/07/2019   NA 139 01/07/2019   K 3.5 01/07/2019   CL 102 01/07/2019   CREATININE 0.81 01/07/2019   BUN 16 01/07/2019   CO2 29 01/07/2019   TSH 3.99 01/07/2019   INR 1.23 03/28/2015   HGBA1C 5.1 07/21/2019          Assessment & Plan:

## 2019-07-21 NOTE — Assessment & Plan Note (Signed)
Also for colonoscopy,  to f/u any worsening symptoms or concerns  

## 2019-07-21 NOTE — Assessment & Plan Note (Signed)
Etiology unclear, exam benign, for sport med referral

## 2019-07-28 ENCOUNTER — Encounter: Payer: Self-pay | Admitting: Family Medicine

## 2019-07-28 ENCOUNTER — Ambulatory Visit: Payer: BC Managed Care – PPO | Admitting: Family Medicine

## 2019-07-28 ENCOUNTER — Encounter: Payer: Self-pay | Admitting: Gastroenterology

## 2019-07-28 ENCOUNTER — Other Ambulatory Visit: Payer: Self-pay

## 2019-07-28 ENCOUNTER — Telehealth: Payer: Self-pay | Admitting: Gastroenterology

## 2019-07-28 ENCOUNTER — Ambulatory Visit (INDEPENDENT_AMBULATORY_CARE_PROVIDER_SITE_OTHER): Payer: BC Managed Care – PPO

## 2019-07-28 VITALS — BP 140/98 | HR 90 | Ht 66.0 in | Wt 288.0 lb

## 2019-07-28 DIAGNOSIS — M25512 Pain in left shoulder: Secondary | ICD-10-CM | POA: Diagnosis not present

## 2019-07-28 DIAGNOSIS — M25561 Pain in right knee: Secondary | ICD-10-CM | POA: Diagnosis not present

## 2019-07-28 DIAGNOSIS — G8929 Other chronic pain: Secondary | ICD-10-CM

## 2019-07-28 DIAGNOSIS — M545 Low back pain, unspecified: Secondary | ICD-10-CM | POA: Insufficient documentation

## 2019-07-28 DIAGNOSIS — M75102 Unspecified rotator cuff tear or rupture of left shoulder, not specified as traumatic: Secondary | ICD-10-CM

## 2019-07-28 DIAGNOSIS — M25562 Pain in left knee: Secondary | ICD-10-CM

## 2019-07-28 DIAGNOSIS — M12812 Other specific arthropathies, not elsewhere classified, left shoulder: Secondary | ICD-10-CM

## 2019-07-28 NOTE — Telephone Encounter (Signed)
Dr. Rush Landmark, pt is referred for a colonoscopy, hx of polyps.  Pt's previous 2016 colonoscopy records are in [place].  Please review and advise recall.

## 2019-07-28 NOTE — Progress Notes (Signed)
[facility] [place] Phone: [phone] Subjective:   [name], am serving as a scribe for Dr. Hulan Saas. This visit occurred during the SARS-CoV-2 public health emergency.  Safety protocols were in place, including screening questions prior to the visit, additional usage of staff PPE, and extensive cleaning of exam room while observing appropriate contact time as indicated for disinfecting solutions.   I'm seeing this patient by the request  of:  Veronica Borg, MD  CC: Shoulder pain follow-up  [id number]   02/05/2019 Repeat injection given today.  Tolerated the procedure well.  Discussed icing regimen, will try with topical anti-inflammatories given, encouraged range of motion exercises.  Worsening symptoms consider formal physical therapy with the possibility of PRP injections.  Follow-up again in 4 to 6 weeks  Update 07/28/2019 Veronica [name] is a 69 y.o. female coming in with complaint of left shoulder pain. Patient states that her pain increased over the past 3 weeks. Pain over bicep tendon and into her upper arm. Pain is achy especially when holding onto a plate or something in her left hand. Has been using Tylenol or Advil prn.  No rotator cuff arthropathy, has been using oral anti-inflammatories more recently.  Last injection was 6 months ago.  Starting affect daily activities at the moment.     Past Medical History:  Diagnosis Date  . Allergy   . Arthritis   . Broken ribs    hx of  . Carpal tunnel syndrome 12/27/2010  . Diverticulitis   . GERD (gastroesophageal reflux disease)   . Hemorrhoids   . Hypertension    Past Surgical History:  Procedure Laterality Date  . CHOLECYSTECTOMY  2000  . SHOULDER SURGERY Right 1999   Social History   Socioeconomic History  . Marital status: Married    Spouse name: Not on file  . Number of children: Not on file  . Years of education: 65  . Highest  education level: Not on file  Occupational History  . Occupation: Therapist, art: [employer]  Tobacco Use  . Smoking status: Never Smoker  . Smokeless tobacco: Never Used  Substance and Sexual Activity  . Alcohol use: No  . Drug use: No  . Sexual activity: Not on file  Other Topics Concern  . Not on file  Social History Narrative  . Not on file   Social Determinants of Health   Financial Resource Strain:   . Difficulty of Paying Living Expenses: Not on file  Food Insecurity:   . Worried About Charity fundraiser in the Last Year: Not on file  . Ran Out of Food in the Last Year: Not on file  Transportation Needs:   . Lack of Transportation (Medical): Not on file  . Lack of Transportation (Non-Medical): Not on file  Physical Activity:   . Days of Exercise per Week: Not on file  . Minutes of Exercise per Session: Not on file  Stress:   . Feeling of Stress : Not on file  Social Connections:   . Frequency of Communication with Friends and Family: Not on file  . Frequency of Social Gatherings with Friends and Family: Not on file  . Attends Religious Services: Not on file  . Active Member of Clubs or Organizations: Not on file  . Attends Archivist Meetings: Not on file  . Marital Status: Not on file   Allergies  Allergen Reactions  . Clarithromycin    Family History  Problem Relation Age of Onset  . Arthritis Other   . Alcohol abuse Other   . Heart disease Other   . Hypertension Other   . Cancer Other   . Heart disease Other   . Colon cancer Neg Hx   . Esophageal cancer Neg Hx   . Inflammatory bowel disease Neg Hx   . Liver disease Neg Hx   . Pancreatic cancer Neg Hx   . Rectal cancer Neg Hx   . Stomach cancer Neg Hx      Current Outpatient Medications (Cardiovascular):  .  furosemide (LASIX) 20 MG tablet, Take 1 tablet (20 mg total) by mouth daily as needed. .  irbesartan-hydrochlorothiazide (AVALIDE) 150-12.5 MG tablet,  Take 2 tablets by mouth daily.   Current Outpatient Medications (Analgesics):  .  acetaminophen (TYLENOL) 650 MG CR tablet, Take 650 mg by mouth every 8 (eight) hours as needed for pain. Marland Kitchen  aspirin 81 MG EC tablet, Take 1 tablet (81 mg total) by mouth daily. Swallow whole. .  traMADol (ULTRAM) 50 MG tablet, take 1 tablet by mouth every 8 hours if needed   Current Outpatient Medications (Other):  .  mirabegron ER (MYRBETRIQ) 25 MG TB24 tablet, Take 1 tablet (25 mg total) by mouth daily. .  pantoprazole (PROTONIX) 40 MG tablet, Take 1 tablet (40 mg total) by mouth 2 (two) times daily before a meal. .  tolterodine (DETROL LA) 4 MG 24 hr capsule, Take 1 capsule (4 mg total) by mouth daily.   Reviewed prior external information including notes and imaging from  primary care provider As well as notes that were available from care everywhere and other healthcare systems.  Past medical history, social, surgical and family history all reviewed in electronic medical record.  No pertanent information unless stated regarding to the chief complaint.   Review of Systems:  No headache, visual changes, nausea, vomiting, diarrhea, constipation, dizziness, abdominal pain, skin rash, fevers, chills, night sweats, weight loss, swollen lymph nodes, body aches, joint swelling, chest pain, shortness of breath, mood changes. POSITIVE muscle aches  Objective  Blood pressure (!) 140/98, pulse 90, height 5\' 6"  (1.676 m), weight 288 lb (130.6 kg), SpO2 95 %.   General: No apparent distress alert and oriented x3 mood and affect normal, dressed appropriately.  HEENT: Pupils equal, extraocular movements intact  Respiratory: Patient's speak in full sentences and does not appear short of breath  Cardiovascular: 2+ lower extremity edema, non tender, no erythema  Skin: Warm dry intact with no signs of infection or rash on extremities or on axial skeleton.  Abdomen: Soft nontender  Neuro: Cranial nerves II through XII  are intact, neurovascularly intact in all extremities with 2+ DTRs and 2+ pulses.  Lymph: No lymphadenopathy of posterior or anterior cervical chain or axillae bilaterally.  Gait antalgic walking with the aid of a cane MSK: Left shoulder exam shows the patient does have some mild atrophy of the shoulder musculature.  Patient does have some mild loss of range of motion.  Tender to palpation diffusely.  Mild crepitus noted with range of motion.  4-5 strength of the rotator cuff compared to contralateral side  Low back exam does have some mild loss of lordosis.  Tender to palpation of the sacroiliac joints.  Difficult to do range of motion exercises secondary to patient's body habitus as well as other osteoarthritic changes.  Procedure: Real-time Ultrasound Guided Injection of left glenohumeral joint  Device: GE Logiq E  Ultrasound guided injection is preferred based studies that show increased duration, increased effect, greater accuracy, decreased procedural pain, increased response rate with ultrasound guided versus blind injection.  Verbal informed consent obtained.  Time-out conducted.  Noted no overlying erythema, induration, or other signs of local infection.  Skin prepped in a sterile fashion.  Local anesthesia: Topical Ethyl chloride.  With sterile technique and under real time ultrasound guidance:  Joint visualized.  21g 2 inch needle inserted posterior approach. Pictures taken for needle placement. Patient did have injection of 2 cc of 0.5% Marcaine, and 1cc of Kenalog 40 mg/dL. Completed without difficulty  Pain immediately resolved suggesting accurate placement of the medication.  Advised to call if fevers/chills, erythema, induration, drainage, or persistent bleeding.  Images permanently stored and available for review in the ultrasound unit.  Impression: Technically successful ultrasound guided injection.    Impression and Recommendations:     This case required medical decision  making of moderate complexity. The above documentation has been reviewed and is accurate and complete Lyndal Pulley, DO       Note: This dictation was prepared with Dragon dictation along with smaller phrase technology. Any transcriptional errors that result from this process are unintentional.

## 2019-07-28 NOTE — Assessment & Plan Note (Signed)
Sacroiliac Joint Mobilization and Rehab 1. Work on pretzel stretching, shoulder back and leg draped in front. 3-5 sets, 30 sec.. 2. hip abductor rotations. standing, hip flexion and rotation outward then inward. 3 sets, 15 reps. when can do comfortably, add ankle weights starting at 2 pounds.  3. cross over stretching - shoulder back to ground, same side leg crossover. 3-5 sets for 30 min..  4. rolling up and back knees to chest and rocking. 5. sacral tilt - 5 sets, hold for 5-10 seconds

## 2019-07-28 NOTE — Assessment & Plan Note (Signed)
Changes patient ambulation is having more discomfort on the back.  Discussed with patient continue to monitor.  Follow-up again in 4 to 8 weeks

## 2019-07-28 NOTE — Patient Instructions (Addendum)
Injected shoulder Posture when working Mirilax 17g & Colace 100mg  daily for one week Stay active If back pain is not better, See me again in 8 weeks

## 2019-07-28 NOTE — Assessment & Plan Note (Addendum)
Chronic problem with mild exacerbation repeat injection given today, tolerated the procedure well, discussed which activities to do which wants to avoid.  Patient is to increase activity as tolerated.  Discussed icing regimen.  Follow-up in 4 to 8 weeks if shoulder pain is doing relatively well can follow-up with me again in 3 to 6 months.  Topical anti-inflammatory trial given.  Due to patient's other comorbidities and social determinants of health with difficulty with transportation this increased the complexity of this office visit.

## 2019-07-28 NOTE — Telephone Encounter (Signed)
We had previously discussed with the patient the colonoscopy. She deferred on this. If she is willing to move forward with it at this time she can be scheduled. If she is not sure, then please have her make a clinic appointment to further discuss. Thank you. GM

## 2019-08-23 ENCOUNTER — Other Ambulatory Visit: Payer: Self-pay | Admitting: Gastroenterology

## 2019-08-24 ENCOUNTER — Ambulatory Visit (AMBULATORY_SURGERY_CENTER): Payer: Self-pay | Admitting: *Deleted

## 2019-08-24 ENCOUNTER — Other Ambulatory Visit: Payer: Self-pay

## 2019-08-24 VITALS — Temp 97.3°F | Ht 66.0 in | Wt 283.0 lb

## 2019-08-24 DIAGNOSIS — R194 Change in bowel habit: Secondary | ICD-10-CM

## 2019-08-24 NOTE — Progress Notes (Signed)

## 2019-09-02 ENCOUNTER — Encounter: Payer: Self-pay | Admitting: Gastroenterology

## 2019-09-07 ENCOUNTER — Other Ambulatory Visit: Payer: Self-pay

## 2019-09-07 ENCOUNTER — Ambulatory Visit (AMBULATORY_SURGERY_CENTER): Payer: BC Managed Care – PPO | Admitting: Gastroenterology

## 2019-09-07 ENCOUNTER — Encounter: Payer: Self-pay | Admitting: Gastroenterology

## 2019-09-07 VITALS — BP 109/60 | HR 85 | Temp 97.3°F | Resp 23 | Ht 66.0 in | Wt 283.0 lb

## 2019-09-07 DIAGNOSIS — K641 Second degree hemorrhoids: Secondary | ICD-10-CM

## 2019-09-07 DIAGNOSIS — D122 Benign neoplasm of ascending colon: Secondary | ICD-10-CM

## 2019-09-07 DIAGNOSIS — K5730 Diverticulosis of large intestine without perforation or abscess without bleeding: Secondary | ICD-10-CM

## 2019-09-07 DIAGNOSIS — K635 Polyp of colon: Secondary | ICD-10-CM

## 2019-09-07 DIAGNOSIS — R194 Change in bowel habit: Secondary | ICD-10-CM

## 2019-09-07 MED ORDER — SODIUM CHLORIDE 0.9 % IV SOLN: 500.0000 mL | Freq: Once | INTRAVENOUS | Status: DC

## 2019-09-07 NOTE — Progress Notes (Signed)
pt tolerated well. VSS. awake and to recovery. Report given to RN. Pt with PIV interstittial. Positional and not completely threaded. # 22 to left wrist inserted with ease. Pt tolerated well and remained asleep.

## 2019-09-07 NOTE — Patient Instructions (Signed)
Handouts on polyps, diverticulosis, hemorrhoids, and high fiber diet given to you today Await pathology results Begin FiberCon 1 tablet by mouth daily    YOU HAD AN ENDOSCOPIC PROCEDURE TODAY AT [place]:   Refer to the procedure report that was given to you for any specific questions about what was found during the examination.  If the procedure report does not answer your questions, please call your gastroenterologist to clarify.  If you requested that your care partner not be given the details of your procedure findings, then the procedure report has been included in a sealed envelope for you to review at your convenience later.  YOU SHOULD EXPECT: Some feelings of bloating in the abdomen. Passage of more gas than usual.  Walking can help get rid of the air that was put into your GI tract during the procedure and reduce the bloating. If you had a lower endoscopy (such as a colonoscopy or flexible sigmoidoscopy) you may notice spotting of blood in your stool or on the toilet paper. If you underwent a bowel prep for your procedure, you may not have a normal bowel movement for a few days.  Please Note:  You might notice some irritation and congestion in your nose or some drainage.  This is from the oxygen used during your procedure.  There is no need for concern and it should clear up in a day or so.  SYMPTOMS TO REPORT IMMEDIATELY:   Following lower endoscopy (colonoscopy or flexible sigmoidoscopy):  Excessive amounts of blood in the stool  Significant tenderness or worsening of abdominal pains  Swelling of the abdomen that is new, acute  Fever of 100F or higher  For urgent or emergent issues, a gastroenterologist can be reached at any hour by calling [phone]. Do not use MyChart messaging for urgent concerns.    DIET:  We do recommend a small meal at first, but then you may proceed to your regular diet.  Drink plenty of fluids but you should avoid alcoholic  beverages for 24 hours.  ACTIVITY:  You should plan to take it easy for the rest of today and you should NOT DRIVE or use heavy machinery until tomorrow (because of the sedation medicines used during the test).    FOLLOW UP: Our staff will call the number listed on your records 48-72 hours following your procedure to check on you and address any questions or concerns that you may have regarding the information given to you following your procedure. If we do not reach you, we will leave a message.  We will attempt to reach you two times.  During this call, we will ask if you have developed any symptoms of COVID 19. If you develop any symptoms (ie: fever, flu-like symptoms, shortness of breath, cough etc.) before then, please call [phone].  If you test positive for Covid 19 in the 2 weeks post procedure, please call and report this information to [place].    If any biopsies were taken you will be contacted by phone or by letter within the next 1-3 weeks.  Please call us at [phone] if you have not heard about the biopsies in 3 weeks.    SIGNATURES/CONFIDENTIALITY: You and/or your care partner have signed paperwork which will be entered into your electronic medical record.  These signatures attest to the fact that that the information above on your After Visit Summary has been reviewed and is understood.  Full responsibility of the confidentiality of this discharge  information lies with you and/or your care-partner. 

## 2019-09-07 NOTE — Progress Notes (Signed)
Called to room to assist during endoscopic procedure.  Patient ID and intended procedure confirmed with present staff. Received instructions for my participation in the procedure from the performing physician.  

## 2019-09-07 NOTE — Progress Notes (Signed)
Pt's states no medical or surgical changes since previsit or office visit.  Temp taken by [facility] VS taken by [facility]

## 2019-09-07 NOTE — Op Note (Signed)
[place] Patient Name: Veronica [name] Procedure Date: 09/07/2019 9:03 AM MRN: [id number] Endoscopist: Justice Britain , MD Age: 69 Referring MD:  Date of Birth: 24-Jan-1951 Gender: Female Account #: [id number] Procedure:                Colonoscopy Indications:              Screening for colorectal malignant neoplasm;                            history of perforated diverticulitis Medicines:                Monitored Anesthesia Care Procedure:                Pre-Anesthesia Assessment:                           - Prior to the procedure, a History and Physical                            was performed, and patient medications and                            allergies were reviewed. The patient's tolerance of                            previous anesthesia was also reviewed. The risks                            and benefits of the procedure and the sedation                            options and risks were discussed with the patient.                            All questions were answered, and informed consent                            was obtained. Prior Anticoagulants: The patient has                            taken no previous anticoagulant or antiplatelet                            agents except for aspirin. ASA Grade Assessment: II                            - A patient with mild systemic disease. After                            reviewing the risks and benefits, the patient was                            deemed in satisfactory condition to undergo the  procedure.                           After obtaining informed consent, the colonoscope                            was passed under direct vision. Throughout the                            procedure, the patient's blood pressure, pulse, and                            oxygen saturations were monitored continuously. The                            Colonoscope was introduced through the anus and                          advanced to the 4 cm into the ileum. The                            colonoscopy was performed without difficulty. The                            patient tolerated the procedure. The quality of the                            bowel preparation was good. The terminal ileum,                            ileocecal valve, appendiceal orifice, and rectum                            were photographed. Scope In: 9:09:12 AM Scope Out: 9:24:37 AM Scope Withdrawal Time: 0 hours 11 minutes 13 seconds  Total Procedure Duration: 0 hours 15 minutes 25 seconds  Findings:                 The digital rectal exam findings include                            hemorrhoids. Pertinent negatives include no                            palpable rectal lesions.                           The terminal ileum and ileocecal valve appeared                            normal.                           A 3 mm polyp was found in the ascending colon. The  polyp was sessile. The polyp was removed with a                            cold snare. Resection and retrieval were complete.                           Many small and large-mouthed diverticula were found                            in the recto-sigmoid colon, sigmoid colon and                            descending colon.                           Normal mucosa was found in the entire colon                            otherwise.                           Non-bleeding non-thrombosed internal hemorrhoids                            were found during retroflexion, during perianal                            exam and during digital exam. The hemorrhoids were                            Grade II (internal hemorrhoids that prolapse but                            reduce spontaneously). Complications:            No immediate complications. Estimated Blood Loss:     Estimated blood loss was minimal. Impression:               - Hemorrhoids found  on digital rectal exam.                           - The examined portion of the ileum was normal.                           - One 3 mm polyp in the ascending colon, removed                            with a cold snare. Resected and retrieved.                           - Diverticulosis in the recto-sigmoid colon, in the                            sigmoid colon and in the descending colon.                           -  Normal mucosa in the entire examined colon                            otherwise.                           - Non-bleeding non-thrombosed internal hemorrhoids. Recommendation:           - The patient will be observed post-procedure,                            until all discharge criteria are met.                           - Discharge patient to home.                           - Patient has a contact number available for                            emergencies. The signs and symptoms of potential                            delayed complications were discussed with the                            patient. Return to normal activities tomorrow.                            Written discharge instructions were provided to the                            patient.                           - High fiber diet.                           - Use FiberCon 1 tablet PO daily.                           - Continue present medications.                           - Await pathology results.                           - Repeat colonoscopy in 7/10 years for surveillance                            based on pathology results and findings of                            adenomatous tissue.                           - The findings and recommendations were discussed  with the patient. Justice Britain, MD 09/07/2019 9:30:46 AM

## 2019-09-09 ENCOUNTER — Telehealth: Payer: Self-pay | Admitting: *Deleted

## 2019-09-09 NOTE — Telephone Encounter (Signed)
  Follow up Call-  Call back number 09/07/2019 02/26/2019  Post procedure Call Back phone  # [phone]  Permission to leave phone message Yes Yes  Some recent data might be hidden     Patient questions:  Do you have a fever, pain , or abdominal swelling? No. Pain Score  0 *  Have you tolerated food without any problems? Yes.    Have you been able to return to your normal activities? Yes.    Do you have any questions about your discharge instructions: Diet   No. Medications  No. Follow up visit  No.  Do you have questions or concerns about your Care? No.  Actions: * If pain score is 4 or above: No action needed, pain <4.  1. Have you developed a fever since your procedure? no  2.   Have you had an respiratory symptoms (SOB or cough) since your procedure? no  3.   Have you tested positive for COVID 19 since your procedure no  4.   Have you had any family members/close contacts diagnosed with the COVID 19 since your procedure?  no   If yes to any of these questions please route to Joylene John, RN and Erenest Rasher, RN

## 2019-09-12 ENCOUNTER — Encounter: Payer: Self-pay | Admitting: Gastroenterology

## 2019-09-16 ENCOUNTER — Other Ambulatory Visit (HOSPITAL_COMMUNITY)
Admission: RE | Admit: 2019-09-16 | Discharge: 2019-09-16 | Disposition: A | Discharge status: Home / Self Care | Payer: BC Managed Care – PPO | Source: Ambulatory Visit | Attending: Obstetrics and Gynecology | Admitting: Obstetrics and Gynecology

## 2019-09-16 ENCOUNTER — Ambulatory Visit (INDEPENDENT_AMBULATORY_CARE_PROVIDER_SITE_OTHER): Payer: BC Managed Care – PPO | Admitting: Obstetrics and Gynecology

## 2019-09-16 ENCOUNTER — Other Ambulatory Visit: Payer: Self-pay

## 2019-09-16 ENCOUNTER — Encounter: Payer: Self-pay | Admitting: Obstetrics and Gynecology

## 2019-09-16 VITALS — BP 154/82 | HR 90 | Temp 96.8°F | Resp 22 | Ht 62.0 in | Wt 282.2 lb

## 2019-09-16 DIAGNOSIS — Z124 Encounter for screening for malignant neoplasm of cervix: Secondary | ICD-10-CM | POA: Diagnosis not present

## 2019-09-16 DIAGNOSIS — N950 Postmenopausal bleeding: Secondary | ICD-10-CM | POA: Diagnosis not present

## 2019-09-16 DIAGNOSIS — N814 Uterovaginal prolapse, unspecified: Secondary | ICD-10-CM | POA: Diagnosis not present
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
## 2019-09-17 LAB — CYTOLOGY - PAP: Diagnosis: NEGATIVE

## 2019-09-20 ENCOUNTER — Telehealth: Payer: Self-pay | Admitting: Obstetrics and Gynecology

## 2019-09-20 NOTE — Telephone Encounter (Signed)
Call to patient. Per DPR, OK to leave message on voicemail.   Left voicemail requesting a return call to Hayley to review benefits and schedule recommended Pelvic ultrasound with Brook A. Silva, MD, FACOG 

## 2019-09-21 NOTE — Telephone Encounter (Signed)
Spoke with patient regarding benefits for recommended ultrasound and endometrial biopsy. Patient is aware that ultrasound is transvaginal. Patient acknowledges understanding of information presented. Patient is aware of cancellation policy. Encounter closed.

## 2019-09-22 ENCOUNTER — Ambulatory Visit: Payer: BC Managed Care – PPO | Admitting: Family Medicine

## 2019-09-30 ENCOUNTER — Other Ambulatory Visit: Payer: BC Managed Care – PPO | Admitting: Obstetrics and Gynecology

## 2019-09-30 ENCOUNTER — Other Ambulatory Visit: Payer: BC Managed Care – PPO

## 2019-10-06 ENCOUNTER — Other Ambulatory Visit: Payer: Self-pay

## 2019-10-07 ENCOUNTER — Encounter: Payer: Self-pay | Admitting: Obstetrics and Gynecology

## 2019-10-07 ENCOUNTER — Ambulatory Visit (INDEPENDENT_AMBULATORY_CARE_PROVIDER_SITE_OTHER): Payer: BC Managed Care – PPO

## 2019-10-07 ENCOUNTER — Ambulatory Visit: Payer: BC Managed Care – PPO | Admitting: Obstetrics and Gynecology

## 2019-10-07 ENCOUNTER — Other Ambulatory Visit (HOSPITAL_COMMUNITY)
Admission: RE | Admit: 2019-10-07 | Discharge: 2019-10-07 | Disposition: A | Discharge status: Home / Self Care | Payer: BC Managed Care – PPO | Source: Ambulatory Visit | Attending: Obstetrics and Gynecology | Admitting: Obstetrics and Gynecology

## 2019-10-07 VITALS — BP 158/98 | HR 96 | Temp 97.7°F | Wt 284.2 lb

## 2019-10-07 DIAGNOSIS — N9489 Other specified conditions associated with female genital organs and menstrual cycle: Secondary | ICD-10-CM | POA: Diagnosis not present

## 2019-10-07 DIAGNOSIS — N950 Postmenopausal bleeding: Secondary | ICD-10-CM | POA: Insufficient documentation

## 2019-10-07 NOTE — Patient Instructions (Signed)

## 2019-10-07 NOTE — Progress Notes (Signed)
Encounter reviewed by Dr. Kao Berkheimer Amundson C. Silva.  
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
## 2019-10-08 LAB — CA 125: Cancer Antigen (CA) 125: 11.7 U/mL (ref 0.0–38.1)

## 2019-10-11 LAB — SURGICAL PATHOLOGY

## 2019-10-15 ENCOUNTER — Telehealth: Payer: Self-pay | Admitting: *Deleted

## 2019-10-15 NOTE — Telephone Encounter (Signed)
Burnice Logan, RN  10/15/2019 10:27 AM EDT    Left message to call Sharee Pimple, RN at [facility].

## 2019-10-15 NOTE — Telephone Encounter (Signed)
-----   Message from Nunzio Cobbs, MD sent at 10/15/2019 10:13 AM EDT ----- Please contact patient with endometrial biopsy results.  She has a benign endometrial polyp, which is what we were expecting based on her ultrasound findings.   Patient has advanced uterovaginal prolapse and incontinence and is seeing Dr. Matilde Sprang at [facility].  At a minimum, the polyp needs to be removed.  She has options about how to proceed with care.  1.  I can do a hysteroscopy with removal of the polyp and dilation and curettage as an outpatient surgery.  2.  Dr. Matilde Sprang and I can do a combined in patient surgery.  I would do a hysterectomy with removal of her tubes and ovaries and work with Dr. Matilde Sprang to complete her incontinence/prolapse surgery.  Hysterectomy, removal of the uterus, also removes the polyp.  She may wish to schedule an office visit to discuss further.

## 2019-10-19 NOTE — Telephone Encounter (Signed)
Spoke with patient, advised of results and recommendations as seen below per Dr. Quincy Simmonds. Patient has additional questions for Dr. Quincy Simmonds before making her decision. MyChart visit scheduled for 10/20/19 at 4:30pm to further discuss.  Patient verbalizes understanding and is agreeable.   Routing to provider for final review. Patient is agreeable to disposition. Will close encounter.

## 2019-10-19 NOTE — Telephone Encounter (Signed)
Spoke with patient. OV scheduled for 10/20/19 at 10am with Dr. Quincy Simmonds.   Routing to Dr. Antony Blackbird.

## 2019-10-19 NOTE — Telephone Encounter (Signed)
I prefer an in person visit instead of a My Chart visit.

## 2019-10-19 NOTE — Telephone Encounter (Signed)
Left message for pt to return call to triage RN. 

## 2019-10-20 ENCOUNTER — Encounter: Payer: Self-pay | Admitting: Obstetrics and Gynecology

## 2019-10-20 ENCOUNTER — Other Ambulatory Visit: Payer: Self-pay

## 2019-10-20 ENCOUNTER — Telehealth: Payer: Self-pay | Admitting: Obstetrics and Gynecology

## 2019-10-20 ENCOUNTER — Ambulatory Visit (INDEPENDENT_AMBULATORY_CARE_PROVIDER_SITE_OTHER): Payer: BC Managed Care – PPO | Admitting: Obstetrics and Gynecology

## 2019-10-20 VITALS — BP 148/88 | HR 100 | Temp 97.7°F | Ht 62.0 in | Wt 280.0 lb

## 2019-10-20 DIAGNOSIS — N813 Complete uterovaginal prolapse: Secondary | ICD-10-CM

## 2019-10-20 DIAGNOSIS — N950 Postmenopausal bleeding: Secondary | ICD-10-CM | POA: Diagnosis not present

## 2019-10-20 DIAGNOSIS — N840 Polyp of corpus uteri: Secondary | ICD-10-CM | POA: Diagnosis not present
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
## 2019-10-23 ENCOUNTER — Telehealth: Payer: Self-pay | Admitting: Obstetrics and Gynecology

## 2019-10-23 DIAGNOSIS — N840 Polyp of corpus uteri: Secondary | ICD-10-CM | POA: Insufficient documentation

## 2019-10-23 DIAGNOSIS — N813 Complete uterovaginal prolapse: Secondary | ICD-10-CM | POA: Insufficient documentation

## 2019-10-23 NOTE — Telephone Encounter (Signed)
Please reach out to Dr. Mikle Bosworth office regarding surgical planning for our patient in common.   She has postmenopausal bleeding and complete uterovaginal prolapse and mixed incontinence.  She is an established patient of Dr. Matilde Sprang for prolapse and incontinence.   She would like to proceed with comprehensive surgical care.   I outlined for her potential total vaginal hysterectomy with bilateral salpingo-oophorectomy versus laparoscopically assisted vaginal hysterectomy with BSO by me COMBINED WITH a prolapse and incontinence surgery performed by Dr. Matilde Sprang.   She wishes to proceed in this direction.   She will need medical clearance for surgery from her PCP.

## 2019-10-25 NOTE — Telephone Encounter (Signed)
Routing to Hayley for precert. 

## 2019-10-25 NOTE — Telephone Encounter (Signed)
Call to patient. Per DPR, OK to leave message on voicemail. °  °Left voicemail requesting a return call to [facility] to review benefits for recommended surgery with Brook A. Silva, MD, FACOG °

## 2019-11-03 NOTE — Telephone Encounter (Signed)
Spoke with patient regarding surgery benefits. Patient acknowledges understanding of information presented. Patient is aware that benefits presented are for professional benefits only. Patient is aware that once surgery is scheduled, the hospital will call with separate benefits. Patient is aware of surgery cancellation policy.  Patient has several questions regarding combined surgery with Dr. Matilde Sprang.   1. Patient stated that she was to have a pre-op procedure done with Dr. Matilde Sprang, and is wondering if she needs to coordinate that or if we will be coordinating that for her.  2. She also needs PCP clearance per Dr. Quincy Simmonds and wants to know if we will coordinate that or if she needs to.  3. Patient is wanting to know how far out we are looking to schedule to make sure she is able to take that time off from work.  Informed patient that I would send her questions to Dr. Quincy Simmonds and Verline Lema, nursing supervisor, and have them follow up with her. Patient is agreeable.

## 2019-11-03 NOTE — Telephone Encounter (Signed)
Left message for Pam at [facility] to call [place] at [phone] to discuss surgery case.  Spoke with patient. Advised patient that I have reached out to [facility] with [facility] to discuss scheduling. Advised we are likely looking at July for surgery. Will know more once I speak with Pam to coordinate care. Advised patient Dr.MacDiarmid's office will assist with scheduling pre procedure urodynamics. Advised will contact her PCP for pre op clearance appointment and return call.  Spoke with Dr.John's office appointment scheduled for tomorrow 11/04/2019 at 10:20 am with Dr.John.  Spoke with patient who is agreeable to appointment date and time.

## 2019-11-04 ENCOUNTER — Encounter: Payer: Self-pay | Admitting: Internal Medicine

## 2019-11-04 ENCOUNTER — Other Ambulatory Visit: Payer: Self-pay

## 2019-11-04 ENCOUNTER — Ambulatory Visit (INDEPENDENT_AMBULATORY_CARE_PROVIDER_SITE_OTHER): Payer: BC Managed Care – PPO | Admitting: Internal Medicine

## 2019-11-04 VITALS — BP 140/90 | HR 95 | Temp 98.8°F | Ht 62.0 in | Wt 283.0 lb

## 2019-11-04 DIAGNOSIS — Z0001 Encounter for general adult medical examination with abnormal findings: Secondary | ICD-10-CM | POA: Diagnosis not present

## 2019-11-04 DIAGNOSIS — I10 Essential (primary) hypertension: Secondary | ICD-10-CM

## 2019-11-04 DIAGNOSIS — Z01818 Encounter for other preprocedural examination: Secondary | ICD-10-CM

## 2019-11-04 DIAGNOSIS — I872 Venous insufficiency (chronic) (peripheral): Secondary | ICD-10-CM | POA: Diagnosis not present

## 2019-11-04 DIAGNOSIS — R739 Hyperglycemia, unspecified: Secondary | ICD-10-CM

## 2019-11-04 MED ORDER — IRBESARTAN-HYDROCHLOROTHIAZIDE 300-12.5 MG PO TABS: 1.0000 | ORAL_TABLET | Freq: Every day | ORAL | 3 refills | Status: DC

## 2019-11-04 NOTE — Telephone Encounter (Signed)
Thank you for coordinating care for my patient.  I will need to see her for a preop visit, which could be closer to the 30 day mark prior to surgery, and make the final determination of LAVH versus TVH.
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
## 2019-11-04 NOTE — Patient Instructions (Addendum)
Please remember to call for you yearly mammogram  Ok to increase the avalide to 300-12.5 mg daily  Your parking application was filled out  Please continue all other medications as before, and refills have been done if requested.  Please have the pharmacy call with any other refills you may need.  Please continue your efforts at being more active, low cholesterol diet, and weight control.  You are otherwise up to date with prevention measures today.  Please keep your appointments with your specialists as you may have planned  Please make an Appointment to return in 6 months, or sooner if needed  We will sign the preop form when we receive this for your surgury

## 2019-11-06 ENCOUNTER — Encounter: Payer: Self-pay | Admitting: Internal Medicine

## 2019-11-06 DIAGNOSIS — Z01818 Encounter for other preprocedural examination: Secondary | ICD-10-CM | POA: Insufficient documentation

## 2019-11-06 NOTE — Assessment & Plan Note (Signed)
stable overall by history and exam, recent data reviewed with pt, and pt to continue medical treatment as before,  to f/u any worsening symptoms or concerns  

## 2019-11-06 NOTE — Assessment & Plan Note (Signed)

## 2019-11-06 NOTE — Assessment & Plan Note (Addendum)
[place] for [employer] as planned  I spent 31 minutes in addition to time for CPX wellness examination in preparing to see the patient by review of recent labs, imaging and procedures, obtaining and reviewing separately obtained history, communicating with the patient and family or caregiver, ordering medications, tests or procedures, and documenting clinical information in the EHR including the differential Dx, treatment, and any further evaluation and other management of preop eval, hTN uncontrolled, hyperglycemia, and venous insufficiency

## 2019-11-06 NOTE — Assessment & Plan Note (Signed)
Mild elevated, to increase avalide 300/12.5 qd

## 2019-11-10 ENCOUNTER — Telehealth: Payer: Self-pay | Admitting: Obstetrics and Gynecology

## 2019-11-10 NOTE — Telephone Encounter (Signed)
I have spoken with Dr. Matilde Sprang just now to coordinate for surgery together.  He is now aware that the patient has advanced pelvic organ prolapse, and he would like to see her back for re-evaluation and completion of her work up so we can do a combined case.   I will also eventually need to see the patient back to review vaginal versus laparoscopically assisted vaginal hysterectomy with removal of bilateral tubes and ovaries.   I believe this case will need to be done at [facility] due to the patient's BMI.

## 2019-11-10 NOTE — Telephone Encounter (Signed)
Please contact the patient and inform her she needs to call Dr. Mikle Bosworth office back to schedule an appointment him for pre surgery evaluation and planning.

## 2019-11-11 NOTE — Telephone Encounter (Signed)
Left message to call Kaitlyn at [phone]. 

## 2019-11-11 NOTE — Telephone Encounter (Signed)
Left message to call Raeden Belzer at [phone]. 

## 2019-11-17 NOTE — Telephone Encounter (Signed)
Spoke with patient. Patient is going to call Dr.MacDiarmid's office to schedule an appointment for pre surgery evaluation and planning. Aware after she has had this appointment we can proceed with surgery planning.  Routing to provider and will close encounter.

## 2019-12-10 ENCOUNTER — Telehealth: Payer: Self-pay

## 2019-12-10 NOTE — Telephone Encounter (Signed)
Patient is calling to discuss scheduling surgery.

## 2019-12-13 NOTE — Telephone Encounter (Signed)
Spoke with patient. Patient states that she was seen with Dr.MacDiarmid and he is recommended urodynamic testing with his office prior to combination surgery with Dr.Silva. Per patient she is unable to have urodynamic testing with Dr.MacDiarmid's office until July. Patient does not wish to wait this long for testing and is asking if we can help. Advised I will call over to Dr.MacDiarmid's office and see if an earlier appointment can be made. Patient is agreeable.

## 2019-12-14 NOTE — Telephone Encounter (Signed)
Left message for Pam at [facility] to discuss Urodynamic testing.

## 2019-12-15 ENCOUNTER — Telehealth: Payer: Self-pay

## 2019-12-15 NOTE — Telephone Encounter (Signed)
Spoke with nurse at [facility] who is going to send Dr.MacDiarmid a message ans return call to patient with additional options for potential earlier testing.

## 2019-12-15 NOTE — Telephone Encounter (Signed)
°  Leamon Arnt, [place]  Certified Medical Assistant    Telephone Encounter  Signed  Creation Time:  12/15/2019 10:22 AM          Signed        Hassan Rowan from [facility] called to inform Veronica [name] that the patient has been scheduled for [employer] appointment for tomorrow (12/16/19)         Spoke with patient. Patient is aware of urodynamic date of 12/16/2019 with Dr.MacDiarmid's office. Patient is aware after this is complete and she and Dr.MacDiarmid have a surgery plan we can proceed with scheduling combined surgery case. Patient is agreeable.  Routing to provider and will close encounter.

## 2019-12-15 NOTE — Telephone Encounter (Signed)
Please see telephone encounter dated 12/10/2019. Encounter closed.

## 2019-12-15 NOTE — Telephone Encounter (Signed)
Hassan Rowan from [facility] called to inform Verline Lema that the patient has been scheduled for [facility] appointment for tomorrow (12/16/19)

## 2019-12-30 ENCOUNTER — Telehealth: Payer: Self-pay | Admitting: Obstetrics and Gynecology

## 2019-12-30 NOTE — Telephone Encounter (Signed)
Patient calling to discuss surgery scheduling.

## 2019-12-31 NOTE — Telephone Encounter (Signed)
Spoke with patient. Advised I have reached out to [facility] with Dr.MacDiarmid's office to discuss surgery date options. Veronica [name] is out of the office until 01/03/2020. Advised once we are able to speak on Monday and set a date I will call to review with her. Patient is agreeable.

## 2020-01-03 NOTE — Telephone Encounter (Signed)
Left message for Veronica [name] to call [name] at [phone].

## 2020-01-04 ENCOUNTER — Other Ambulatory Visit: Payer: Self-pay | Admitting: Urology

## 2020-01-04 NOTE — Telephone Encounter (Signed)
Spoke with patient. Lap assisted vaginal hysterectomy/BSO scheduled for 02/15/2020 at 0730 at [facility]. Pre op scheduled for 01/27/2020 at 11:30 am with Dr.Silva. COVID test scheduled for 02/11/2020 at 10 am at [facility] location. 1 week post op scheduled for 02/24/2020 at 3 pm with Dr.Silva. 6 week post op scheduled for 03/27/2020 at 4 pm with Dr.Silva. Patient is agreeable to all dates and times. Surgery instructions reviewed and mailed to verified home address on file. Patient would like to review benefits again for surgery.  Routing to [facility] to contact patient regarding benefits.

## 2020-01-04 NOTE — Telephone Encounter (Signed)
Spoke with Pam at [facility]. Surgery planned for 02/15/2020 as this is Dr.MacDiarmid's first available.

## 2020-01-05 NOTE — Telephone Encounter (Signed)
Thank you for the update!

## 2020-01-06 NOTE — Telephone Encounter (Signed)
Spoke with patient regarding surgery benefits. Patient acknowledges understanding of information presented. Encounter closed.
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
## 2020-01-27 ENCOUNTER — Ambulatory Visit (INDEPENDENT_AMBULATORY_CARE_PROVIDER_SITE_OTHER): Payer: BC Managed Care – PPO | Admitting: Obstetrics and Gynecology

## 2020-01-27 ENCOUNTER — Other Ambulatory Visit: Payer: Self-pay | Admitting: Obstetrics and Gynecology

## 2020-01-27 ENCOUNTER — Other Ambulatory Visit: Payer: Self-pay

## 2020-01-27 ENCOUNTER — Encounter: Payer: Self-pay | Admitting: Obstetrics and Gynecology

## 2020-01-27 ENCOUNTER — Ambulatory Visit
Admission: RE | Admit: 2020-01-27 | Discharge: 2020-01-27 | Disposition: A | Discharge status: Home / Self Care | Payer: BC Managed Care – PPO | Source: Ambulatory Visit | Attending: Obstetrics and Gynecology | Admitting: Obstetrics and Gynecology

## 2020-01-27 ENCOUNTER — Telehealth: Payer: Self-pay | Admitting: Obstetrics and Gynecology

## 2020-01-27 VITALS — BP 142/78 | HR 100 | Ht 62.0 in | Wt 281.0 lb

## 2020-01-27 DIAGNOSIS — N813 Complete uterovaginal prolapse: Secondary | ICD-10-CM

## 2020-01-27 DIAGNOSIS — N840 Polyp of corpus uteri: Secondary | ICD-10-CM

## 2020-01-27 DIAGNOSIS — Z1231 Encounter for screening mammogram for malignant neoplasm of breast: Secondary | ICD-10-CM

## 2020-01-27 DIAGNOSIS — N949 Unspecified condition associated with female genital organs and menstrual cycle: Secondary | ICD-10-CM

## 2020-01-27 DIAGNOSIS — N950 Postmenopausal bleeding: Secondary | ICD-10-CM | POA: Diagnosis not present

## 2020-01-27 NOTE — Telephone Encounter (Signed)
Call placed to [facility] at [phone], left detailed message with Neoma Laming on nurse line. Advised as seen below per Dr. Quincy Simmonds. Return call to office at [phone] to confirm surgical plan.

## 2020-01-27 NOTE — Telephone Encounter (Signed)
Please reach out to Dr. Mikle Bosworth office to confirm his surgical plan for our patient in common who is having dual surgery on 02/15/20.  Patient has had a significant increase in her prolapse and now has procidentia.   Does he need to see her again before surgery?  I will be proceeding with an LAVH/BSO for her prolapse as she has a paratubal of paraovarian cyst that will be removed.   I would like to give her Lovenox preop the morning of surgery if he is ok with that plan.

## 2020-01-28 NOTE — Telephone Encounter (Signed)
Front office advised message left on voicemail from [place] at [facility] returning call.   Returned call to Neoma Laming at [facility] [phone] ex. [phone]  Left message to call Sharee Pimple, RN at [place].

## 2020-01-28 NOTE — Telephone Encounter (Signed)
Thank you for the update.  You may close the encounter.  Cc- Kaitlyn Sprague

## 2020-01-28 NOTE — Telephone Encounter (Signed)
Neoma Laming from [facility] returned call. States Dr Matilde Sprang does not want pt on Lovenox preoperatively. Pt has new appt consult with Dr Matilde Sprang on 02/04/20 to reassess if pt is still candidate for surgery. Will contact Dr Quincy Simmonds after appt.   Routing to Dr Quincy Simmonds for update

## 2020-02-04 ENCOUNTER — Telehealth: Payer: Self-pay | Admitting: *Deleted

## 2020-02-04 DIAGNOSIS — N950 Postmenopausal bleeding: Secondary | ICD-10-CM

## 2020-02-04 DIAGNOSIS — N813 Complete uterovaginal prolapse: Secondary | ICD-10-CM

## 2020-02-04 DIAGNOSIS — N840 Polyp of corpus uteri: Secondary | ICD-10-CM

## 2020-02-04 NOTE — Telephone Encounter (Signed)
Message received from answering service to contact [place] at [facility] today at [phone] ext. [phone] regarding patient.   Call returned to [facility]. Was advised patient currently in office, Dr. Matilde Sprang requesting to speak with Dr. Quincy Simmonds directly. Advised Dr. Quincy Simmonds is out of the office today, will try to contact her for return call. RN asked if urgent? Can this be reviewed with covering provider? Was advised would prefer return call from Dr. Quincy Simmonds today or Monday at the latest. Advised I will contact Dr. Quincy Simmonds and return call with update.   Spoke with Dr. Quincy Simmonds, contact information provided. She is currently in a meeting, will contact Dr. Matilde Sprang directly in approximately 1 hour.   Call returned to Deb to provide update.   Routing to Dr. Quincy Simmonds

## 2020-02-07 NOTE — Telephone Encounter (Signed)
I spoke with Dr. Matilde Sprang and we agree that the patient has rapidly progressing pelvic organ prolapse that would be best treated with a combined case of robotic hysterectomy and sacrocolpopexy procedure for surgical correction of the prolapse.   She does have a history of postmenopausal bleeding and an endometrial polyp that would be treated with removal of her uterus.   Dr. Matilde Sprang and I agree with referral to Dr. Maryland Pink at [facility] for potential surgical treatment, as she is trained to do all of these procedures.   I have left a message for the patient just now to return my call.   Dr. Matilde Sprang has requested an update after I have contact with the patient.   I may be able to fit her with a pessary to give her some relief of the prolapse while she is awaiting further consultation.

## 2020-02-08 NOTE — Telephone Encounter (Signed)
Spoke with Ubaldo Glassing at [place] [phone]  Was advised first available with Dr. Zigmund Daniel at [facility] or [place] location 04/2020. Was advised urgent referral request can be faxed to [phone], referral will be reviewed and patient will be scheduled with first available provider, was advised can not guarantee patient will be scheduled with Dr. Zigmund Daniel.   Patient scheduled for first available appt in [place] on 04/26/20 at 11:15am. Patient also placed on wait list.  Advised I will review with Dr. Quincy Simmonds and submit urgent referral if recommended.   Also routing to Lamont Snowball, RN to cancel surgery.   Dr. Quincy Simmonds -please review and advise if urgent referral is recommended.  Cc: Lamont Snowball, RN

## 2020-02-08 NOTE — Telephone Encounter (Signed)
Reviewed with Dr. Quincy Simmonds, urgent referral recommended due to PMB.   Urgent referral placed.   Call returned to [facility], spoke with [facility].  Urgent referral request sent to Dr. Zigmund Daniel nurse to review with provider, requesting earlier appt. OV notes and referral faxed to [phone]. Will keep OV as scheduled for now until reviewed with provider. Their office will contact patient to schedule.

## 2020-02-08 NOTE — Telephone Encounter (Addendum)
Central Scheduling Maudie Mercury) notified case is canceled. All post op appointments also canceled.

## 2020-02-08 NOTE — Telephone Encounter (Signed)
Please make a referral to Dr. Maryland Pink for uterine procidentia and a potential robotic laparoscopic hysterectomy and sacrocolpopexy.   Please also indicate that patient has had postmenopausal bleeding and has an endometrial polyp.   If there is going to be a delay in consultation, I would be happy to see her back for a pessary fitting.   Patient's surgery needs to be cancelled and Dr. Mikle Bosworth office needs to know.

## 2020-02-08 NOTE — Telephone Encounter (Signed)
Spoke with patient, advised of referral as seen below. Patient is aware she will be contacted directly by Dr. Zigmund Daniel office for scheduling once referral has been reviewed. Initial OV scheduled for 11/10 at 11:15am. Patient will wait on appt update before deciding proceed with pessary. Patient is aware surgery and post-op appts have been cancelled. Dr. Matilde Sprang will be updated. Patient thankful for call.   Call placed to [facility], spoke with Neoma Laming, update provided. She is aware to contact office if any additional assistance needed or questions.   Will keep in triage calls to f/u on scheduling.   Routing to Dr. Antony Blackbird.   Cc: Magdalene Patricia

## 2020-02-09 ENCOUNTER — Encounter (HOSPITAL_COMMUNITY): Admission: RE | Admit: 2020-02-09 | Payer: BC Managed Care – PPO | Source: Ambulatory Visit

## 2020-02-09 NOTE — Telephone Encounter (Signed)
Thank you for the update!

## 2020-02-10 ENCOUNTER — Telehealth: Payer: Self-pay | Admitting: *Deleted

## 2020-02-10 NOTE — Telephone Encounter (Signed)
Patient returned call, states she would like to see Dr. Quincy Simmonds for a pessary.  See previous telephone encounter dated 02/04/20,  OV scheduled for 02/14/20 at 3pm with Dr. Quincy Simmonds.  Patient is agreeable to date and time.   Routing to Dr. Antony Blackbird.   Encounter closed.

## 2020-02-10 NOTE — Telephone Encounter (Signed)
Per review of Epic, patients appt with Dr. Zigmund Daniel has been moved to 03/22/20 and patient has been placed on a wait list. Patient was notified by their office.  Call placed to patient. Patient declines OV for pessary at this time. Patient states she would like to wait and see if there are any cancellations for Dr. Zigmund Daniel in the upcoming week. Patient is aware to contact the office if she desires OV or if any additional questions/concerns.    Routing to provider for final review. Patient is agreeable to disposition. Will close encounter.  CC: Wilson Singer, Magdalene Patricia

## 2020-02-11 ENCOUNTER — Other Ambulatory Visit (HOSPITAL_COMMUNITY): Payer: Self-pay

## 2020-02-14 ENCOUNTER — Ambulatory Visit: Payer: BC Managed Care – PPO | Admitting: Obstetrics and Gynecology

## 2020-02-14 ENCOUNTER — Encounter: Payer: Self-pay | Admitting: Obstetrics and Gynecology

## 2020-02-14 ENCOUNTER — Other Ambulatory Visit: Payer: Self-pay

## 2020-02-14 VITALS — BP 140/84 | HR 84 | Ht 62.0 in | Wt 283.0 lb

## 2020-02-14 DIAGNOSIS — N813 Complete uterovaginal prolapse: Secondary | ICD-10-CM
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
## 2020-02-15 ENCOUNTER — Ambulatory Visit: Admit: 2020-02-15 | Payer: BC Managed Care – PPO | Admitting: Obstetrics and Gynecology

## 2020-02-15 SURGERY — HYSTERECTOMY, VAGINAL, LAPAROSCOPY-ASSISTED, WITH SALPINGO-OOPHORECTOMY
Anesthesia: General

## 2020-02-24 ENCOUNTER — Ambulatory Visit: Payer: BC Managed Care – PPO | Admitting: Obstetrics and Gynecology
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
## 2020-02-28 ENCOUNTER — Encounter: Payer: Self-pay | Admitting: Obstetrics and Gynecology

## 2020-02-28 ENCOUNTER — Other Ambulatory Visit: Payer: Self-pay

## 2020-02-28 ENCOUNTER — Ambulatory Visit (INDEPENDENT_AMBULATORY_CARE_PROVIDER_SITE_OTHER): Payer: BC Managed Care – PPO | Admitting: Obstetrics and Gynecology

## 2020-02-28 VITALS — BP 128/76 | HR 76 | Resp 14 | Ht 62.0 in | Wt 278.0 lb

## 2020-02-28 DIAGNOSIS — N813 Complete uterovaginal prolapse: Secondary | ICD-10-CM | POA: Diagnosis not present

## 2020-02-28 NOTE — Patient Instructions (Signed)

## 2020-03-07 ENCOUNTER — Ambulatory Visit (INDEPENDENT_AMBULATORY_CARE_PROVIDER_SITE_OTHER): Payer: BC Managed Care – PPO | Admitting: Obstetrics and Gynecology

## 2020-03-07 ENCOUNTER — Other Ambulatory Visit: Payer: Self-pay

## 2020-03-07 ENCOUNTER — Encounter: Payer: Self-pay | Admitting: Obstetrics and Gynecology

## 2020-03-07 VITALS — BP 138/80 | HR 88 | Resp 14 | Ht 62.0 in | Wt 276.0 lb

## 2020-03-07 DIAGNOSIS — Z4689 Encounter for fitting and adjustment of other specified devices: Secondary | ICD-10-CM | POA: Diagnosis not present

## 2020-03-07 DIAGNOSIS — N840 Polyp of corpus uteri: Secondary | ICD-10-CM | POA: Diagnosis not present

## 2020-03-07 DIAGNOSIS — N813 Complete uterovaginal prolapse: Secondary | ICD-10-CM
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
## 2020-03-27 ENCOUNTER — Ambulatory Visit: Payer: BC Managed Care – PPO | Admitting: Obstetrics and Gynecology

## 2020-03-28 DIAGNOSIS — Z6841 Body Mass Index (BMI) 40.0 and over, adult: Secondary | ICD-10-CM | POA: Insufficient documentation

## 2020-05-17 ENCOUNTER — Ambulatory Visit: Payer: BC Managed Care – PPO | Admitting: Obstetrics and Gynecology

## 2020-05-22 ENCOUNTER — Other Ambulatory Visit: Payer: Self-pay | Admitting: Gastroenterology

## 2020-05-30 ENCOUNTER — Ambulatory Visit: Payer: BC Managed Care – PPO | Admitting: Obstetrics and Gynecology

## 2020-05-30 ENCOUNTER — Encounter: Payer: Self-pay | Admitting: Obstetrics and Gynecology

## 2020-05-30 ENCOUNTER — Other Ambulatory Visit: Payer: Self-pay

## 2020-05-30 VITALS — BP 158/90 | HR 96 | Ht 62.0 in | Wt 273.0 lb

## 2020-05-30 DIAGNOSIS — N813 Complete uterovaginal prolapse: Secondary | ICD-10-CM | POA: Diagnosis not present

## 2020-05-30 DIAGNOSIS — Z4689 Encounter for fitting and adjustment of other specified devices: Secondary | ICD-10-CM | POA: Diagnosis not present

## 2020-05-30 NOTE — Patient Instructions (Signed)
Best wishes for successful surgery and a great recovery!  Josefa Half, MD
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
## 2020-06-20 DIAGNOSIS — Z9189 Other specified personal risk factors, not elsewhere classified: Secondary | ICD-10-CM | POA: Insufficient documentation

## 2020-06-28 ENCOUNTER — Telehealth: Payer: Self-pay | Admitting: *Deleted

## 2020-06-28 NOTE — Telephone Encounter (Signed)
I am happy to see Veronica [name] for a visit!

## 2020-06-28 NOTE — Telephone Encounter (Signed)
Patient called to update you with surgery with Dr.Catherine Zigmund Daniel reports it has been canceled for 07/06/20 due to Covid increased numbers. Patient said pessary was removed last Wednesday (06/21/20) she did not have a future surgery date yet, they did offer her to have surgery in [facility] vs reschedule at the main campus, however patient said she does not want to have surgery in [place]. She told me the discomfort has returned due to pessary removal, I suggested she schedule an office visit with you to have pessary re- inserted. Just FYI.

## 2020-06-29 ENCOUNTER — Ambulatory Visit (INDEPENDENT_AMBULATORY_CARE_PROVIDER_SITE_OTHER): Payer: Medicare Other | Admitting: Obstetrics and Gynecology

## 2020-06-29 ENCOUNTER — Other Ambulatory Visit: Payer: Self-pay

## 2020-06-29 ENCOUNTER — Encounter: Payer: Self-pay | Admitting: Obstetrics and Gynecology

## 2020-06-29 ENCOUNTER — Telehealth: Payer: Self-pay | Admitting: Obstetrics and Gynecology

## 2020-06-29 VITALS — BP 170/90 | HR 96 | Ht 62.0 in | Wt 275.0 lb

## 2020-06-29 DIAGNOSIS — N840 Polyp of corpus uteri: Secondary | ICD-10-CM | POA: Diagnosis not present

## 2020-06-29 DIAGNOSIS — N813 Complete uterovaginal prolapse: Secondary | ICD-10-CM | POA: Diagnosis not present

## 2020-06-29 NOTE — Telephone Encounter (Signed)
Please order a new pessary for my patient.  She needs a 3 1/4 inch gelhorn with a regular stem.  Her prior pessary worked well but was thrown away by a provider at another office in anticipation of her prolapse surgery.
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
## 2020-06-29 NOTE — Patient Instructions (Signed)
[email or web address]"> Diagnostic ultrasound (5th ed., pp. 528-563). [place]: Elsevier."> Pfenninger and Fowler's procedures for primary care (4th ed., pp. 925-931). [place]: Elsevier.">  Sonohysterogram  A sonohysterogram is a procedure to examine the inside of the uterus. This exam uses sound waves that are sent to a computer to make images of the lining of the uterus (endometrium). To get the best images, a salt-water solution is put into the uterus through the vagina. You may have this procedure if you have certain reproductive problems, such as abnormal bleeding, infertility, or miscarriage. This procedure can show what may be causing these problems. Possible causes of these problems include scarring or abnormal growths, such as fibroids, inside your uterus. It can also show if your uterus is an abnormal shape or if the lining of the uterus is too thin. Tell a health care provider about:  All medicines you are taking, including vitamins, herbs, eye drops, creams, and over-the-counter medicines.  Any allergies you have.  Any blood disorders you have.  Any surgeries you have had.  Any medical conditions you have.  Whether you are pregnant or may be pregnant.  The date of the first day of your last period.  Any signs of infection, such as fever, pain in your lower abdomen, or abnormal discharge from your vagina. The procedure will not be done if you have an infection. What are the risks? Generally, this is a safe procedure. However, problems may occur, including:  Abdominal pain or cramping.  Light bleeding (spotting).  Increased vaginal discharge.  Infection. What happens before the procedure?  Your health care provider may have you take an over-the-counter pain medicine.  You may be given medicine to stop any abnormal bleeding.  You may be given antibiotic medicine to help prevent infection.  You may be asked to take a pregnancy  test. This is usually in the form of a urine test. The procedure will not be done if you are pregnant.  You may have a pelvic exam.  You will be asked to empty your bladder. What happens during the procedure?  You will lie down on the exam table with your feet in stirrups or with your knees bent and your feet flat on the table.  A slender, handheld device (transducer) will be lubricated and placed into your vagina.  The transducer will be positioned to send sound waves to your uterus. The sound waves will be sent to a computer and turned into images, which your health care provider will see during the procedure.  The transducer will be removed from your vagina.  An instrument called a speculum will be inserted to widen the opening of your vagina.  A swab with germ-killing solution (antiseptic) will be used to clean the opening to your uterus (cervix).  A long, thin tube (catheter) will be placed through your cervix into your uterus, and the speculum will be removed.  The transducer will be placed back into your vagina to take more images.  Your uterus will be filled with a germ-free salt-water solution (sterile saline) through the catheter. You may feel some cramping.  A fluid that contains air bubbles may be sent through the catheter to make it easier to see the fallopian tubes.  The transducer and catheter will be removed. The procedure may vary among health care providers and hospitals. What can I expect after the procedure? After the procedure, it is common to have:  Light bleeding from your vagina (spotting).  Pain or cramping in your abdomen.    Watery discharge from your vagina. Follow these instructions at home:  Take over-the-counter and prescription medicines only as told by your health care provider.  If you were prescribed an antibiotic medicine, take it as told by your health care provider. Do not stop taking the antibiotic even if you start to feel better.  Wear  a sanitary pad or a tampon if you have spotting.  Return to your normal activities as told by your health care provider. Ask your health care provider what activities are safe for you.  It is up to you to get the results of your procedure. Ask your health care provider, or the department that is doing the procedure, when your results will be ready.  Keep all follow-up visits as told by your health care provider. This is important. Contact a health care provider if you have:  Chills or a fever.  Pain or cramping that does not go away even with medicine.  An increase in vaginal discharge.  Vaginal discharge that is yellow or green in color and has a bad smell.  Nausea. Get help right away if you have:  Severe pain in your abdomen.  Heavy bleeding from your vagina. Summary  A sonohysterogram is a procedure that creates images of the inside of the uterus.  You may have this procedure if you have certain reproductive problems, such as abnormal bleeding, infertility, or miscarriage.  You may need to have a pelvic exam and take a pregnancy test before this procedure. The procedure will not be done if you are pregnant or have an infection.  After the procedure, it is common to have cramping and spotting. This information is not intended to replace advice given to you by your health care provider. Make sure you discuss any questions you have with your health care provider. Document Revised: 05/18/2019 Document Reviewed: 05/18/2019 Elsevier Patient Education  2021 Elsevier Inc.  

## 2020-07-04 ENCOUNTER — Other Ambulatory Visit: Payer: Self-pay | Admitting: Gastroenterology

## 2020-07-11 NOTE — Telephone Encounter (Signed)
Spoke with patient to schedule sonohysterogram. Sonohysterogram scheduled for 07/27/2020. Patient stated that she was supposed to be getting a new pessary, but had not heard if it had arrived yet. Patient stated that she was unsure if Dr. Quincy Simmonds wanted to wait until after her [facility] to reinsert the pessary or not. Patient stated that she is getting very uncomfortable without the pessary.  Cc: Dr. Quincy Simmonds

## 2020-07-11 NOTE — Telephone Encounter (Signed)
The pessary and the sonohysterogram are independent. It is ok for her to start using the pessary as soon as it arrives. I will just need to remove it to do her sonohysterogram.

## 2020-07-11 NOTE — Telephone Encounter (Signed)
Pessary ordered 07/10/20.

## 2020-07-17 ENCOUNTER — Other Ambulatory Visit: Payer: Self-pay | Admitting: Internal Medicine

## 2020-07-17 NOTE — Telephone Encounter (Signed)
Please refill as per office routine med refill policy (all routine meds refilled for 3 mo or monthly per pt preference up to one year from last visit, then month to month grace period for 3 mo, then further med refills will have to be denied)  

## 2020-07-17 NOTE — Telephone Encounter (Signed)
Spoke with patient. Advised as seen below per Dr. Quincy Simmonds. Advised patient she will be notified once pessary received in office. Patient agreeable to proceed with [facility] as scheduled.

## 2020-07-26 NOTE — Telephone Encounter (Signed)
Pessary delivered to [facility].  Patient is scheduled for [facility] and consult on 07/27/20.  Pessary to [facility].

## 2020-07-27 ENCOUNTER — Ambulatory Visit (INDEPENDENT_AMBULATORY_CARE_PROVIDER_SITE_OTHER): Payer: Medicare Other | Admitting: Obstetrics and Gynecology

## 2020-07-27 ENCOUNTER — Other Ambulatory Visit (HOSPITAL_COMMUNITY)
Admission: RE | Admit: 2020-07-27 | Discharge: 2020-07-27 | Disposition: A | Discharge status: Home / Self Care | Payer: Medicare Other | Source: Ambulatory Visit | Attending: Obstetrics and Gynecology | Admitting: Obstetrics and Gynecology

## 2020-07-27 ENCOUNTER — Encounter: Payer: Self-pay | Admitting: Obstetrics and Gynecology

## 2020-07-27 ENCOUNTER — Ambulatory Visit (INDEPENDENT_AMBULATORY_CARE_PROVIDER_SITE_OTHER): Payer: Medicare Other

## 2020-07-27 ENCOUNTER — Other Ambulatory Visit: Payer: Self-pay

## 2020-07-27 VITALS — BP 142/82 | HR 90 | Ht 62.0 in | Wt 275.0 lb

## 2020-07-27 DIAGNOSIS — N950 Postmenopausal bleeding: Secondary | ICD-10-CM

## 2020-07-27 DIAGNOSIS — N840 Polyp of corpus uteri: Secondary | ICD-10-CM

## 2020-07-27 DIAGNOSIS — N9489 Other specified conditions associated with female genital organs and menstrual cycle: Secondary | ICD-10-CM

## 2020-07-27 NOTE — Patient Instructions (Signed)
Endometrial Biopsy  An endometrial biopsy is a procedure to remove tissue samples from the endometrium, which is the lining of the uterus. The tissue that is removed can then be checked under a microscope for disease. This procedure is used to diagnose conditions such as endometrial cancer, endometrial tuberculosis, polyps, or other inflammatory conditions. This procedure may also be used to investigate uterine bleeding to determine where you are in your menstrual cycle or how your hormone levels are affecting the lining of the uterus. Tell a health care provider about:  Any allergies you have.  All medicines you are taking, including vitamins, herbs, eye drops, creams, and over-the-counter medicines.  Any problems you or family members have had with anesthetic medicines.  Any blood disorders you have.  Any surgeries you have had.  Any medical conditions you have.  Whether you are pregnant or may be pregnant. What are the risks? Generally, this is a safe procedure. However, problems may occur, including:  Bleeding.  Pelvic infection.  Puncture of the wall of the uterus with the biopsy device (rare).  Allergic reactions to medicines. What happens before the procedure?  Keep a record of your menstrual cycles as told by your health care provider. You may need to schedule your procedure for a specific time in your cycle.  You may want to bring a sanitary pad to wear after the procedure.  Plan to have someone take you home from the hospital or clinic.  Ask your health care provider about: ? Changing or stopping your regular medicines. This is especially important if you are taking diabetes medicines, arthritis medicines, or blood thinners. ? Taking medicines such as aspirin and ibuprofen. These medicines can thin your blood. Do not take these medicines unless your health care provider tells you to take them. ? Taking over-the-counter medicines, vitamins, herbs, and  supplements. What happens during the procedure?  You will lie on an exam table with your feet and legs supported as in a pelvic exam.  Your health care provider will insert an instrument (speculum) into your vagina to see your cervix.  Your cervix will be cleansed with an antiseptic solution.  A medicine (local anesthetic) will be used to numb the cervix.  A forceps instrument (tenaculum) will be used to hold your cervix steady for the biopsy.  A thin, rod-like instrument (uterine sound) will be inserted through your cervix to determine the length of your uterus and the location where the biopsy sample will be removed.  A thin, flexible tube (catheter) will be inserted through your cervix and into the uterus. The catheter will be used to collect the biopsy sample from your endometrial tissue.  The catheter and speculum will then be removed, and the tissue sample will be sent to a lab for examination. The procedure may vary among health care providers and hospitals. What can I expect after procedure?  You will rest in a recovery area until you are ready to go home.  You may have mild cramping and a small amount of vaginal bleeding. This is normal.  You may have a small amount of vaginal bleeding for a few days. This is normal.  It is up to you to get the results of your procedure. Ask your health care provider, or the department that is doing the procedure, when your results will be ready. Follow these instructions at home:  Take over-the-counter and prescription medicines only as told by your health care provider.  Do not douche, use tampons, or have   sexual intercourse until your health care provider approves.  Return to your normal activities as told by your health care provider. Ask your health care provider what activities are safe for you.  Follow instructions from your health care provider about any activity restrictions, such as restrictions on strenuous exercise or heavy  lifting.  Keep all follow-up visits. This is important. Contact a health care provider:  You have heavy bleeding, or bleed for longer than 2 days after the procedure.  You have bad smelling discharge from your vagina.  You have a fever or chills.  You have a burning sensation when urinating or you have difficulty urinating.  You have severe pain in your lower abdomen. Get help right away if you:  You have severe cramps in your stomach or back.  You pass large blood clots.  Your bleeding increases.  You become weak or light-headed, or you faint or lose consciousness. Summary  An endometrial biopsy is a procedure to remove tissue samples is taken from the endometrium, which is the lining of the uterus.  The tissue sample that is removed will be checked under a microscope for disease.  This procedure is used to diagnose conditions such as endometrial cancer, endometrial tuberculosis, polyps, or other inflammatory conditions.  After the procedure, it is common to have mild cramping and a small amount of vaginal bleeding for a few days.  Do not douche, use tampons, or have sexual intercourse until your health care provider approves. Ask your health care provider which activities are safe for you. This information is not intended to replace advice given to you by your health care provider. Make sure you discuss any questions you have with your health care provider. Document Revised: 12/27/2019 Document Reviewed: 12/27/2019 Elsevier Patient Education  2021 Elsevier Inc.  
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
## 2020-07-31 LAB — SURGICAL PATHOLOGY

## 2020-07-31 LAB — CA 125: CA 125: 10 U/mL (ref <35)

## 2020-08-03 ENCOUNTER — Ambulatory Visit: Payer: Medicare Other | Admitting: Obstetrics and Gynecology

## 2020-08-07 ENCOUNTER — Other Ambulatory Visit: Payer: Self-pay

## 2020-08-07 DIAGNOSIS — N9489 Other specified conditions associated with female genital organs and menstrual cycle: Secondary | ICD-10-CM

## 2020-08-08 ENCOUNTER — Ambulatory Visit: Payer: Medicare Other | Admitting: Obstetrics and Gynecology

## 2020-08-08 ENCOUNTER — Ambulatory Visit (INDEPENDENT_AMBULATORY_CARE_PROVIDER_SITE_OTHER): Payer: Medicare Other | Admitting: Nurse Practitioner

## 2020-08-08 ENCOUNTER — Other Ambulatory Visit: Payer: Self-pay

## 2020-08-08 ENCOUNTER — Encounter: Payer: Self-pay | Admitting: Nurse Practitioner

## 2020-08-08 VITALS — BP 130/82 | HR 101 | Wt 275.0 lb

## 2020-08-08 DIAGNOSIS — N813 Complete uterovaginal prolapse: Secondary | ICD-10-CM | POA: Diagnosis not present

## 2020-08-08 DIAGNOSIS — Z4689 Encounter for fitting and adjustment of other specified devices: Secondary | ICD-10-CM | POA: Diagnosis not present

## 2020-08-08 NOTE — Progress Notes (Signed)
   Acute Office Visit  Subjective:    Patient ID: Veronica [name], female    DOB: [birth year], 70 y.o.   MRN: [id number]   HPI 70 y.o. presents today for pessary insertion for uterine prolapse. She was scheduled to have hysterectomy with prolapse repair but this was canceled due to pandemic. Pessary was thrown out during preop appointment, so new Gelhorn pessary was ordered. Since removal of pessary she has had some pain/pressure in vaginal area.   Review of Systems  Constitutional: Negative.   Genitourinary:       Vaginal pressure       Objective:    Physical Exam Constitutional:      Appearance: Normal appearance.  Genitourinary:    General: Normal vulva.     Urethra: Prolapse (complete) present.     Uterus: With uterine prolapse.      BP 130/82   Pulse (!) 101   Wt 275 lb (124.7 kg)   LMP 06/17/1997 (Approximate)   SpO2 97%   BMI 50.30 kg/m  Wt Readings from Last 3 Encounters:  08/08/20 275 lb (124.7 kg)  07/27/20 275 lb (124.7 kg)  06/29/20 275 lb (124.7 kg)        Assessment & Plan:   Problem List Items Addressed This Visit   None   Visit Diagnoses    Pessary maintenance    -  Primary   Uterine procidentia         Plan: Gelhorn pessary inserted with ease and with good fit. Patient ambulated and beared down with pessary remaining in place. She will schedule follow up in 3 months for maintenance. She is agreeable to plan.      Tamela Gammon [facility], 4:22 PM 08/08/2020

## 2020-08-13 ENCOUNTER — Other Ambulatory Visit: Payer: Self-pay | Admitting: Internal Medicine

## 2020-08-13 NOTE — Telephone Encounter (Signed)
Please refill as per office routine med refill policy (all routine meds refilled for 3 mo or monthly per pt preference up to one year from last visit, then month to month grace period for 3 mo, then further med refills will have to be denied)  

## 2020-08-15 ENCOUNTER — Other Ambulatory Visit: Payer: Self-pay | Admitting: Internal Medicine

## 2020-08-15 NOTE — Telephone Encounter (Signed)
Please refill as per office routine med refill policy (all routine meds refilled for 3 mo or monthly per pt preference up to one year from last visit, then month to month grace period for 3 mo, then further med refills will have to be denied)  

## 2020-08-21 ENCOUNTER — Other Ambulatory Visit: Payer: Self-pay | Admitting: Gastroenterology

## 2020-09-12 ENCOUNTER — Other Ambulatory Visit: Payer: Self-pay | Admitting: Internal Medicine

## 2020-09-12 NOTE — Telephone Encounter (Signed)
Please refill as per office routine med refill policy (all routine meds refilled for 3 mo or monthly per pt preference up to one year from last visit, then month to month grace period for 3 mo, then further med refills will have to be denied)  

## 2020-09-20 ENCOUNTER — Ambulatory Visit (INDEPENDENT_AMBULATORY_CARE_PROVIDER_SITE_OTHER): Payer: Medicare Other | Admitting: Internal Medicine

## 2020-09-20 ENCOUNTER — Encounter: Payer: Self-pay | Admitting: Internal Medicine

## 2020-09-20 ENCOUNTER — Other Ambulatory Visit: Payer: Self-pay | Admitting: Internal Medicine

## 2020-09-20 ENCOUNTER — Other Ambulatory Visit: Payer: Self-pay

## 2020-09-20 VITALS — BP 138/86 | HR 90 | Ht 62.0 in | Wt 265.0 lb

## 2020-09-20 DIAGNOSIS — R739 Hyperglycemia, unspecified: Secondary | ICD-10-CM | POA: Diagnosis not present

## 2020-09-20 DIAGNOSIS — E538 Deficiency of other specified B group vitamins: Secondary | ICD-10-CM

## 2020-09-20 DIAGNOSIS — I10 Essential (primary) hypertension: Secondary | ICD-10-CM

## 2020-09-20 DIAGNOSIS — Z0001 Encounter for general adult medical examination with abnormal findings: Secondary | ICD-10-CM | POA: Diagnosis not present

## 2020-09-20 DIAGNOSIS — E559 Vitamin D deficiency, unspecified: Secondary | ICD-10-CM | POA: Diagnosis not present

## 2020-09-20 DIAGNOSIS — K219 Gastro-esophageal reflux disease without esophagitis: Secondary | ICD-10-CM

## 2020-09-20 LAB — VITAMIN D 25 HYDROXY (VIT D DEFICIENCY, FRACTURES): VITD: 8.15 ng/mL — ABNORMAL LOW (ref 30.00–100.00)

## 2020-09-20 LAB — CBC WITH DIFFERENTIAL/PLATELET
Basophils Absolute: 0 10*3/uL (ref 0.0–0.1)
Basophils Relative: 0.5 % (ref 0.0–3.0)
Eosinophils Absolute: 0.1 10*3/uL (ref 0.0–0.7)
Eosinophils Relative: 1.2 % (ref 0.0–5.0)
HCT: 38.8 % (ref 36.0–46.0)
Hemoglobin: 13.2 g/dL (ref 12.0–15.0)
Lymphocytes Relative: 22.6 % (ref 12.0–46.0)
Lymphs Abs: 1.5 10*3/uL (ref 0.7–4.0)
MCHC: 34.1 g/dL (ref 30.0–36.0)
MCV: 89.8 fl (ref 78.0–100.0)
Monocytes Absolute: 0.4 10*3/uL (ref 0.1–1.0)
Monocytes Relative: 6.3 % (ref 3.0–12.0)
Neutro Abs: 4.7 10*3/uL (ref 1.4–7.7)
Neutrophils Relative %: 69.4 % (ref 43.0–77.0)
Platelets: 215 10*3/uL (ref 150.0–400.0)
RBC: 4.32 Mil/uL (ref 3.87–5.11)
RDW: 13.1 % (ref 11.5–15.5)
WBC: 6.8 10*3/uL (ref 4.0–10.5)

## 2020-09-20 LAB — URINALYSIS, ROUTINE W REFLEX MICROSCOPIC
Bilirubin Urine: NEGATIVE
Hgb urine dipstick: NEGATIVE
Ketones, ur: NEGATIVE
Nitrite: NEGATIVE
RBC / HPF: NONE SEEN (ref 0–?)
Specific Gravity, Urine: 1.01 (ref 1.000–1.030)
Total Protein, Urine: NEGATIVE
Urine Glucose: NEGATIVE
Urobilinogen, UA: 0.2 (ref 0.0–1.0)
pH: 6.5 (ref 5.0–8.0)

## 2020-09-20 LAB — HEPATIC FUNCTION PANEL
ALT: 8 U/L (ref 0–35)
AST: 13 U/L (ref 0–37)
Albumin: 4 g/dL (ref 3.5–5.2)
Alkaline Phosphatase: 76 U/L (ref 39–117)
Bilirubin, Direct: 0.3 mg/dL (ref 0.0–0.3)
Total Bilirubin: 1.9 mg/dL — ABNORMAL HIGH (ref 0.2–1.2)
Total Protein: 7.6 g/dL (ref 6.0–8.3)

## 2020-09-20 LAB — LIPID PANEL
Cholesterol: 158 mg/dL (ref 0–200)
HDL: 44.9 mg/dL (ref >39.00)
LDL Cholesterol: 100 mg/dL — ABNORMAL HIGH (ref 0–99)
NonHDL: 113.54
Total CHOL/HDL Ratio: 4
Triglycerides: 69 mg/dL (ref 0.0–149.0)
VLDL: 13.8 mg/dL (ref 0.0–40.0)

## 2020-09-20 LAB — BASIC METABOLIC PANEL
BUN: 14 mg/dL (ref 6–23)
CO2: 35 mEq/L — ABNORMAL HIGH (ref 19–32)
Calcium: 9.3 mg/dL (ref 8.4–10.5)
Chloride: 98 mEq/L (ref 96–112)
Creatinine, Ser: 0.83 mg/dL (ref 0.40–1.20)
GFR: 70.96 mL/min (ref >60.00)
Glucose, Bld: 85 mg/dL (ref 70–99)
Potassium: 3.1 mEq/L — ABNORMAL LOW (ref 3.5–5.1)
Sodium: 140 mEq/L (ref 135–145)

## 2020-09-20 LAB — VITAMIN B12: Vitamin B-12: 412 pg/mL (ref 211–911)

## 2020-09-20 LAB — HEMOGLOBIN A1C: Hgb A1c MFr Bld: 5 % (ref 4.6–6.5)

## 2020-09-20 LAB — TSH: TSH: 3.01 u[IU]/mL (ref 0.35–4.50)

## 2020-09-20 MED ORDER — IRBESARTAN-HYDROCHLOROTHIAZIDE 300-12.5 MG PO TABS: 1.0000 | ORAL_TABLET | Freq: Every day | ORAL | 3 refills | Status: DC

## 2020-09-20 MED ORDER — AMLODIPINE BESYLATE 5 MG PO TABS: 5.0000 mg | ORAL_TABLET | Freq: Every day | ORAL | 3 refills | Status: DC

## 2020-09-20 MED ORDER — POTASSIUM CHLORIDE ER 10 MEQ PO TBCR: EXTENDED_RELEASE_TABLET | ORAL | 3 refills | Status: DC

## 2020-09-20 MED ORDER — THERA-D 2000 50 MCG (2000 UT) PO TABS: ORAL_TABLET | ORAL | 99 refills | Status: AC

## 2020-09-20 MED ORDER — FUROSEMIDE 20 MG PO TABS: 20.0000 mg | ORAL_TABLET | Freq: Every day | ORAL | 3 refills | Status: DC | PRN

## 2020-09-20 NOTE — Patient Instructions (Signed)
Please take all new medication as prescribed - the amlodipine 5 mg per day  Please continue all other medications as before, and refills have been done if requested.  Please have the pharmacy call with any other refills you may need.  Please continue your efforts at being more active, low cholesterol diet, and weight control.  You are otherwise up to date with prevention measures today.  Please keep your appointments with your specialists as you may have planned  Please go to the LAB at the blood drawing area for the tests to be done  You will be contacted by phone if any changes need to be made immediately.  Otherwise, you will receive a letter about your results with an explanation, but please check with MyChart first.  Please remember to sign up for MyChart if you have not done so, as this will be important to you in the future with finding out test results, communicating by private email, and scheduling acute appointments online when needed.  Please make an Appointment to return in 6 months, or sooner if needed
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
## 2020-09-25 ENCOUNTER — Encounter: Payer: Self-pay | Admitting: Internal Medicine

## 2020-09-25 DIAGNOSIS — E559 Vitamin D deficiency, unspecified: Secondary | ICD-10-CM | POA: Insufficient documentation

## 2020-09-25 NOTE — Assessment & Plan Note (Addendum)
BP Readings from Last 3 Encounters:  09/20/20 138/86  08/08/20 130/82  07/27/20 (!) 142/82   uncontrolled, pt to continue medical treatment - cont lasix, avalide, AND add norvasc 5 qd

## 2020-09-25 NOTE — Assessment & Plan Note (Signed)
Last vitamin D Lab Results  Component Value Date   VD25OH 8.15 (L) 09/20/2020   Low, to start oral replacement

## 2020-09-25 NOTE — Assessment & Plan Note (Signed)
Stable, cont PPI

## 2020-09-25 NOTE — Assessment & Plan Note (Signed)

## 2020-09-25 NOTE — Assessment & Plan Note (Signed)
Lab Results  Component Value Date   HGBA1C 5.0 09/20/2020   Stable, pt to continue current medical treatment  - diet
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
## 2020-10-25 ENCOUNTER — Ambulatory Visit (INDEPENDENT_AMBULATORY_CARE_PROVIDER_SITE_OTHER): Payer: Medicare Other | Admitting: Obstetrics and Gynecology

## 2020-10-25 ENCOUNTER — Other Ambulatory Visit: Payer: Self-pay

## 2020-10-25 ENCOUNTER — Encounter: Payer: Self-pay | Admitting: Obstetrics and Gynecology

## 2020-10-25 VITALS — BP 150/84 | HR 108 | Ht 62.0 in | Wt 275.0 lb

## 2020-10-25 DIAGNOSIS — N813 Complete uterovaginal prolapse: Secondary | ICD-10-CM | POA: Diagnosis not present

## 2020-10-25 DIAGNOSIS — N83202 Unspecified ovarian cyst, left side: Secondary | ICD-10-CM | POA: Diagnosis not present

## 2020-10-25 DIAGNOSIS — Z4689 Encounter for fitting and adjustment of other specified devices: Secondary | ICD-10-CM
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
## 2020-11-25 ENCOUNTER — Other Ambulatory Visit: Payer: Self-pay | Admitting: Gastroenterology

## 2020-11-30 ENCOUNTER — Ambulatory Visit (INDEPENDENT_AMBULATORY_CARE_PROVIDER_SITE_OTHER): Payer: Medicare Other | Admitting: Obstetrics and Gynecology

## 2020-11-30 ENCOUNTER — Ambulatory Visit (INDEPENDENT_AMBULATORY_CARE_PROVIDER_SITE_OTHER): Payer: Medicare Other

## 2020-11-30 ENCOUNTER — Other Ambulatory Visit: Payer: Self-pay

## 2020-11-30 ENCOUNTER — Encounter: Payer: Self-pay | Admitting: Obstetrics and Gynecology

## 2020-11-30 VITALS — BP 128/82 | HR 88

## 2020-11-30 DIAGNOSIS — N9489 Other specified conditions associated with female genital organs and menstrual cycle: Secondary | ICD-10-CM

## 2020-11-30 DIAGNOSIS — N83202 Unspecified ovarian cyst, left side: Secondary | ICD-10-CM | POA: Diagnosis not present

## 2020-11-30 DIAGNOSIS — N7011 Chronic salpingitis: Secondary | ICD-10-CM

## 2020-12-03 ENCOUNTER — Encounter: Payer: Self-pay | Admitting: Obstetrics and Gynecology
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
## 2021-01-25 ENCOUNTER — Other Ambulatory Visit: Payer: Self-pay

## 2021-01-25 ENCOUNTER — Ambulatory Visit (INDEPENDENT_AMBULATORY_CARE_PROVIDER_SITE_OTHER): Payer: Medicare Other | Admitting: Obstetrics and Gynecology

## 2021-01-25 ENCOUNTER — Encounter: Payer: Self-pay | Admitting: Obstetrics and Gynecology

## 2021-01-25 VITALS — BP 130/74 | HR 103 | Ht 62.0 in | Wt 275.0 lb

## 2021-01-25 DIAGNOSIS — N813 Complete uterovaginal prolapse: Secondary | ICD-10-CM

## 2021-01-25 DIAGNOSIS — Z4689 Encounter for fitting and adjustment of other specified devices: Secondary | ICD-10-CM

## 2021-02-01 ENCOUNTER — Emergency Department (HOSPITAL_BASED_OUTPATIENT_CLINIC_OR_DEPARTMENT_OTHER): Admission: EM | Admit: 2021-02-01 | Payer: Medicare Other | Source: Home / Self Care

## 2021-02-22 ENCOUNTER — Other Ambulatory Visit: Payer: Self-pay | Admitting: Gastroenterology

## 2021-03-22 ENCOUNTER — Encounter: Payer: Self-pay | Admitting: Internal Medicine

## 2021-03-22 ENCOUNTER — Other Ambulatory Visit: Payer: Self-pay

## 2021-03-22 ENCOUNTER — Ambulatory Visit (INDEPENDENT_AMBULATORY_CARE_PROVIDER_SITE_OTHER): Payer: Medicare Other | Admitting: Internal Medicine

## 2021-03-22 VITALS — BP 132/76 | HR 88 | Temp 98.6°F | Ht 62.0 in | Wt 241.0 lb

## 2021-03-22 DIAGNOSIS — E7800 Pure hypercholesterolemia, unspecified: Secondary | ICD-10-CM

## 2021-03-22 DIAGNOSIS — Z23 Encounter for immunization: Secondary | ICD-10-CM | POA: Diagnosis not present

## 2021-03-22 DIAGNOSIS — R739 Hyperglycemia, unspecified: Secondary | ICD-10-CM

## 2021-03-22 DIAGNOSIS — R634 Abnormal weight loss: Secondary | ICD-10-CM

## 2021-03-22 DIAGNOSIS — E559 Vitamin D deficiency, unspecified: Secondary | ICD-10-CM

## 2021-03-22 DIAGNOSIS — E785 Hyperlipidemia, unspecified: Secondary | ICD-10-CM | POA: Insufficient documentation

## 2021-03-22 DIAGNOSIS — I10 Essential (primary) hypertension: Secondary | ICD-10-CM

## 2021-03-22 LAB — BASIC METABOLIC PANEL
BUN: 14 mg/dL (ref 6–23)
CO2: 30 mEq/L (ref 19–32)
Calcium: 9.3 mg/dL (ref 8.4–10.5)
Chloride: 103 mEq/L (ref 96–112)
Creatinine, Ser: 0.92 mg/dL (ref 0.40–1.20)
GFR: 62.5 mL/min (ref >60.00)
Glucose, Bld: 89 mg/dL (ref 70–99)
Potassium: 3.6 mEq/L (ref 3.5–5.1)
Sodium: 141 mEq/L (ref 135–145)

## 2021-03-22 LAB — HEPATIC FUNCTION PANEL
ALT: 11 U/L (ref 0–35)
AST: 16 U/L (ref 0–37)
Albumin: 4 g/dL (ref 3.5–5.2)
Alkaline Phosphatase: 71 U/L (ref 39–117)
Bilirubin, Direct: 0.2 mg/dL (ref 0.0–0.3)
Total Bilirubin: 1.2 mg/dL (ref 0.2–1.2)
Total Protein: 7.7 g/dL (ref 6.0–8.3)

## 2021-03-22 LAB — VITAMIN D 25 HYDROXY (VIT D DEFICIENCY, FRACTURES): VITD: 29.42 ng/mL — ABNORMAL LOW (ref 30.00–100.00)

## 2021-03-22 LAB — LIPID PANEL
Cholesterol: 147 mg/dL (ref 0–200)
HDL: 45.9 mg/dL (ref >39.00)
LDL Cholesterol: 88 mg/dL (ref 0–99)
NonHDL: 100.77
Total CHOL/HDL Ratio: 3
Triglycerides: 66 mg/dL (ref 0.0–149.0)
VLDL: 13.2 mg/dL (ref 0.0–40.0)

## 2021-03-22 LAB — HEMOGLOBIN A1C: Hgb A1c MFr Bld: 5.1 % (ref 4.6–6.5)

## 2021-03-22 NOTE — Assessment & Plan Note (Addendum)
Lab Results  Component Value Date   LDLCALC 100 (H) 09/20/2020   Mild uncontrolled, pt to continue current lower chol diet, exercise, wt control, check lipid today, and check card CT score

## 2021-03-22 NOTE — Patient Instructions (Addendum)
We have discussed the Cardiac CT Score test to measure the calcification level (if any) in your heart arteries.  This test has been ordered in our [facility], so please call [facility] directly, as they prefer this, at [phone] to be scheduled.  Please consider the Novavax covid vaccine when you find out more about it, maybe instead of taking the boosters  Please continue all other medications as before, and refills have been done if requested.  Please have the pharmacy call with any other refills you may need.  Please continue your efforts at being more active, low cholesterol diet, and weight control.  Please keep your appointments with your specialists as you may have planned  Please go to the LAB at the blood drawing area for the tests to be done  You will be contacted by phone if any changes need to be made immediately.  Otherwise, you will receive a letter about your results with an explanation, but please check with MyChart first.  Please remember to sign up for MyChart if you have not done so, as this will be important to you in the future with finding out test results, communicating by private email, and scheduling acute appointments online when needed.  Please make an Appointment to return in 6 months, or sooner if needed
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
## 2021-03-24 NOTE — Assessment & Plan Note (Signed)
Lab Results  Component Value Date   HGBA1C 5.1 03/22/2021   Stable, pt to continue current medical treatment  - diet

## 2021-03-24 NOTE — Assessment & Plan Note (Signed)
Intentional per pt, cont to follow

## 2021-03-24 NOTE — Assessment & Plan Note (Signed)
BP Readings from Last 3 Encounters:  03/22/21 132/76  01/25/21 130/74  11/30/20 128/82   Stable, pt to continue medical treatment norvac, avalide

## 2021-03-24 NOTE — Assessment & Plan Note (Signed)
Last vitamin D Lab Results  Component Value Date   VD25OH 29.42 (L) 03/22/2021   Low, to start oral replacement

## 2021-05-02 ENCOUNTER — Other Ambulatory Visit: Payer: Self-pay

## 2021-05-02 ENCOUNTER — Encounter: Payer: Self-pay | Admitting: Obstetrics and Gynecology

## 2021-05-02 ENCOUNTER — Ambulatory Visit (INDEPENDENT_AMBULATORY_CARE_PROVIDER_SITE_OTHER): Payer: Medicare Other | Admitting: Obstetrics and Gynecology

## 2021-05-02 VITALS — BP 122/70 | HR 84 | Resp 16 | Ht 62.0 in | Wt 241.0 lb

## 2021-05-02 DIAGNOSIS — Z4689 Encounter for fitting and adjustment of other specified devices: Secondary | ICD-10-CM | POA: Diagnosis not present

## 2021-05-02 DIAGNOSIS — N813 Complete uterovaginal prolapse: Secondary | ICD-10-CM
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
## 2021-05-29 ENCOUNTER — Other Ambulatory Visit: Payer: Self-pay | Admitting: Gastroenterology

## 2021-06-01 ENCOUNTER — Telehealth: Payer: Self-pay | Admitting: Gastroenterology

## 2021-06-01 MED ORDER — PANTOPRAZOLE SODIUM 40 MG PO TBEC: DELAYED_RELEASE_TABLET | ORAL | 1 refills | Status: DC

## 2021-06-01 NOTE — Telephone Encounter (Signed)
Inbound call from patient requesting refill for pantoprazole sent to [facility] on [address]

## 2021-06-01 NOTE — Telephone Encounter (Signed)
Refills for Pantoprazole has been sent to [facility] on [address]. Pt made aware that she must keep follow up appointment in Jan 2023.

## 2021-06-14 ENCOUNTER — Encounter: Payer: Self-pay | Admitting: Internal Medicine

## 2021-06-14 ENCOUNTER — Telehealth: Payer: Self-pay | Admitting: Internal Medicine

## 2021-06-14 MED ORDER — NIRMATRELVIR/RITONAVIR (PAXLOVID)TABLET: 3.0000 | ORAL_TABLET | Freq: Two times a day (BID) | ORAL | 0 refills | Status: AC

## 2021-06-14 MED ORDER — HYDROCODONE BIT-HOMATROP MBR 5-1.5 MG/5ML PO SOLN: 5.0000 mL | Freq: Four times a day (QID) | ORAL | 0 refills | Status: AC | PRN

## 2021-06-14 NOTE — Telephone Encounter (Signed)
Patient states she tested positive for Covid on 12-29  Patient requesting a rx for antivirals  Encouraged patient to schedule vv, patient declined stating she is not in town  Pharmacy: [facility], [address], [place] [phone]

## 2021-06-14 NOTE — Telephone Encounter (Signed)
[place] for [employer] and hycodan to the new pharmacy

## 2021-06-14 NOTE — Telephone Encounter (Signed)
Patient notified

## 2021-07-06 ENCOUNTER — Ambulatory Visit: Payer: Medicare PPO | Admitting: Gastroenterology

## 2021-07-06 ENCOUNTER — Encounter: Payer: Self-pay | Admitting: Gastroenterology

## 2021-07-06 VITALS — BP 120/70 | HR 88 | Ht 63.0 in | Wt 236.0 lb

## 2021-07-06 DIAGNOSIS — K635 Polyp of colon: Secondary | ICD-10-CM | POA: Diagnosis not present

## 2021-07-06 DIAGNOSIS — K219 Gastro-esophageal reflux disease without esophagitis: Secondary | ICD-10-CM

## 2021-07-06 DIAGNOSIS — K439 Ventral hernia without obstruction or gangrene: Secondary | ICD-10-CM | POA: Diagnosis not present

## 2021-07-06 DIAGNOSIS — K449 Diaphragmatic hernia without obstruction or gangrene: Secondary | ICD-10-CM

## 2021-07-06 DIAGNOSIS — Z86010 Personal history of colonic polyps: Secondary | ICD-10-CM

## 2021-07-06 MED ORDER — PANTOPRAZOLE SODIUM 40 MG PO TBEC: DELAYED_RELEASE_TABLET | ORAL | 3 refills | Status: DC

## 2021-07-06 NOTE — Patient Instructions (Signed)
If you are age 73 or older, your body mass index should be between 23-30. Your Body mass index is 41.81 kg/m. If this is out of the aforementioned range listed, please consider follow up with your Primary Care Provider.  If you are age 46 or younger, your body mass index should be between 19-25. Your Body mass index is 41.81 kg/m. If this is out of the aformentioned range listed, please consider follow up with your Primary Care Provider.   ________________________________________________________  The [facility] providers would like to encourage you to use [facility] to communicate with providers for non-urgent requests or questions.  Due to long hold times on the telephone, sending your provider a message by [facility] may be a faster and more efficient way to get a response.  Please allow 48 business hours for a response.  Please remember that this is for non-urgent requests.  _______________________________________________________  Try to decrease your PPI to once a day for a few weeks.  Send a mychart message to Dr. Rush Landmark to let him know how you are doing.  Please follow up in one year

## 2021-07-07 ENCOUNTER — Encounter: Payer: Self-pay | Admitting: Gastroenterology

## 2021-07-07 DIAGNOSIS — Z86010 Personal history of colonic polyps: Secondary | ICD-10-CM | POA: Insufficient documentation
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
## 2021-08-29 ENCOUNTER — Other Ambulatory Visit: Payer: Self-pay

## 2021-08-29 ENCOUNTER — Encounter: Payer: Self-pay | Admitting: Obstetrics and Gynecology

## 2021-08-29 ENCOUNTER — Ambulatory Visit (INDEPENDENT_AMBULATORY_CARE_PROVIDER_SITE_OTHER): Payer: Medicare PPO | Admitting: Obstetrics and Gynecology

## 2021-08-29 VITALS — BP 120/84 | HR 96 | Ht 62.0 in | Wt 241.0 lb

## 2021-08-29 DIAGNOSIS — Z4689 Encounter for fitting and adjustment of other specified devices: Secondary | ICD-10-CM | POA: Diagnosis not present

## 2021-08-29 DIAGNOSIS — N83202 Unspecified ovarian cyst, left side: Secondary | ICD-10-CM

## 2021-08-29 DIAGNOSIS — N813 Complete uterovaginal prolapse: Secondary | ICD-10-CM | POA: Diagnosis not present
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
## 2021-09-21 ENCOUNTER — Other Ambulatory Visit: Payer: Self-pay | Admitting: Internal Medicine

## 2021-09-21 NOTE — Telephone Encounter (Signed)
Please refill as per office routine med refill policy (all routine meds to be refilled for 3 mo or monthly (per pt preference) up to one year from last visit, then month to month grace period for 3 mo, then further med refills will have to be denied) ? ?

## 2021-09-25 ENCOUNTER — Ambulatory Visit (INDEPENDENT_AMBULATORY_CARE_PROVIDER_SITE_OTHER): Payer: Medicare PPO | Admitting: Internal Medicine

## 2021-09-25 ENCOUNTER — Encounter: Payer: Self-pay | Admitting: Internal Medicine

## 2021-09-25 VITALS — BP 118/80 | HR 92 | Temp 98.4°F | Ht 62.0 in | Wt 230.0 lb

## 2021-09-25 DIAGNOSIS — E7800 Pure hypercholesterolemia, unspecified: Secondary | ICD-10-CM

## 2021-09-25 DIAGNOSIS — E559 Vitamin D deficiency, unspecified: Secondary | ICD-10-CM

## 2021-09-25 DIAGNOSIS — E538 Deficiency of other specified B group vitamins: Secondary | ICD-10-CM

## 2021-09-25 DIAGNOSIS — I10 Essential (primary) hypertension: Secondary | ICD-10-CM | POA: Diagnosis not present

## 2021-09-25 DIAGNOSIS — R739 Hyperglycemia, unspecified: Secondary | ICD-10-CM | POA: Diagnosis not present

## 2021-09-25 LAB — URINALYSIS, ROUTINE W REFLEX MICROSCOPIC
Bilirubin Urine: NEGATIVE
Hgb urine dipstick: NEGATIVE
Ketones, ur: NEGATIVE
Nitrite: NEGATIVE
RBC / HPF: NONE SEEN (ref 0–?)
Specific Gravity, Urine: 1.01 (ref 1.000–1.030)
Total Protein, Urine: NEGATIVE
Urine Glucose: NEGATIVE
Urobilinogen, UA: 0.2 (ref 0.0–1.0)
pH: 6 (ref 5.0–8.0)

## 2021-09-25 LAB — BASIC METABOLIC PANEL
BUN: 25 mg/dL — ABNORMAL HIGH (ref 6–23)
CO2: 30 mEq/L (ref 19–32)
Calcium: 9.8 mg/dL (ref 8.4–10.5)
Chloride: 103 mEq/L (ref 96–112)
Creatinine, Ser: 0.89 mg/dL (ref 0.40–1.20)
GFR: 64.8 mL/min (ref >60.00)
Glucose, Bld: 92 mg/dL (ref 70–99)
Potassium: 4.3 mEq/L (ref 3.5–5.1)
Sodium: 140 mEq/L (ref 135–145)

## 2021-09-25 LAB — LIPID PANEL
Cholesterol: 155 mg/dL (ref 0–200)
HDL: 53 mg/dL (ref >39.00)
LDL Cholesterol: 90 mg/dL (ref 0–99)
NonHDL: 101.59
Total CHOL/HDL Ratio: 3
Triglycerides: 60 mg/dL (ref 0.0–149.0)
VLDL: 12 mg/dL (ref 0.0–40.0)

## 2021-09-25 LAB — CBC WITH DIFFERENTIAL/PLATELET
Basophils Absolute: 0 10*3/uL (ref 0.0–0.1)
Basophils Relative: 0.7 % (ref 0.0–3.0)
Eosinophils Absolute: 0.1 10*3/uL (ref 0.0–0.7)
Eosinophils Relative: 1.6 % (ref 0.0–5.0)
HCT: 39.2 % (ref 36.0–46.0)
Hemoglobin: 13.3 g/dL (ref 12.0–15.0)
Lymphocytes Relative: 29.7 % (ref 12.0–46.0)
Lymphs Abs: 2 10*3/uL (ref 0.7–4.0)
MCHC: 33.9 g/dL (ref 30.0–36.0)
MCV: 92.5 fl (ref 78.0–100.0)
Monocytes Absolute: 0.5 10*3/uL (ref 0.1–1.0)
Monocytes Relative: 7 % (ref 3.0–12.0)
Neutro Abs: 4.1 10*3/uL (ref 1.4–7.7)
Neutrophils Relative %: 61 % (ref 43.0–77.0)
Platelets: 225 10*3/uL (ref 150.0–400.0)
RBC: 4.23 Mil/uL (ref 3.87–5.11)
RDW: 13.1 % (ref 11.5–15.5)
WBC: 6.6 10*3/uL (ref 4.0–10.5)

## 2021-09-25 LAB — HEPATIC FUNCTION PANEL
ALT: 11 U/L (ref 0–35)
AST: 15 U/L (ref 0–37)
Albumin: 4.2 g/dL (ref 3.5–5.2)
Alkaline Phosphatase: 65 U/L (ref 39–117)
Bilirubin, Direct: 0.2 mg/dL (ref 0.0–0.3)
Total Bilirubin: 1.3 mg/dL — ABNORMAL HIGH (ref 0.2–1.2)
Total Protein: 8 g/dL (ref 6.0–8.3)

## 2021-09-25 LAB — HEMOGLOBIN A1C: Hgb A1c MFr Bld: 5.1 % (ref 4.6–6.5)

## 2021-09-25 LAB — VITAMIN D 25 HYDROXY (VIT D DEFICIENCY, FRACTURES): VITD: 33.18 ng/mL (ref 30.00–100.00)

## 2021-09-25 LAB — VITAMIN B12: Vitamin B-12: 448 pg/mL (ref 211–911)

## 2021-09-25 LAB — TSH: TSH: 2.05 u[IU]/mL (ref 0.35–5.50)

## 2021-09-25 MED ORDER — IRBESARTAN-HYDROCHLOROTHIAZIDE 300-12.5 MG PO TABS: 1.0000 | ORAL_TABLET | Freq: Every day | ORAL | 3 refills | Status: DC

## 2021-09-25 MED ORDER — POTASSIUM CHLORIDE ER 10 MEQ PO TBCR: EXTENDED_RELEASE_TABLET | ORAL | 3 refills | Status: DC
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
## 2021-09-25 NOTE — Assessment & Plan Note (Signed)
Last vitamin D ?Lab Results  ?Component Value Date  ? VD25OH 29.42 (L) 03/22/2021  ? ?Low, to start oral replacement ? ?

## 2021-09-25 NOTE — Patient Instructions (Signed)

## 2021-09-27 ENCOUNTER — Other Ambulatory Visit: Payer: Self-pay | Admitting: Internal Medicine

## 2021-09-27 NOTE — Telephone Encounter (Signed)
Please refill as per office routine med refill policy (all routine meds to be refilled for 3 mo or monthly (per pt preference) up to one year from last visit, then month to month grace period for 3 mo, then further med refills will have to be denied) ? ?

## 2021-09-30 ENCOUNTER — Encounter: Payer: Self-pay | Admitting: Internal Medicine

## 2021-09-30 NOTE — Assessment & Plan Note (Signed)
BP Readings from Last 3 Encounters:  ?09/25/21 118/80  ?08/29/21 120/84  ?07/06/21 120/70  ? ?Stable, pt to continue medical treatment amlodipine, avalide ? ?

## 2021-09-30 NOTE — Assessment & Plan Note (Signed)
Lab Results  ?Component Value Date  ? HGBA1C 5.1 09/25/2021  ? ?Stable, pt to continue current medical treatment   - diet ? ?

## 2021-09-30 NOTE — Assessment & Plan Note (Signed)
Lab Results  ?Component Value Date  ? [place] 90 09/25/2021  ? ?Stable, pt to continue current low cho diet, declines statin ? ?

## 2021-10-19 ENCOUNTER — Telehealth: Payer: Self-pay | Admitting: Internal Medicine

## 2021-10-19 MED ORDER — FUROSEMIDE 20 MG PO TABS
20.0000 mg | ORAL_TABLET | Freq: Every day | ORAL | 3 refills | Status: DC | PRN
Start: 2021-10-19 — End: 2022-11-13

## 2021-10-19 NOTE — Telephone Encounter (Signed)
1.Medication Requested: furosemide (LASIX) 20 MG tablet ? ?2. Pharmacy (Name, Street, [place]): [facility] [phone] - [place], [address] ? ?3. On Med List: Y ? ?4. Last Visit with PCP: 09-25-2021 ? ?5. Next visit date with PCP: 03-27-2022 ? ? ?Agent: Please be advised that RX refills may take up to 3 business days. We ask that you follow-up with your pharmacy.  ?

## 2021-11-07 ENCOUNTER — Ambulatory Visit (INDEPENDENT_AMBULATORY_CARE_PROVIDER_SITE_OTHER): Payer: Medicare PPO

## 2021-11-07 DIAGNOSIS — Z0000 Encounter for general adult medical examination without abnormal findings: Secondary | ICD-10-CM
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
## 2021-11-07 NOTE — Patient Instructions (Addendum)
Veronica [name] , Thank you for taking time to come for your Medicare Wellness Visit. I appreciate your ongoing commitment to your health goals. Please review the following plan we discussed and let me know if I can assist you in the future.   Screening recommendations/referrals: Colonoscopy: 09/07/2019; due every 7 years Mammogram: 01/27/2020; due every 1-2 years (Patient will schedule with OB/GYN) Bone Density: 12/26/2016; normal results Recommended yearly ophthalmology/optometry visit for glaucoma screening and checkup Recommended yearly dental visit for hygiene and checkup  Vaccinations: Influenza vaccine: 03/22/2021 Pneumococcal vaccine: 12/13/2014, 12/14/2015 Tdap vaccine: 12/27/2010; due every 10 years  Shingles vaccine: Never done Zoster vaccine: 11/27/2012   Covid-19: 07/09/2019, 07/30/2019, 04/05/2020, 10/17/2020, 04/04/2021  Advanced directives: No  Conditions/risks identified: Yes  Next appointment: 11/12/2022 at 1:45 p.m. telephone visit with Mignon Pine, Nurse Health Advisor.  If you need to reschedule or cancel, please call [phone].   Preventive Care 73 Years and Older, Female Preventive care refers to lifestyle choices and visits with your health care provider that can promote health and wellness. What does preventive care include? A yearly physical exam. This is also called an annual well check. Dental exams once or twice a year. Routine eye exams. Ask your health care provider how often you should have your eyes checked. Personal lifestyle choices, including: Daily care of your teeth and gums. Regular physical activity. Eating a healthy diet. Avoiding tobacco and drug use. Limiting alcohol use. Practicing safe sex. Taking low-dose aspirin every day. Taking vitamin and mineral supplements as recommended by your health care provider. What happens during an annual well check? The services and screenings done by your health care provider during your annual well check will  depend on your age, overall health, lifestyle risk factors, and family history of disease. Counseling  Your health care provider may ask you questions about your: Alcohol use. Tobacco use. Drug use. Emotional well-being. Home and relationship well-being. Sexual activity. Eating habits. History of falls. Memory and ability to understand (cognition). Work and work Statistician. Reproductive health. Screening  You may have the following tests or measurements: Height, weight, and BMI. Blood pressure. Lipid and cholesterol levels. These may be checked every 5 years, or more frequently if you are over 39 years old. Skin check. Lung cancer screening. You may have this screening every year starting at age 40 if you have a 30-pack-year history of smoking and currently smoke or have quit within the past 15 years. Fecal occult blood test (FOBT) of the stool. You may have this test every year starting at age 25. Flexible sigmoidoscopy or colonoscopy. You may have a sigmoidoscopy every 5 years or a colonoscopy every 10 years starting at age 40. Hepatitis C blood test. Hepatitis B blood test. Sexually transmitted disease (STD) testing. Diabetes screening. This is done by checking your blood sugar (glucose) after you have not eaten for a while (fasting). You may have this done every 1-3 years. Bone density scan. This is done to screen for osteoporosis. You may have this done starting at age 38. Mammogram. This may be done every 1-2 years. Talk to your health care provider about how often you should have regular mammograms. Talk with your health care provider about your test results, treatment options, and if necessary, the need for more tests. Vaccines  Your health care provider may recommend certain vaccines, such as: Influenza vaccine. This is recommended every year. Tetanus, diphtheria, and acellular pertussis (Tdap, Td) vaccine. You may need a Td booster every 10 years. Zoster vaccine. You  may  need this after age 31. Pneumococcal 13-valent conjugate (PCV13) vaccine. One dose is recommended after age 37. Pneumococcal polysaccharide (PPSV23) vaccine. One dose is recommended after age 54. Talk to your health care provider about which screenings and vaccines you need and how often you need them. This information is not intended to replace advice given to you by your health care provider. Make sure you discuss any questions you have with your health care provider. Document Released: 06/30/2015 Document Revised: 02/21/2016 Document Reviewed: 04/04/2015 Elsevier Interactive Patient Education  2017 [place] Prevention in the Home Falls can cause injuries. They can happen to people of all ages. There are many things you can do to make your home safe and to help prevent falls. What can I do on the outside of my home? Regularly fix the edges of walkways and driveways and fix any cracks. Remove anything that might make you trip as you walk through a door, such as a raised step or threshold. Trim any bushes or trees on the path to your home. Use bright outdoor lighting. Clear any walking paths of anything that might make someone trip, such as rocks or tools. Regularly check to see if handrails are loose or broken. Make sure that both sides of any steps have handrails. Any raised decks and porches should have guardrails on the edges. Have any leaves, snow, or ice cleared regularly. Use sand or salt on walking paths during winter. Clean up any spills in your garage right away. This includes oil or grease spills. What can I do in the bathroom? Use night lights. Install grab bars by the toilet and in the tub and shower. Do not use towel bars as grab bars. Use non-skid mats or decals in the tub or shower. If you need to sit down in the shower, use a plastic, non-slip stool. Keep the floor dry. Clean up any water that spills on the floor as soon as it happens. Remove soap buildup in  the tub or shower regularly. Attach bath mats securely with double-sided non-slip rug tape. Do not have throw rugs and other things on the floor that can make you trip. What can I do in the bedroom? Use night lights. Make sure that you have a light by your bed that is easy to reach. Do not use any sheets or blankets that are too big for your bed. They should not hang down onto the floor. Have a firm chair that has side arms. You can use this for support while you get dressed. Do not have throw rugs and other things on the floor that can make you trip. What can I do in the kitchen? Clean up any spills right away. Avoid walking on wet floors. Keep items that you use a lot in easy-to-reach places. If you need to reach something above you, use a strong step stool that has a grab bar. Keep electrical cords out of the way. Do not use floor polish or wax that makes floors slippery. If you must use wax, use non-skid floor wax. Do not have throw rugs and other things on the floor that can make you trip. What can I do with my stairs? Do not leave any items on the stairs. Make sure that there are handrails on both sides of the stairs and use them. Fix handrails that are broken or loose. Make sure that handrails are as long as the stairways. Check any carpeting to make sure that it is firmly  attached to the stairs. Fix any carpet that is loose or worn. Avoid having throw rugs at the top or bottom of the stairs. If you do have throw rugs, attach them to the floor with carpet tape. Make sure that you have a light switch at the top of the stairs and the bottom of the stairs. If you do not have them, ask someone to add them for you. What else can I do to help prevent falls? Wear shoes that: Do not have high heels. Have rubber bottoms. Are comfortable and fit you well. Are closed at the toe. Do not wear sandals. If you use a stepladder: Make sure that it is fully opened. Do not climb a closed  stepladder. Make sure that both sides of the stepladder are locked into place. Ask someone to hold it for you, if possible. Clearly mark and make sure that you can see: Any grab bars or handrails. First and last steps. Where the edge of each step is. Use tools that help you move around (mobility aids) if they are needed. These include: Canes. Walkers. Scooters. Crutches. Turn on the lights when you go into a dark area. Replace any light bulbs as soon as they burn out. Set up your furniture so you have a clear path. Avoid moving your furniture around. If any of your floors are uneven, fix them. If there are any pets around you, be aware of where they are. Review your medicines with your doctor. Some medicines can make you feel dizzy. This can increase your chance of falling. Ask your doctor what other things that you can do to help prevent falls. This information is not intended to replace advice given to you by your health care provider. Make sure you discuss any questions you have with your health care provider. Document Released: 03/30/2009 Document Revised: 11/09/2015 Document Reviewed: 07/08/2014 Elsevier Interactive Patient Education  2017 [employer].

## 2021-11-09 ENCOUNTER — Other Ambulatory Visit: Payer: Self-pay | Admitting: Obstetrics and Gynecology

## 2021-11-09 DIAGNOSIS — Z1231 Encounter for screening mammogram for malignant neoplasm of breast: Secondary | ICD-10-CM

## 2021-11-16 ENCOUNTER — Ambulatory Visit
Admission: RE | Admit: 2021-11-16 | Discharge: 2021-11-16 | Disposition: A | Discharge status: Home / Self Care | Payer: Medicare PPO | Source: Ambulatory Visit | Attending: Obstetrics and Gynecology | Admitting: Obstetrics and Gynecology

## 2021-11-16 DIAGNOSIS — Z1231 Encounter for screening mammogram for malignant neoplasm of breast: Secondary | ICD-10-CM | POA: Diagnosis not present
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
## 2021-12-05 ENCOUNTER — Other Ambulatory Visit: Payer: Self-pay

## 2021-12-05 DIAGNOSIS — N83202 Unspecified ovarian cyst, left side: Secondary | ICD-10-CM

## 2021-12-05 DIAGNOSIS — N7011 Chronic salpingitis: Secondary | ICD-10-CM

## 2021-12-06 ENCOUNTER — Ambulatory Visit (INDEPENDENT_AMBULATORY_CARE_PROVIDER_SITE_OTHER): Payer: Medicare PPO | Admitting: Obstetrics and Gynecology

## 2021-12-06 ENCOUNTER — Ambulatory Visit (INDEPENDENT_AMBULATORY_CARE_PROVIDER_SITE_OTHER): Payer: Medicare PPO

## 2021-12-06 ENCOUNTER — Encounter: Payer: Self-pay | Admitting: Obstetrics and Gynecology

## 2021-12-06 VITALS — BP 130/72 | Ht 62.0 in | Wt 241.0 lb

## 2021-12-06 DIAGNOSIS — N83202 Unspecified ovarian cyst, left side: Secondary | ICD-10-CM

## 2021-12-06 DIAGNOSIS — N7011 Chronic salpingitis: Secondary | ICD-10-CM

## 2021-12-06 DIAGNOSIS — N813 Complete uterovaginal prolapse: Secondary | ICD-10-CM | POA: Diagnosis not present

## 2021-12-06 DIAGNOSIS — Z4689 Encounter for fitting and adjustment of other specified devices: Secondary | ICD-10-CM | POA: Diagnosis not present

## 2022-03-27 ENCOUNTER — Ambulatory Visit: Payer: Medicare PPO | Admitting: Internal Medicine

## 2022-04-10 ENCOUNTER — Ambulatory Visit: Payer: Medicare Other | Admitting: Obstetrics and Gynecology

## 2022-04-11 ENCOUNTER — Ambulatory Visit: Payer: Medicare PPO | Admitting: Internal Medicine

## 2022-04-11 VITALS — BP 122/78 | HR 86 | Temp 97.9°F | Ht 62.0 in | Wt 231.0 lb

## 2022-04-11 DIAGNOSIS — I10 Essential (primary) hypertension: Secondary | ICD-10-CM | POA: Diagnosis not present

## 2022-04-11 DIAGNOSIS — H538 Other visual disturbances: Secondary | ICD-10-CM

## 2022-04-11 DIAGNOSIS — R739 Hyperglycemia, unspecified: Secondary | ICD-10-CM

## 2022-04-11 DIAGNOSIS — E559 Vitamin D deficiency, unspecified: Secondary | ICD-10-CM

## 2022-04-11 DIAGNOSIS — Z0001 Encounter for general adult medical examination with abnormal findings: Secondary | ICD-10-CM

## 2022-04-11 DIAGNOSIS — E7800 Pure hypercholesterolemia, unspecified: Secondary | ICD-10-CM

## 2022-04-11 MED ORDER — OXYBUTYNIN CHLORIDE ER 10 MG PO TB24: 10.0000 mg | ORAL_TABLET | Freq: Every day | ORAL | 3 refills | Status: DC

## 2022-04-11 MED ORDER — TOLTERODINE TARTRATE ER 4 MG PO CP24: 4.0000 mg | ORAL_CAPSULE | Freq: Every day | ORAL | 3 refills | Status: DC

## 2022-04-11 NOTE — Assessment & Plan Note (Signed)
Last vitamin D Lab Results  Component Value Date   VD25OH 33.18 09/25/2021   Low, reminded to start replacement

## 2022-04-11 NOTE — Patient Instructions (Addendum)
Please have your Shingrix (shingles) shots done at your local pharmacy.  Please continue all other medications as before, and refills have been done if requested.  Please have the pharmacy call with any other refills you may need.  Please continue your efforts at being more active, low cholesterol diet, and weight control.  You are otherwise up to date with prevention measures today.  Please keep your appointments with your specialists as you may have planned  You will be contacted regarding the referral for: Cardiac CT score  You will be contacted regarding the referral for: Dr Katy Fitch (eyes)  We can hold on lab testing today  Please make an Appointment to return in 6 months, or sooner if needed

## 2022-04-13 ENCOUNTER — Encounter: Payer: Self-pay | Admitting: Internal Medicine

## 2022-04-13 DIAGNOSIS — H538 Other visual disturbances: Secondary | ICD-10-CM | POA: Insufficient documentation

## 2022-04-13 NOTE — Assessment & Plan Note (Signed)
Worsening mild x 2 mo, etiology unclear, for optho referral

## 2022-04-13 NOTE — Assessment & Plan Note (Signed)
Lab Results  Component Value Date   LDLCALC 90 09/25/2021   Stable, pt to continue current low chol diet, also check Cardac CT score
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
## 2022-04-13 NOTE — Assessment & Plan Note (Signed)
Lab Results  Component Value Date   HGBA1C 5.1 09/25/2021   Stable, pt to continue current medical treatment  - diet, wt control, excercise

## 2022-04-13 NOTE — Assessment & Plan Note (Signed)
.   BP Readings from Last 3 Encounters:  04/11/22 122/78  12/06/21 130/72  09/25/21 118/80   Stable, pt to continue medical treatment norvasc 5 mg qd, avalide 300 - 12.5 qd

## 2022-04-13 NOTE — Assessment & Plan Note (Signed)
Age and sex appropriate education and counseling updated with regular exercise and diet Referrals for preventative services - none needed Immunizations addressed - declines shingrix, tdap Smoking counseling  - none needed Evidence for depression or other mood disorder - none significant Most recent labs reviewed. I have personally reviewed and have noted: 1) the patient's medical and social history 2) The patient's current medications and supplements 3) The patient's height, weight, and BMI have been recorded in the chart

## 2022-04-18 ENCOUNTER — Encounter: Payer: Self-pay | Admitting: Obstetrics and Gynecology

## 2022-04-18 ENCOUNTER — Ambulatory Visit (INDEPENDENT_AMBULATORY_CARE_PROVIDER_SITE_OTHER): Payer: Medicare PPO | Admitting: Obstetrics and Gynecology

## 2022-04-18 VITALS — BP 124/82 | HR 80 | Ht 62.0 in | Wt 224.0 lb

## 2022-04-18 DIAGNOSIS — N813 Complete uterovaginal prolapse: Secondary | ICD-10-CM

## 2022-04-18 DIAGNOSIS — Z4689 Encounter for fitting and adjustment of other specified devices: Secondary | ICD-10-CM | POA: Diagnosis not present

## 2022-04-18 DIAGNOSIS — N3281 Overactive bladder: Secondary | ICD-10-CM | POA: Diagnosis not present
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
## 2022-04-18 NOTE — Patient Instructions (Addendum)
Consider Myrbetriq 50 mg daily OR Gemtesa 75 mg daily instead of using the Ditropan XL and the Detrol LA.

## 2022-04-29 ENCOUNTER — Other Ambulatory Visit: Payer: Self-pay | Admitting: Internal Medicine

## 2022-04-29 ENCOUNTER — Ambulatory Visit (HOSPITAL_COMMUNITY)
Admission: RE | Admit: 2022-04-29 | Discharge: 2022-04-29 | Disposition: A | Discharge status: Home / Self Care | Payer: Medicare PPO | Source: Ambulatory Visit | Attending: Internal Medicine | Admitting: Internal Medicine

## 2022-04-29 ENCOUNTER — Encounter (HOSPITAL_COMMUNITY): Payer: Self-pay

## 2022-04-29 DIAGNOSIS — I10 Essential (primary) hypertension: Secondary | ICD-10-CM | POA: Insufficient documentation

## 2022-04-29 DIAGNOSIS — R739 Hyperglycemia, unspecified: Secondary | ICD-10-CM | POA: Insufficient documentation

## 2022-04-29 DIAGNOSIS — E7800 Pure hypercholesterolemia, unspecified: Secondary | ICD-10-CM | POA: Insufficient documentation

## 2022-04-29 DIAGNOSIS — R931 Abnormal findings on diagnostic imaging of heart and coronary circulation: Secondary | ICD-10-CM

## 2022-04-29 MED ORDER — ROSUVASTATIN CALCIUM 10 MG PO TABS: 10.0000 mg | ORAL_TABLET | Freq: Every day | ORAL | 3 refills | Status: DC

## 2022-04-29 MED ORDER — ASPIRIN 81 MG PO TBEC: 81.0000 mg | DELAYED_RELEASE_TABLET | Freq: Every day | ORAL | 12 refills | Status: DC

## 2022-05-15 DIAGNOSIS — H5010 Unspecified exotropia: Secondary | ICD-10-CM | POA: Diagnosis not present

## 2022-05-15 DIAGNOSIS — H25813 Combined forms of age-related cataract, bilateral: Secondary | ICD-10-CM | POA: Diagnosis not present

## 2022-05-23 ENCOUNTER — Ambulatory Visit: Payer: Medicare PPO | Attending: Cardiology | Admitting: Cardiology

## 2022-05-23 ENCOUNTER — Encounter: Payer: Self-pay | Admitting: *Deleted

## 2022-05-23 ENCOUNTER — Encounter: Payer: Self-pay | Admitting: Cardiology

## 2022-05-23 VITALS — BP 120/80 | HR 87 | Ht 62.0 in | Wt 232.0 lb

## 2022-05-23 DIAGNOSIS — E7800 Pure hypercholesterolemia, unspecified: Secondary | ICD-10-CM | POA: Diagnosis not present

## 2022-05-23 DIAGNOSIS — I10 Essential (primary) hypertension: Secondary | ICD-10-CM | POA: Diagnosis not present

## 2022-05-23 DIAGNOSIS — I2583 Coronary atherosclerosis due to lipid rich plaque: Secondary | ICD-10-CM

## 2022-05-23 DIAGNOSIS — I2510 Atherosclerotic heart disease of native coronary artery without angina pectoris: Secondary | ICD-10-CM | POA: Diagnosis not present

## 2022-05-23 DIAGNOSIS — R931 Abnormal findings on diagnostic imaging of heart and coronary circulation: Secondary | ICD-10-CM

## 2022-05-23 MED ORDER — ASPIRIN 81 MG PO TBEC: 81.0000 mg | DELAYED_RELEASE_TABLET | Freq: Every day | ORAL | 12 refills | Status: AC

## 2022-05-23 NOTE — Patient Instructions (Signed)
Medication Instructions:  Please make sure your are taking Aspirin 81 mg a day. Continue all other medications as listed.  *If you need a refill on your cardiac medications before your next appointment, please call your pharmacy*  Testing/Procedures: Your physician has requested that you have a lexiscan myoview. For further information please visit [email or web address]. Please follow instruction sheet, as given.  Follow-Up: At [facility], you and your health needs are our priority.  As part of our continuing mission to provide you with exceptional heart care, we have created designated Provider Care Teams.  These Care Teams include your primary Cardiologist (physician) and Advanced Practice Providers (APPs -  Physician Assistants and Nurse Practitioners) who all work together to provide you with the care you need, when you need it.  We recommend signing up for the patient portal called "MyChart".  Sign up information is provided on this After Visit Summary.  MyChart is used to connect with patients for Virtual Visits (Telemedicine).  Patients are able to view lab/test results, encounter notes, upcoming appointments, etc.  Non-urgent messages can be sent to your provider as well.   To learn more about what you can do with MyChart, go to [email or web address].    Your next appointment:   1 year(s)  The format for your next appointment:   In Person  Provider:   Candee Furbish, MD      Important Information About Sugar
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
## 2022-05-24 ENCOUNTER — Telehealth (HOSPITAL_COMMUNITY): Payer: Self-pay

## 2022-05-24 NOTE — Telephone Encounter (Signed)
Detailed instructions left on the patient's answering machine. Asked to call back with any questions. S.Zahm EMP

## 2022-05-28 ENCOUNTER — Ambulatory Visit (HOSPITAL_COMMUNITY): Payer: Medicare PPO | Attending: Cardiology

## 2022-05-28 DIAGNOSIS — I2583 Coronary atherosclerosis due to lipid rich plaque: Secondary | ICD-10-CM

## 2022-05-28 DIAGNOSIS — I2510 Atherosclerotic heart disease of native coronary artery without angina pectoris: Secondary | ICD-10-CM | POA: Diagnosis not present

## 2022-05-28 DIAGNOSIS — R931 Abnormal findings on diagnostic imaging of heart and coronary circulation: Secondary | ICD-10-CM

## 2022-05-28 MED ORDER — REGADENOSON 0.4 MG/5ML IV SOLN
0.4000 mg | Freq: Once | INTRAVENOUS | Status: AC
  Administered 2022-05-28: 0.4 mg via INTRAVENOUS

## 2022-05-28 MED ORDER — TECHNETIUM TC 99M TETROFOSMIN IV KIT
10.2000 | PACK | Freq: Once | INTRAVENOUS | Status: AC | PRN
  Administered 2022-05-28: 10.2 via INTRAVENOUS

## 2022-05-28 MED ORDER — TECHNETIUM TC 99M TETROFOSMIN IV KIT
31.8000 | PACK | Freq: Once | INTRAVENOUS | Status: AC | PRN
  Administered 2022-05-28: 31.8 via INTRAVENOUS

## 2022-05-29 LAB — MYOCARDIAL PERFUSION IMAGING
LV dias vol: 86 mL (ref 46–106)
LV sys vol: 36 mL
Nuc Stress EF: 58 %
Peak HR: 86 {beats}/min
Rest HR: 72 {beats}/min
Rest Nuclear Isotope Dose: 10.2 mCi
SDS: 2
SRS: 0
SSS: 2
ST Depression (mm): 0 mm
Stress Nuclear Isotope Dose: 31.8 mCi
TID: 1.12

## 2022-08-02 IMAGING — MG MM DIGITAL SCREENING BILAT W/ TOMO AND CAD
6 of 10 series · 6 of 30 positions shown · non-contrast
Comparison: Previous exam(s).

CLINICAL DATA: Screening.

EXAM:
DIGITAL SCREENING BILATERAL MAMMOGRAM WITH TOMOSYNTHESIS AND CAD
TECHNIQUE: Bilateral screening digital craniocaudal and mediolateral oblique
mammograms were obtained. Bilateral screening digital breast
tomosynthesis was performed. The images were evaluated with
computer-aided detection.

[R MLO synth-2D (1 of 2)]
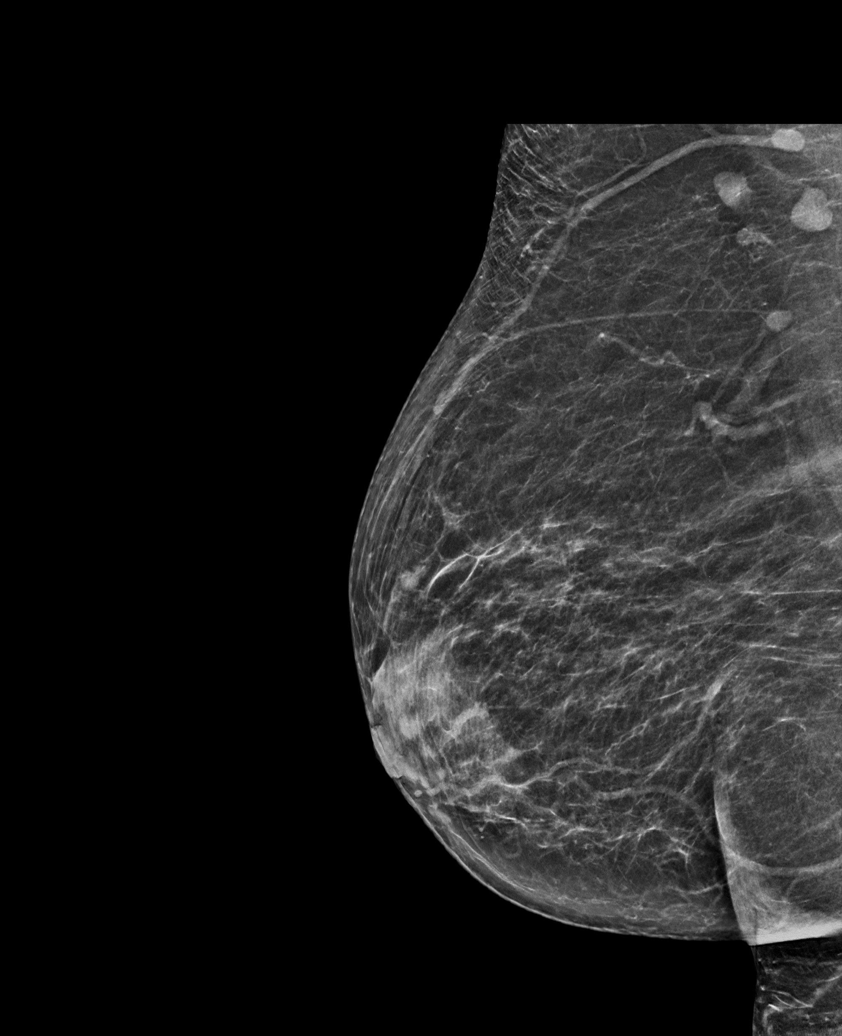

[R MLO synth-2D (2 of 2)]
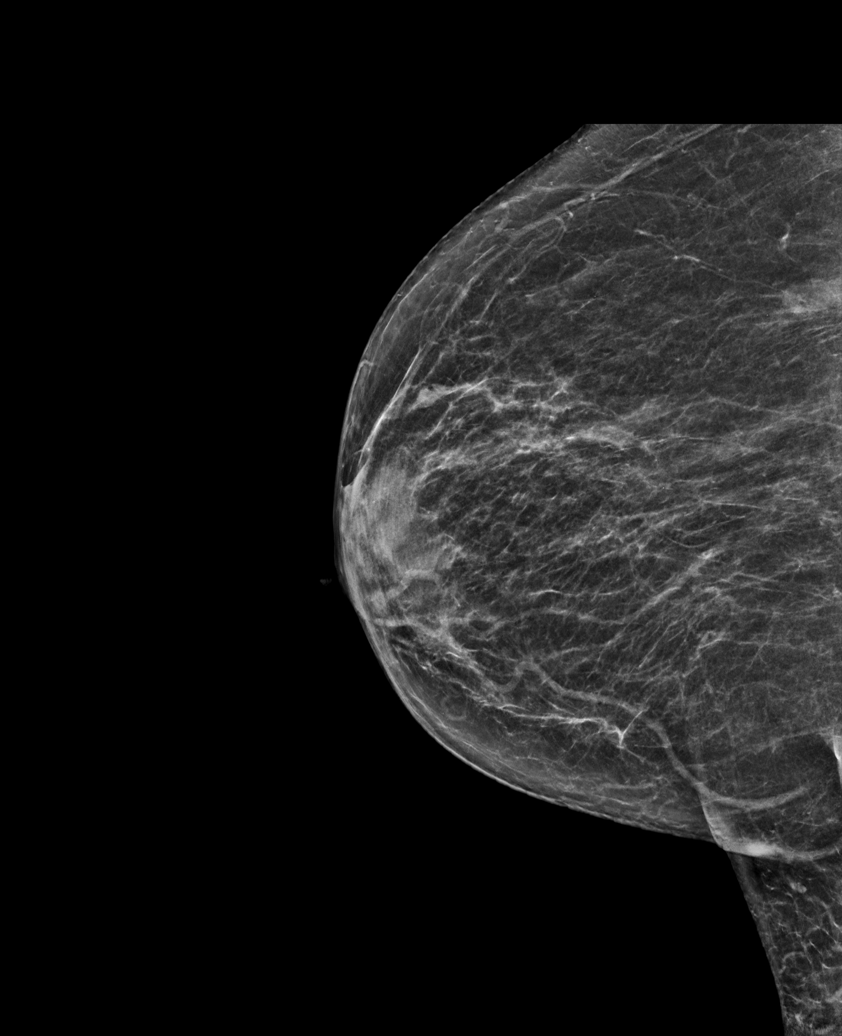

[L CC synth-2D]
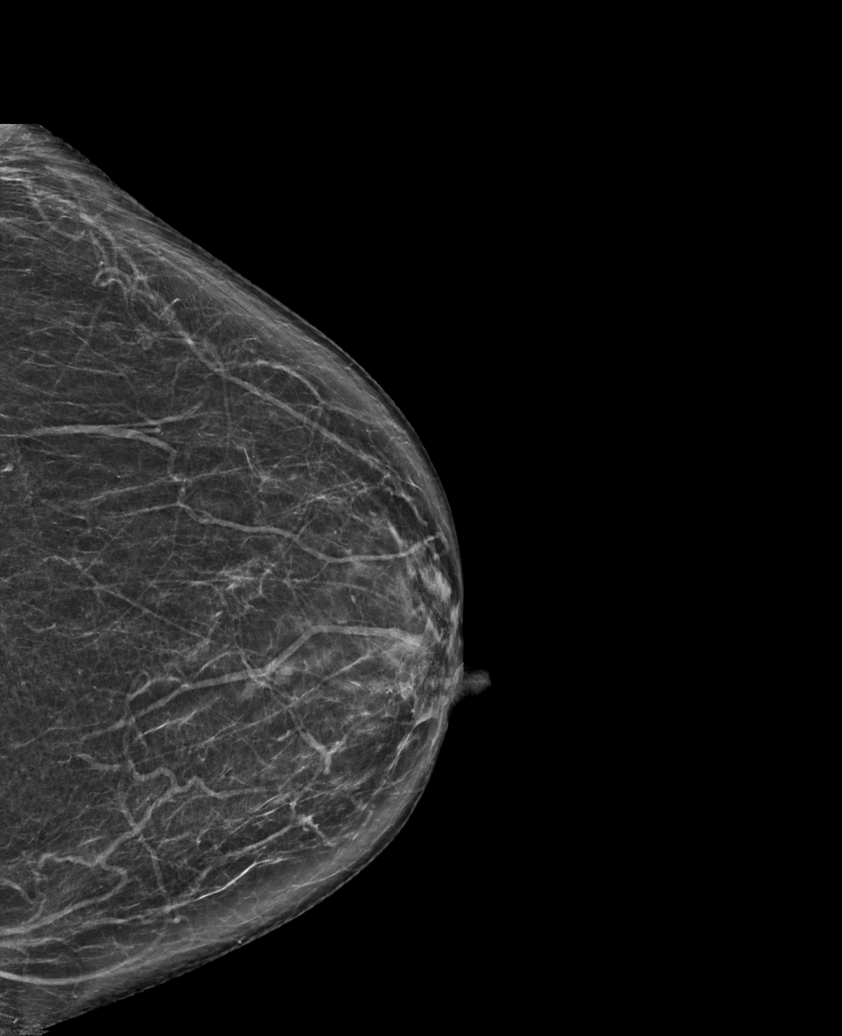

[L MLO synth-2D]
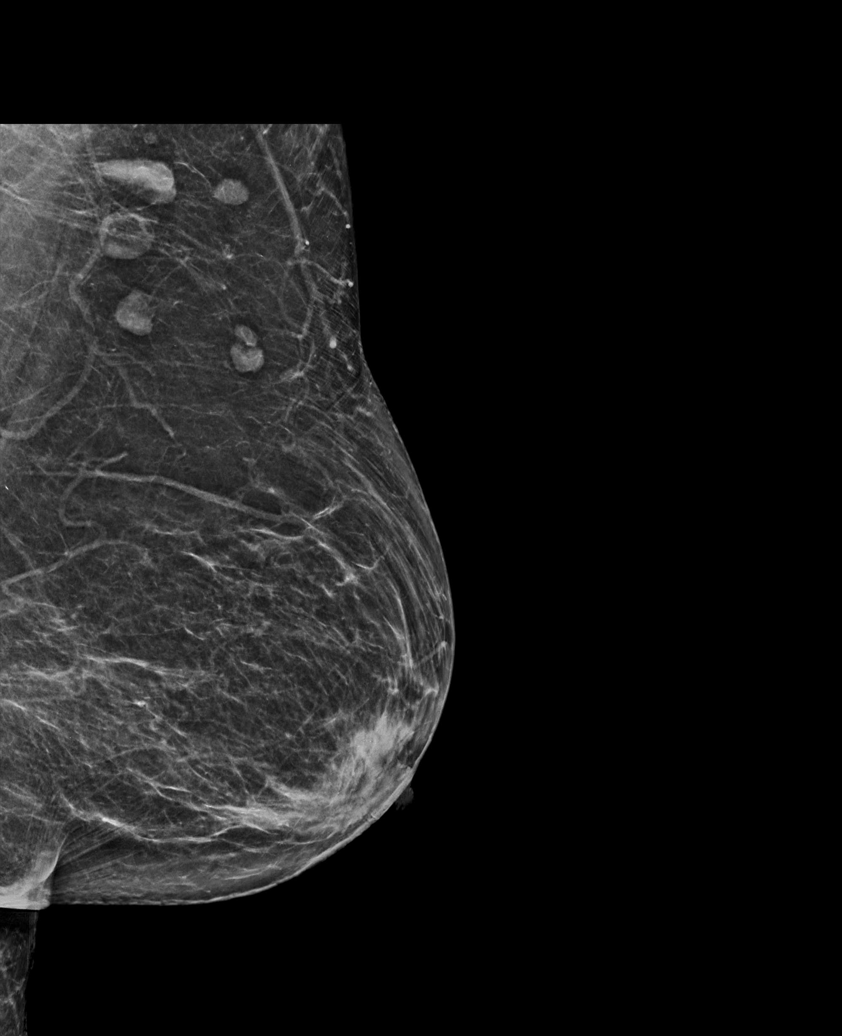

[R CC synth-2D]
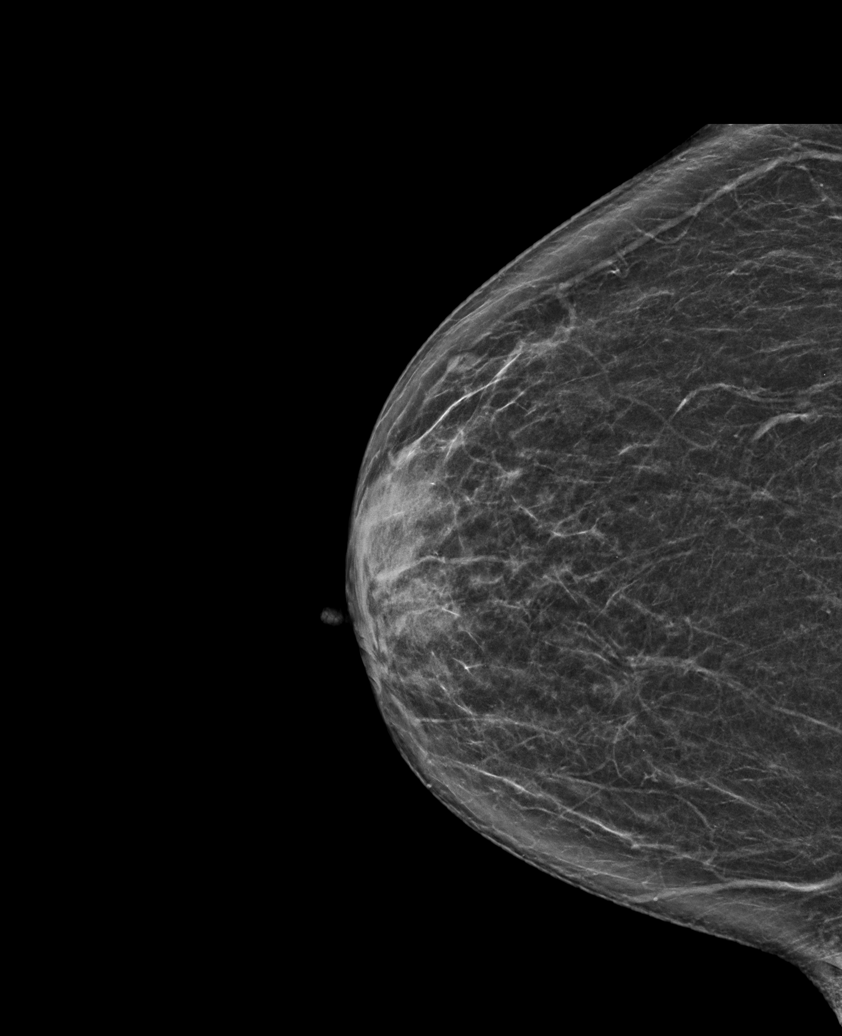

[R MLO tomo · tomo slice 33/64.0]
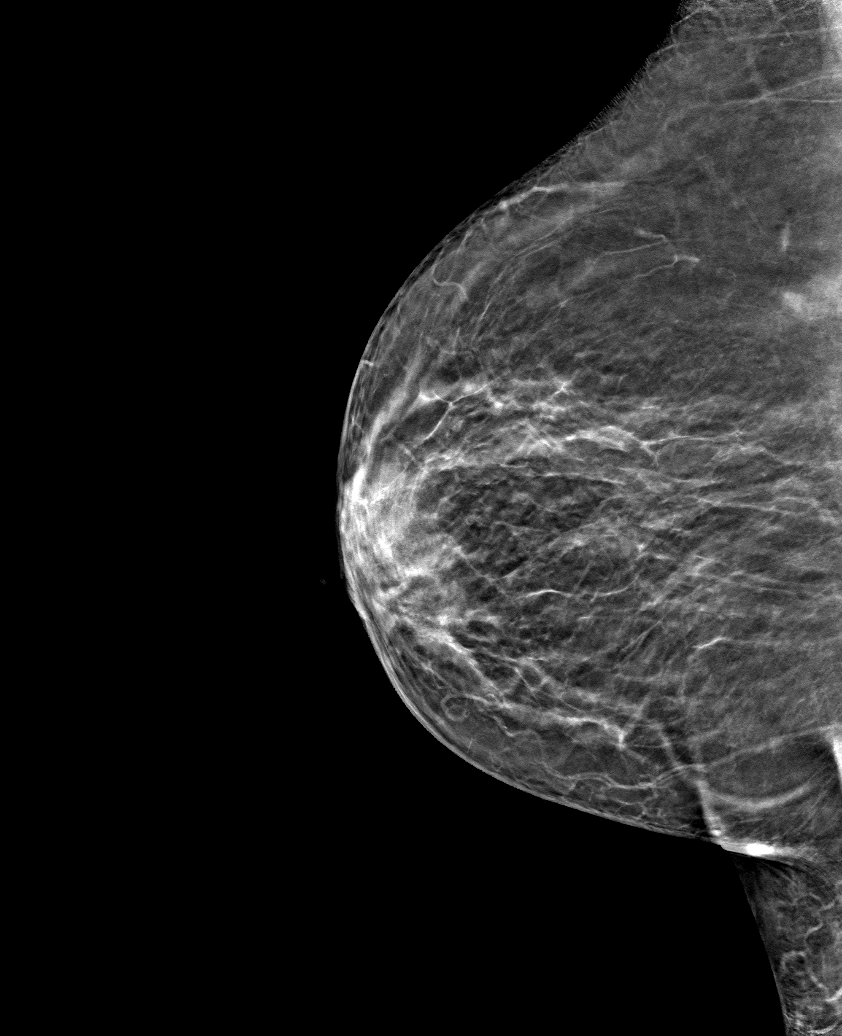

[6 of 30 positions shown; findings below may reference images not displayed]

ACR Breast Density Category b: There are scattered areas of
fibroglandular density.
FINDINGS: There are no findings suspicious for malignancy.
IMPRESSION: No mammographic evidence of malignancy. A result letter of this
screening mammogram will be mailed directly to the patient.

RECOMMENDATION:
Screening mammogram in one year. (Code:51-O-LD2)

BI-RADS CATEGORY  1: Negative.

## 2022-08-21 DIAGNOSIS — H5010 Unspecified exotropia: Secondary | ICD-10-CM | POA: Diagnosis not present

## 2022-08-21 DIAGNOSIS — H25811 Combined forms of age-related cataract, right eye: Secondary | ICD-10-CM | POA: Diagnosis not present
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
## 2022-08-29 ENCOUNTER — Other Ambulatory Visit: Payer: Self-pay

## 2022-08-29 MED ORDER — IRBESARTAN-HYDROCHLOROTHIAZIDE 300-12.5 MG PO TABS: 1.0000 | ORAL_TABLET | Freq: Every day | ORAL | 3 refills | Status: DC

## 2022-09-11 ENCOUNTER — Ambulatory Visit: Payer: Medicare PPO | Admitting: Obstetrics and Gynecology

## 2022-09-11 ENCOUNTER — Encounter: Payer: Self-pay | Admitting: Obstetrics and Gynecology

## 2022-09-11 VITALS — BP 122/80 | Ht 62.0 in | Wt 232.0 lb

## 2022-09-11 DIAGNOSIS — Z4689 Encounter for fitting and adjustment of other specified devices: Secondary | ICD-10-CM | POA: Diagnosis not present

## 2022-09-11 DIAGNOSIS — N813 Complete uterovaginal prolapse: Secondary | ICD-10-CM

## 2022-09-11 NOTE — Patient Instructions (Signed)
Vibegron Tablets Logan Bores) What is this medication? VIBEGRON (vye BEG ron) treats symptoms of an overactive bladder, such as loss of bladder control or frequent need to urinate. It works by relaxing muscles in the bladder. It belongs to a group of medications called antispasmodics. This medicine may be used for other purposes; ask your health care provider or pharmacist if you have questions. COMMON BRAND NAME(S): GEMTESA What should I tell my care team before I take this medication? They need to know if you have any of these conditions: Kidney disease Liver disease Prostate disease Trouble passing urine An unusual or allergic reaction to vibegron, other medications, foods, dyes, or preservatives Pregnant or trying to get pregnant Breast-feeding How should I use this medication? Take this medication by mouth with water. Take it as directed on the prescription label at the same time every day. Swallow the tablets whole. You may crush the tablet and mix with 1 tablespoon (15 mL) of applesauce. Swallow the medication and applesauce right away. Keep taking it unless your care team tells you to stop. You can take it with or without food. If it upsets your stomach, take it with food. Talk to your care team about the use of this medication in children. Special care may be needed. Overdosage: If you think you have taken too much of this medicine contact a poison control center or emergency room at once. NOTE: This medicine is only for you. Do not share this medicine with others. What if I miss a dose? If you miss a dose, take it as soon as you can. If it is almost time for your next dose, take only that dose. Do not take double or extra doses. What may interact with this medication? This medication may interact with the following: Digoxin This list may not describe all possible interactions. Give your health care provider a list of all the medicines, herbs, non-prescription drugs, or dietary  supplements you use. Also tell them if you smoke, drink alcohol, or use illegal drugs. Some items may interact with your medicine. What should I watch for while using this medication? Visit your care team for regular checks on your progress. Tell your care team if your symptoms do not start to get better or if they get worse. You may need to limit your intake of tea, coffee, caffeinated drinks, or alcohol. These drinks may make your symptoms worse. What side effects may I notice from receiving this medication? Side effects that you should report to your care team as soon as possible: Allergic reactions--skin rash, itching, hives, swelling of the face, lips, tongue, or throat Trouble passing urine Side effects that usually do not require medical attention (report to your care team if they continue or are bothersome): Diarrhea Headache Nausea Runny or stuffy nose Sore throat This list may not describe all possible side effects. Call your doctor for medical advice about side effects. You may report side effects to FDA at 1-800-FDA-1088. Where should I keep my medication? Keep out of the reach of children and pets. Store at room temperature between 20 and 25 degrees C (68 and 77 degrees F). Get rid of any unused medication after the expiration date. To get rid of medications that are no longer needed or have expired: Take the medication to a medication take-back program. Check with your pharmacy or law enforcement to find a location. If you cannot return the medication, check the label or package insert to see if the medication should be thrown out  in the garbage or flushed down the toilet. If you are not sure, ask your care team. If it is safe to put it in the trash, empty the medication out of the container. Mix the medication with cat litter, dirt, coffee grounds, or other unwanted substance. Seal the mixture in a bag or container. Put it in the trash. NOTE: This sheet is a summary. It may not  cover all possible information. If you have questions about this medicine, talk to your doctor, pharmacist, or health care provider.  2023 Elsevier/Gold Standard (2021-06-25 00:00:00)

## 2022-09-26 DIAGNOSIS — H268 Other specified cataract: Secondary | ICD-10-CM | POA: Diagnosis not present

## 2022-09-26 DIAGNOSIS — H25011 Cortical age-related cataract, right eye: Secondary | ICD-10-CM | POA: Diagnosis not present

## 2022-10-06 DIAGNOSIS — H2512 Age-related nuclear cataract, left eye: Secondary | ICD-10-CM | POA: Diagnosis not present

## 2022-10-10 DIAGNOSIS — H25012 Cortical age-related cataract, left eye: Secondary | ICD-10-CM | POA: Diagnosis not present

## 2022-10-10 DIAGNOSIS — H268 Other specified cataract: Secondary | ICD-10-CM | POA: Diagnosis not present

## 2022-10-11 ENCOUNTER — Ambulatory Visit: Payer: Medicare PPO | Admitting: Internal Medicine

## 2022-10-15 ENCOUNTER — Ambulatory Visit: Payer: Medicare PPO | Admitting: Internal Medicine

## 2022-10-15 ENCOUNTER — Encounter: Payer: Self-pay | Admitting: Internal Medicine

## 2022-10-15 VITALS — BP 124/78 | HR 77 | Temp 98.7°F | Ht 62.0 in | Wt 239.0 lb

## 2022-10-15 DIAGNOSIS — R2689 Other abnormalities of gait and mobility: Secondary | ICD-10-CM

## 2022-10-15 DIAGNOSIS — R739 Hyperglycemia, unspecified: Secondary | ICD-10-CM

## 2022-10-15 DIAGNOSIS — Z0001 Encounter for general adult medical examination with abnormal findings: Secondary | ICD-10-CM

## 2022-10-15 DIAGNOSIS — E7800 Pure hypercholesterolemia, unspecified: Secondary | ICD-10-CM

## 2022-10-15 DIAGNOSIS — R350 Frequency of micturition: Secondary | ICD-10-CM

## 2022-10-15 DIAGNOSIS — E559 Vitamin D deficiency, unspecified: Secondary | ICD-10-CM | POA: Diagnosis not present

## 2022-10-15 DIAGNOSIS — G5603 Carpal tunnel syndrome, bilateral upper limbs: Secondary | ICD-10-CM | POA: Diagnosis not present

## 2022-10-15 DIAGNOSIS — J309 Allergic rhinitis, unspecified: Secondary | ICD-10-CM

## 2022-10-15 DIAGNOSIS — R269 Unspecified abnormalities of gait and mobility: Secondary | ICD-10-CM | POA: Diagnosis not present

## 2022-10-15 DIAGNOSIS — E538 Deficiency of other specified B group vitamins: Secondary | ICD-10-CM | POA: Diagnosis not present

## 2022-10-15 LAB — LIPID PANEL
Cholesterol: 129 mg/dL (ref 0–200)
HDL: 56.8 mg/dL (ref >39.00)
LDL Cholesterol: 59 mg/dL (ref 0–99)
NonHDL: 72.25
Total CHOL/HDL Ratio: 2
Triglycerides: 66 mg/dL (ref 0.0–149.0)
VLDL: 13.2 mg/dL (ref 0.0–40.0)

## 2022-10-15 LAB — BASIC METABOLIC PANEL
BUN: 20 mg/dL (ref 6–23)
CO2: 30 mEq/L (ref 19–32)
Calcium: 9.6 mg/dL (ref 8.4–10.5)
Chloride: 98 mEq/L (ref 96–112)
Creatinine, Ser: 0.96 mg/dL (ref 0.40–1.20)
GFR: 58.73 mL/min — ABNORMAL LOW (ref >60.00)
Glucose, Bld: 87 mg/dL (ref 70–99)
Potassium: 3.9 mEq/L (ref 3.5–5.1)
Sodium: 138 mEq/L (ref 135–145)

## 2022-10-15 LAB — URINALYSIS, ROUTINE W REFLEX MICROSCOPIC
Bilirubin Urine: NEGATIVE
Hgb urine dipstick: NEGATIVE
Ketones, ur: NEGATIVE
Nitrite: NEGATIVE
RBC / HPF: NONE SEEN (ref 0–?)
Specific Gravity, Urine: 1.01 (ref 1.000–1.030)
Total Protein, Urine: NEGATIVE
Urine Glucose: NEGATIVE
Urobilinogen, UA: 0.2 (ref 0.0–1.0)
pH: 6 (ref 5.0–8.0)

## 2022-10-15 LAB — CBC WITH DIFFERENTIAL/PLATELET
Basophils Absolute: 0 10*3/uL (ref 0.0–0.1)
Basophils Relative: 0.5 % (ref 0.0–3.0)
Eosinophils Absolute: 0.1 10*3/uL (ref 0.0–0.7)
Eosinophils Relative: 1.9 % (ref 0.0–5.0)
HCT: 40.3 % (ref 36.0–46.0)
Hemoglobin: 13.4 g/dL (ref 12.0–15.0)
Lymphocytes Relative: 28.8 % (ref 12.0–46.0)
Lymphs Abs: 2 10*3/uL (ref 0.7–4.0)
MCHC: 33.3 g/dL (ref 30.0–36.0)
MCV: 90.3 fl (ref 78.0–100.0)
Monocytes Absolute: 0.6 10*3/uL (ref 0.1–1.0)
Monocytes Relative: 8.3 % (ref 3.0–12.0)
Neutro Abs: 4.1 10*3/uL (ref 1.4–7.7)
Neutrophils Relative %: 60.5 % (ref 43.0–77.0)
Platelets: 231 10*3/uL (ref 150.0–400.0)
RBC: 4.46 Mil/uL (ref 3.87–5.11)
RDW: 13.2 % (ref 11.5–15.5)
WBC: 6.8 10*3/uL (ref 4.0–10.5)

## 2022-10-15 LAB — HEPATIC FUNCTION PANEL
ALT: 15 U/L (ref 0–35)
AST: 18 U/L (ref 0–37)
Albumin: 4.1 g/dL (ref 3.5–5.2)
Alkaline Phosphatase: 79 U/L (ref 39–117)
Bilirubin, Direct: 0.3 mg/dL (ref 0.0–0.3)
Total Bilirubin: 1.7 mg/dL — ABNORMAL HIGH (ref 0.2–1.2)
Total Protein: 8.4 g/dL — ABNORMAL HIGH (ref 6.0–8.3)

## 2022-10-15 LAB — VITAMIN B12: Vitamin B-12: 382 pg/mL (ref 211–911)

## 2022-10-15 LAB — HEMOGLOBIN A1C: Hgb A1c MFr Bld: 5.2 % (ref 4.6–6.5)

## 2022-10-15 LAB — TSH: TSH: 2.83 u[IU]/mL (ref 0.35–5.50)

## 2022-10-15 LAB — VITAMIN D 25 HYDROXY (VIT D DEFICIENCY, FRACTURES): VITD: 33.83 ng/mL (ref 30.00–100.00)

## 2022-10-15 MED ORDER — GEMTESA 75 MG PO TABS: 75.0000 mg | ORAL_TABLET | Freq: Every day | ORAL | 3 refills | Status: DC

## 2022-10-15 NOTE — Patient Instructions (Addendum)
Please take all new medication as prescribed - the Gemtessa 75 mg per day  Ok to try the OTC Nasacort for allergies as well  Please take OTC Vitamin D3 at 2000 units per day, indefinitely, or 4000 units if you already take the 2000 units  Please continue all other medications as before, and refills have been done if requested.  Please have the pharmacy call with any other refills you may need.  Please continue your efforts at being more active, low cholesterol diet, and weight control.  You are otherwise up to date with prevention measures today.  Please keep your appointments with your specialists as you may have planned - GYN every 3 mo  You will be contacted regarding the referral for: Physical Therapy, and Hand Surgury  Please go to the LAB at the blood drawing area for the tests to be done  You will be contacted by phone if any changes need to be made immediately.  Otherwise, you will receive a letter about your results with an explanation, but please check with MyChart first.  Please remember to sign up for MyChart if you have not done so, as this will be important to you in the future with finding out test results, communicating by private email, and scheduling acute appointments online when needed.  Please make an Appointment to return in 6 months, or sooner if needed
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
## 2022-10-15 NOTE — Progress Notes (Signed)
The test results show that your current treatment is OK, as the tests are stable.  Please continue the same plan.  There is no other need for change of treatment or further evaluation based on these results, at this time.  thanks 

## 2022-10-17 ENCOUNTER — Ambulatory Visit (INDEPENDENT_AMBULATORY_CARE_PROVIDER_SITE_OTHER): Payer: Medicare PPO | Admitting: Orthopaedic Surgery

## 2022-10-17 DIAGNOSIS — M79642 Pain in left hand: Secondary | ICD-10-CM

## 2022-10-17 DIAGNOSIS — M79641 Pain in right hand: Secondary | ICD-10-CM | POA: Diagnosis not present

## 2022-10-17 DIAGNOSIS — R2689 Other abnormalities of gait and mobility: Secondary | ICD-10-CM | POA: Insufficient documentation

## 2022-10-17 DIAGNOSIS — R269 Unspecified abnormalities of gait and mobility: Secondary | ICD-10-CM | POA: Insufficient documentation

## 2022-10-17 DIAGNOSIS — J309 Allergic rhinitis, unspecified: Secondary | ICD-10-CM | POA: Insufficient documentation

## 2022-10-17 NOTE — Assessment & Plan Note (Signed)
With worsening symptoms despite wrist splint use at night, now for hand surgury referral

## 2022-10-17 NOTE — Assessment & Plan Note (Signed)
For contd cane use
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
## 2022-10-17 NOTE — Assessment & Plan Note (Signed)
Lab Results  Component Value Date   HGBA1C 5.2 10/15/2022   Stable, pt to continue current medical treatment norvasc 5 qd, avalide 300 - 12.5 mg qd

## 2022-10-17 NOTE — Assessment & Plan Note (Signed)
Age and sex appropriate education and counseling updated with regular exercise and diet Referrals for preventative services - none needed Immunizations addressed - declines tdap and covid booster, but for shingrix at pharmacy Smoking counseling  - none needed Evidence for depression or other mood disorder - none significant Most recent labs reviewed. I have personally reviewed and have noted: 1) the patient's medical and social history 2) The patient's current medications and supplements 3) The patient's height, weight, and BMI have been recorded in the chart

## 2022-10-17 NOTE — Assessment & Plan Note (Signed)
Last vitamin D Lab Results  Component Value Date   VD25OH 33.83 10/15/2022   Low, to start oral replacement

## 2022-10-17 NOTE — Assessment & Plan Note (Signed)
Lab Results  Component Value Date   LDLCALC 59 10/15/2022   Stable, pt to continue current statin crestor 10 mg qd

## 2022-10-17 NOTE — Assessment & Plan Note (Signed)
C/w OAB - has been intolerant of severeral meds, now for [facility]

## 2022-10-17 NOTE — Assessment & Plan Note (Signed)
Worsening, for PT eval and treat

## 2022-10-17 NOTE — Assessment & Plan Note (Signed)
Mild to mod, for add nasacort otc asd,  to f/u any worsening symptoms or concerns

## 2022-10-21 ENCOUNTER — Other Ambulatory Visit: Payer: Self-pay

## 2022-10-21 MED ORDER — AMLODIPINE BESYLATE 5 MG PO TABS: ORAL_TABLET | ORAL | 3 refills | Status: DC

## 2022-10-21 MED ORDER — POTASSIUM CHLORIDE ER 10 MEQ PO TBCR: EXTENDED_RELEASE_TABLET | ORAL | 3 refills | Status: DC

## 2022-10-22 ENCOUNTER — Ambulatory Visit: Payer: Medicare PPO | Admitting: Physical Therapy

## 2022-10-31 ENCOUNTER — Ambulatory Visit: Payer: Medicare PPO | Attending: Internal Medicine | Admitting: Physical Therapy

## 2022-10-31 ENCOUNTER — Encounter: Payer: Self-pay | Admitting: Physical Therapy

## 2022-10-31 DIAGNOSIS — R269 Unspecified abnormalities of gait and mobility: Secondary | ICD-10-CM | POA: Insufficient documentation

## 2022-10-31 DIAGNOSIS — R2681 Unsteadiness on feet: Secondary | ICD-10-CM | POA: Diagnosis not present

## 2022-10-31 DIAGNOSIS — R2689 Other abnormalities of gait and mobility: Secondary | ICD-10-CM | POA: Insufficient documentation

## 2022-10-31 DIAGNOSIS — M6281 Muscle weakness (generalized): Secondary | ICD-10-CM | POA: Insufficient documentation
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
## 2022-11-04 ENCOUNTER — Ambulatory Visit: Payer: Medicare PPO | Admitting: Physical Therapy

## 2022-11-04 DIAGNOSIS — R2689 Other abnormalities of gait and mobility: Secondary | ICD-10-CM | POA: Diagnosis not present

## 2022-11-04 DIAGNOSIS — M6281 Muscle weakness (generalized): Secondary | ICD-10-CM

## 2022-11-04 DIAGNOSIS — R2681 Unsteadiness on feet: Secondary | ICD-10-CM

## 2022-11-04 DIAGNOSIS — R269 Unspecified abnormalities of gait and mobility: Secondary | ICD-10-CM | POA: Diagnosis not present
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
## 2022-11-05 ENCOUNTER — Encounter: Payer: Self-pay | Admitting: Physical Therapy

## 2022-11-08 ENCOUNTER — Other Ambulatory Visit (HOSPITAL_COMMUNITY): Payer: Self-pay

## 2022-11-08 ENCOUNTER — Ambulatory Visit: Payer: Medicare PPO | Admitting: Physical Medicine and Rehabilitation

## 2022-11-08 ENCOUNTER — Telehealth: Payer: Self-pay | Admitting: Internal Medicine

## 2022-11-08 DIAGNOSIS — M79641 Pain in right hand: Secondary | ICD-10-CM | POA: Diagnosis not present

## 2022-11-08 DIAGNOSIS — M25521 Pain in right elbow: Secondary | ICD-10-CM | POA: Diagnosis not present

## 2022-11-08 DIAGNOSIS — R531 Weakness: Secondary | ICD-10-CM | POA: Diagnosis not present

## 2022-11-08 DIAGNOSIS — M542 Cervicalgia: Secondary | ICD-10-CM | POA: Diagnosis not present

## 2022-11-08 DIAGNOSIS — R202 Paresthesia of skin: Secondary | ICD-10-CM

## 2022-11-08 DIAGNOSIS — M79642 Pain in left hand: Secondary | ICD-10-CM

## 2022-11-08 NOTE — Progress Notes (Signed)
Functional Pain Scale - descriptive words and definitions  Moderate (4)   Constantly aware of pain, can complete ADLs with modification/sleep marginally affected at times/passive distraction is of no use, but active distraction gives some relief. Moderate range order  Average Pain 4-5  Right handed. Numbness and pain in all fingers. Hard to grasp things

## 2022-11-08 NOTE — Telephone Encounter (Signed)
Pt called stating the medication needs approval.   furosemide (LASIX) 20 MG tablet

## 2022-11-08 NOTE — Progress Notes (Signed)
Veronica [name] - 72 y.o. female MRN [id number]  Date of birth: [birth year]  Office Visit Note: Visit Date: 11/08/2022 PCP: Corwin Levins, MD Referred by: Tarry Kos, MD  Subjective: Chief Complaint  Patient presents with   Right Hand - Pain, Numbness   Left Hand - Pain, Numbness   HPI:  Veronica [name] is a 72 y.o. female who comes in today at the request of Dr. Glee Arvin for evaluation and management of chronic, worsening and severe pain, numbness and tingling in the Bilateral upper extremities.  Patient is Right hand dominant.  She is a very pleasant but somewhat complicated clinical patient in terms of symptoms and current problems.  She has a problem list that includes prior history of carpal tunnel syndrome and peripheral neuropathy and cervical radiculopathy.  She has had no cervical surgery or recent imaging.  She reports having had a prior electrodiagnostic study many years ago but cannot really remember the details.  From the looks of the history in the chart this may have been around 2012.  In terms of the peripheral neuropathy there is nothing listed as to why she would have that.  She is not diabetic.  She has had routine hemoglobin A1c's performed which have been normal.  She did have a history of some hyperglycemia but no real diagnosis of diabetes.  She comes in today with bilateral right slightly more than left numbness and weakness in both hands globally in all the digits.  She has some referral up into the elbows bilaterally.  She has weakness and trouble grasping objects.  She is currently in physical therapy for generalized weakness.  She has been wearing braces at night but is only needed.  No prior carpal tunnel release.  Rates her pain and function scale is a 4 out of 10.   I spent more than 30 minutes speaking face-to-face with the patient with 50% of the time in counseling and discussing coordination of care.    Review of Systems  Musculoskeletal:  Positive for  back pain, joint pain and neck pain.  Neurological:  Positive for tingling and weakness.  All other systems reviewed and are negative.  Otherwise per HPI.  Assessment & Plan: Visit Diagnoses:    ICD-10-CM   1. Paresthesia of skin  R20.2 NCV with EMG (electromyography)    2. Bilateral hand pain  M79.641    M79.642     3. Weakness  R53.1     4. Pain in right elbow  M25.521     5. 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 The lesion is characterized by sensory and motor demyelination with evidence of significant axonal injury.   There is no significant electrodiagnostic evidence of any other focal nerve entrapment, brachial plexopathy or cervical radiculopathy.  This test would not exclude a peripheral polyneuropathy but the electrodiagnostic study is more consistent with focal nerve compression.  Recommendations: 1.  Follow-up with referring physician. 2.  Continue current management of symptoms. 3.  Suggest surgical evaluation.  Meds & Orders: No orders of the defined types were placed in this encounter.   Orders Placed This Encounter  Procedures   NCV with EMG (electromyography)  Follow-up: Return for  Glee Arvin, MD.   Procedures: No procedures performed  EMG & NCV Findings: Evaluation of the left median motor nerve showed prolonged distal onset latency (4.8 ms), reduced amplitude (3.6 mV), and decreased conduction velocity (Elbow-Wrist, 46 m/s).  The right median motor nerve showed prolonged distal onset latency (5.5 ms) and decreased conduction velocity (Elbow-Wrist, 48 m/s).  The left radial motor nerve showed prolonged distal onset latency (3.5 ms).  The  right ulnar motor nerve showed prolonged distal onset latency (4.5 ms), reduced amplitude (0.3 mV), decreased conduction velocity (B Elbow-Wrist, 44 m/s), and decreased conduction velocity (A Elbow-B Elbow, 22 m/s).  The left median (across palm) sensory nerve showed no response (Palm) and prolonged distal peak latency (4.7 ms).  The right median (across palm) sensory nerve showed no response (Palm), prolonged distal peak latency (5.8 ms), and reduced amplitude (7.7 V).  The left ulnar sensory and the right ulnar sensory nerves showed reduced amplitude (L8.7, R3.2 V).  All remaining nerves (as indicated in the following tables) were within normal limits.  All left vs. right side differences were within normal limits.    Needle evaluation of the right first dorsal interosseous muscle showed decreased insertional activity, widespread spontaneous activity, and diminished recruitment.  All remaining muscles (as indicated in the following table) showed no evidence of electrical instability.    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 The lesion is characterized by sensory and motor demyelination with evidence of significant axonal injury.   There is no significant electrodiagnostic evidence of any other focal nerve entrapment, brachial plexopathy or cervical radiculopathy.  This test would not exclude a peripheral polyneuropathy but the electrodiagnostic study is more consistent with focal nerve compression.  Recommendations: 1.  Follow-up with referring physician. 2.  Continue current management of symptoms. 3.  Suggest surgical evaluation.  ___________________________ Naaman Plummer FAAPMR Board Certified, American  Board of Physical Medicine and Rehabilitation    Nerve Conduction Studies Anti Sensory Summary Table   Stim Site NR Peak (ms) Norm Peak (ms) P-T Amp (V) Norm P-T Amp Site1 Site2 Delta-P (ms) Dist (cm) Vel (m/s) Norm Vel (m/s)  Left Median Acr Palm Anti Sensory (2nd Digit)  31.1C  Wrist    *4.7 <3.6 21.2 >10 Wrist Palm  0.0    Palm *NR  <2.0          Right Median Acr Palm Anti Sensory (2nd Digit)  31.5C  Wrist    *5.8 <3.6 *7.7 >10 Wrist Palm  0.0    Palm *NR  <2.0          Left Radial Anti Sensory (Base 1st Digit)  30.7C  Wrist    2.2 <3.1 28.2  Wrist Base 1st Digit 2.2 0.0    Right Radial Anti Sensory (Base 1st Digit)  31.4C  Wrist    2.2 <3.1 29.0  Wrist Base 1st Digit 2.2 0.0    Left Ulnar Anti Sensory (5th Digit)  31.2C  Wrist    3.7 <3.7 *8.7 >15.0 Wrist 5th Digit 3.7 14.0 38 >38  Right Ulnar Anti Sensory (5th Digit)  31.8C  Wrist    3.6 <3.7 *3.2 >15.0 Wrist 5th Digit 3.6 14.0 39 >38  B Elbow    12.2  8.6  B Elbow Wrist 8.6 0.0  >47   Motor Summary Table   Stim Site NR Onset (ms) Norm Onset (ms) O-P Amp (mV) Norm O-P Amp Site1 Site2 Delta-0 (ms) Dist (cm) Vel (m/s) Norm Vel (m/s)  Left Median Motor (Abd Poll Brev)  31C  Wrist    *4.8 <4.2 *3.6 >5 Elbow Wrist 5.0 23.0 *46 >50  Elbow    9.8  2.9         Right Median Motor (Abd Poll Brev)  31.3C  Wrist    *5.5 <4.2 7.8 >5 Elbow Wrist 4.8 23.0 *48 >50  Elbow    10.3  7.5         Left Radial Motor (Ext Indicis)  31C  8cm    *3.5 <2.5 5.9 >1.7 Up Arm 8cm 3.4 21.0 62 >60  Up Arm    6.9  5.2  Axilla Up Arm 1.5 10.0 67   Axilla    8.4  5.2         Right Ulnar Motor (Abd Dig Min)  31.5C  Wrist    *4.5 <4.2 *0.3 >3 B Elbow Wrist 4.8 21.0 *44 >53  B Elbow    9.3  0.4  A Elbow B Elbow 4.6 10.0 *22 >53  A Elbow    13.9  1.0          EMG   Side Muscle Nerve Root Ins Act Fibs Psw Amp Dur Poly Recrt Int Dennie Bible Comment  Right Abd Poll Brev Median C8-T1 Nml Nml Nml Nml Nml 0 Nml Nml   Right 1stDorInt Ulnar C8-T1 *Decr *4+ *4+  Nml Nml 0 *Reduced Nml pos MUAP  Right PronatorTeres Median C6-7 Nml Nml Nml Nml Nml 0 Nml Nml   Right Biceps Musculocut C5-6 Nml Nml Nml Nml Nml 0 Nml Nml   Right Deltoid Axillary C5-6 Nml Nml Nml Nml Nml 0 Nml Nml     Nerve Conduction Studies Anti Sensory Left/Right Comparison   Stim Site L Lat (ms) R Lat (ms) L-R Lat (ms) L Amp (V) R Amp (V) L-R Amp (%) Site1 Site2 L Vel (m/s) R Vel (m/s) L-R Vel (m/s)  Median Acr Palm Anti Sensory (2nd Digit)  31.1C  Wrist *4.7 *5.8 1.1 21.2 *7.7 63.7 Wrist Palm     Palm             Radial Anti Sensory (Base 1st Digit)  30.7C  Wrist 2.2 2.2 0.0 28.2 29.0 2.8 Wrist Base 1st Digit     Ulnar Anti Sensory (5th Digit)  31.2C  Wrist 3.7 3.6 0.1 *8.7 *3.2 63.2 Wrist 5th Digit 38 39 1   Motor Left/Right Comparison   Stim Site L Lat (ms) R Lat (ms) L-R Lat (ms) L Amp (mV) R Amp (mV) L-R Amp (%) Site1 Site2 L Vel (m/s) R Vel (m/s) L-R Vel (m/s)  Median Motor (Abd Poll Brev)  31C  Wrist *4.8 *5.5 0.7 *3.6 7.8 53.8 Elbow Wrist *46 *48 2  Elbow 9.8 10.3 0.5 2.9 7.5 61.3       Radial Motor (Ext Indicis)  31C  8cm *3.5   5.9   Up Arm 8cm 62    Up Arm 6.9   5.2   Axilla Up Arm 67    Axilla 8.4   5.2         Ulnar Motor (Abd Dig Min)  31.5C  Wrist  *4.5   *0.3  B Elbow Wrist  *44   B Elbow  9.3  0.4  A Elbow B Elbow  *22   A Elbow  13.9   1.0           Waveforms:                      Clinical History: No specialty comments available.     Objective:  VS:  HT:    WT:   BMI:     BP:   HR: bpm  TEMP: ( )  RESP:  Physical Exam Vitals and nursing note reviewed.  Constitutional:      General: She is not in acute distress.    Appearance: Normal appearance. She is well-developed. She is obese. She is not ill-appearing.  HENT:     Head: Normocephalic and atraumatic.  Eyes:     Conjunctiva/sclera: Conjunctivae normal.     Pupils: Pupils are equal, round, and reactive to light.  Cardiovascular:     Rate and Rhythm: Normal rate.      Pulses: Normal pulses.  Pulmonary:     Effort: Pulmonary effort is normal.  Musculoskeletal:        General: No swelling, tenderness or deformity.     Right lower leg: No edema.     Left lower leg: No edema.     Comments: Inspection reveals atrophy of the right FDI but no atrophy of the bilateral APB or left FDI or hand intrinsics. There is no swelling, color changes, allodynia or dystrophic changes. There is 5 out of 5 strength in the bilateral wrist extension and long finger flexion.  She has some weakness on the right with finger abduction.  There are dysesthesias really globally but she does seem to have impaired sensation to light touch in the ulnar distribution on the right more than left.. There is a positive Froment's test on the right.  There is a positive Phalen's test bilaterally. There is a negative Hoffmann's test bilaterally.  Skin:    General: Skin is warm and dry.     Findings: No erythema or rash.  Neurological:     General: No focal deficit present.     Mental Status: She is alert and oriented to person, place, and time.     Sensory: No sensory deficit.     Motor: No weakness or abnormal muscle tone.     Coordination: Coordination normal.     Gait: Gait normal.  Psychiatric:        Mood and Affect: Mood normal.        Behavior: Behavior normal.      Imaging: No results found.

## 2022-11-08 NOTE — Telephone Encounter (Signed)
Ran test claim, received paid claim. Per charts, last prescription was written on 10/20/22, so prescription is now expired.

## 2022-11-12 ENCOUNTER — Ambulatory Visit: Payer: Medicare PPO

## 2022-11-12 NOTE — Procedures (Signed)
EMG & NCV Findings: Evaluation of the left median motor nerve showed prolonged distal onset latency (4.8 ms), reduced amplitude (3.6 mV), and decreased conduction velocity (Elbow-Wrist, 46 m/s).  The right median motor nerve showed prolonged distal onset latency (5.5 ms) and decreased conduction velocity (Elbow-Wrist, 48 m/s).  The left radial motor nerve showed prolonged distal onset latency (3.5 ms).  The right ulnar motor nerve showed prolonged distal onset latency (4.5 ms), reduced amplitude (0.3 mV), decreased conduction velocity (B Elbow-Wrist, 44 m/s), and decreased conduction velocity (A Elbow-B Elbow, 22 m/s).  The left median (across palm) sensory nerve showed no response (Palm) and prolonged distal peak latency (4.7 ms).  The right median (across palm) sensory nerve showed no response (Palm), prolonged distal peak latency (5.8 ms), and reduced amplitude (7.7 V).  The left ulnar sensory and the right ulnar sensory nerves showed reduced amplitude (L8.7, R3.2 V).  All remaining nerves (as indicated in the following tables) were within normal limits.  All left vs. right side differences were within normal limits.    Needle evaluation of the right first dorsal interosseous muscle showed decreased insertional activity, widespread spontaneous activity, and diminished recruitment.  All remaining muscles (as indicated in the following table) showed no evidence of electrical instability.    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 The lesion is characterized by sensory and motor demyelination with evidence of significant axonal injury.   There is no significant electrodiagnostic evidence of any other focal nerve  entrapment, brachial plexopathy or cervical radiculopathy.  This test would not exclude a peripheral polyneuropathy but the electrodiagnostic study is more consistent with focal nerve compression.  Recommendations: 1.  Follow-up with referring physician. 2.  Continue current management of symptoms. 3.  Suggest surgical evaluation.  ___________________________ Naaman Plummer FAAPMR Board Certified, American Board of Physical Medicine and Rehabilitation    Nerve Conduction Studies Anti Sensory Summary Table   Stim Site NR Peak (ms) Norm Peak (ms) P-T Amp (V) Norm P-T Amp Site1 Site2 Delta-P (ms) Dist (cm) Vel (m/s) Norm Vel (m/s)  Left Median Acr Palm Anti Sensory (2nd Digit)  31.1C  Wrist    *4.7 <3.6 21.2 >10 Wrist Palm  0.0    Palm *NR  <2.0          Right Median Acr Palm Anti Sensory (2nd Digit)  31.5C  Wrist    *5.8 <3.6 *7.7 >10 Wrist Palm  0.0    Palm *NR  <2.0          Left Radial Anti Sensory (Base 1st Digit)  30.7C  Wrist    2.2 <3.1 28.2  Wrist Base 1st Digit 2.2 0.0    Right Radial Anti Sensory (Base 1st Digit)  31.4C  Wrist    2.2 <3.1 29.0  Wrist Base 1st Digit 2.2 0.0    Left Ulnar Anti Sensory (5th Digit)  31.2C  Wrist    3.7 <3.7 *8.7 >15.0 Wrist 5th Digit 3.7 14.0 38 >38  Right Ulnar Anti Sensory (5th Digit)  31.8C  Wrist    3.6 <3.7 *3.2 >15.0 Wrist 5th Digit 3.6 14.0 39 >38  B Elbow    12.2  8.6  B Elbow Wrist 8.6 0.0  >47   Motor Summary Table  Stim Site NR Onset (ms) Norm Onset (ms) O-P Amp (mV) Norm O-P Amp Site1 Site2 Delta-0 (ms) Dist (cm) Vel (m/s) Norm Vel (m/s)  Left Median Motor (Abd Poll Brev)  31C  Wrist    *4.8 <4.2 *3.6 >5 Elbow Wrist 5.0 23.0 *46 >50  Elbow    9.8  2.9         Right Median Motor (Abd Poll Brev)  31.3C  Wrist    *5.5 <4.2 7.8 >5 Elbow Wrist 4.8 23.0 *48 >50  Elbow    10.3  7.5         Left Radial Motor (Ext Indicis)  31C  8cm    *3.5 <2.5 5.9 >1.7 Up Arm 8cm 3.4 21.0 62 >60  Up Arm    6.9  5.2  Axilla Up Arm 1.5 10.0  67   Axilla    8.4  5.2         Right Ulnar Motor (Abd Dig Min)  31.5C  Wrist    *4.5 <4.2 *0.3 >3 B Elbow Wrist 4.8 21.0 *44 >53  B Elbow    9.3  0.4  A Elbow B Elbow 4.6 10.0 *22 >53  A Elbow    13.9  1.0          EMG   Side Muscle Nerve Root Ins Act Fibs Psw Amp Dur Poly Recrt Int Dennie Bible Comment  Right Abd Poll Brev Median C8-T1 Nml Nml Nml Nml Nml 0 Nml Nml   Right 1stDorInt Ulnar C8-T1 *Decr *4+ *4+ Nml Nml 0 *Reduced Nml pos MUAP  Right PronatorTeres Median C6-7 Nml Nml Nml Nml Nml 0 Nml Nml   Right Biceps Musculocut C5-6 Nml Nml Nml Nml Nml 0 Nml Nml   Right Deltoid Axillary C5-6 Nml Nml Nml Nml Nml 0 Nml Nml     Nerve Conduction Studies Anti Sensory Left/Right Comparison   Stim Site L Lat (ms) R Lat (ms) L-R Lat (ms) L Amp (V) R Amp (V) L-R Amp (%) Site1 Site2 L Vel (m/s) R Vel (m/s) L-R Vel (m/s)  Median Acr Palm Anti Sensory (2nd Digit)  31.1C  Wrist *4.7 *5.8 1.1 21.2 *7.7 63.7 Wrist Palm     Palm             Radial Anti Sensory (Base 1st Digit)  30.7C  Wrist 2.2 2.2 0.0 28.2 29.0 2.8 Wrist Base 1st Digit     Ulnar Anti Sensory (5th Digit)  31.2C  Wrist 3.7 3.6 0.1 *8.7 *3.2 63.2 Wrist 5th Digit 38 39 1   Motor Left/Right Comparison   Stim Site L Lat (ms) R Lat (ms) L-R Lat (ms) L Amp (mV) R Amp (mV) L-R Amp (%) Site1 Site2 L Vel (m/s) R Vel (m/s) L-R Vel (m/s)  Median Motor (Abd Poll Brev)  31C  Wrist *4.8 *5.5 0.7 *3.6 7.8 53.8 Elbow Wrist *46 *48 2  Elbow 9.8 10.3 0.5 2.9 7.5 61.3       Radial Motor (Ext Indicis)  31C  8cm *3.5   5.9   Up Arm 8cm 62    Up Arm 6.9   5.2   Axilla Up Arm 67    Axilla 8.4   5.2         Ulnar Motor (Abd Dig Min)  31.5C  Wrist  *4.5   *0.3  B Elbow Wrist  *44   B Elbow  9.3   0.4  A Elbow B Elbow  *22   A Elbow  13.9   1.0           Waveforms:

## 2022-11-13 ENCOUNTER — Telehealth: Payer: Self-pay

## 2022-11-13 ENCOUNTER — Ambulatory Visit (INDEPENDENT_AMBULATORY_CARE_PROVIDER_SITE_OTHER): Payer: Medicare PPO

## 2022-11-13 VITALS — Ht 62.0 in | Wt 239.0 lb

## 2022-11-13 DIAGNOSIS — Z0000 Encounter for general adult medical examination without abnormal findings: Secondary | ICD-10-CM | POA: Diagnosis not present

## 2022-11-13 MED ORDER — FUROSEMIDE 20 MG PO TABS: 20.0000 mg | ORAL_TABLET | Freq: Every day | ORAL | 3 refills | Status: DC | PRN
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
## 2022-11-13 NOTE — Telephone Encounter (Signed)
Patient inquiring why her rx for furosemide 20 mg was denied.  Patient stated that she has been suffering from swelling for over a week and she last saw Dr. Jonny Ruiz in April.  Please send rx to [facility]

## 2022-11-13 NOTE — Addendum Note (Signed)
Addended by: Corwin Levins on: 11/13/2022 04:54 PM   Modules accepted: Orders

## 2022-11-13 NOTE — Telephone Encounter (Signed)
Spoke with pharmacist at [facility].  They need a new rx sent in for refill on lasix because the original rx has expired.

## 2022-11-13 NOTE — Patient Instructions (Addendum)
Veronica [name] , Thank you for taking time to come for your Medicare Wellness Visit. I appreciate your ongoing commitment to your health goals. Please review the following plan we discussed and let me know if I can assist you in the future.   These are the goals we discussed:  Goals      My goal for 2024 is to start physical therapy and regain my balance/gait.        This is a list of the screening recommended for you and due dates:  Health Maintenance  Topic Date Due   DTaP/Tdap/Td vaccine (2 - Td or Tdap) 12/26/2020   COVID-19 Vaccine (7 - 2023-24 season) 05/14/2022   Zoster (Shingles) Vaccine (1 of 2) 09/21/2023*   Flu Shot  01/16/2023   Medicare Annual Wellness Visit  11/13/2023   Mammogram  11/17/2023   Colon Cancer Screening  09/07/2026   Pneumonia Vaccine  Completed   DEXA scan (bone density measurement)  Completed   Hepatitis C Screening  Completed   HPV Vaccine  Aged Out  *Topic was postponed. The date shown is not the original due date.    Advanced directives: No  Conditions/risks identified: Yes; Unsteady Gait  Next appointment: Follow up in one year for your annual wellness visit.   Preventive Care 23 Years and Older, Female Preventive care refers to lifestyle choices and visits with your health care provider that can promote health and wellness. What does preventive care include? A yearly physical exam. This is also called an annual well check. Dental exams once or twice a year. Routine eye exams. Ask your health care provider how often you should have your eyes checked. Personal lifestyle choices, including: Daily care of your teeth and gums. Regular physical activity. Eating a healthy diet. Avoiding tobacco and drug use. Limiting alcohol use. Practicing safe sex. Taking low-dose aspirin every day. Taking vitamin and mineral supplements as recommended by your health care provider. What happens during an annual well check? The services and screenings done  by your health care provider during your annual well check will depend on your age, overall health, lifestyle risk factors, and family history of disease. Counseling  Your health care provider may ask you questions about your: Alcohol use. Tobacco use. Drug use. Emotional well-being. Home and relationship well-being. Sexual activity. Eating habits. History of falls. Memory and ability to understand (cognition). Work and work Astronomer. Reproductive health. Screening  You may have the following tests or measurements: Height, weight, and BMI. Blood pressure. Lipid and cholesterol levels. These may be checked every 5 years, or more frequently if you are over 22 years old. Skin check. Lung cancer screening. You may have this screening every year starting at age 40 if you have a 30-pack-year history of smoking and currently smoke or have quit within the past 15 years. Fecal occult blood test (FOBT) of the stool. You may have this test every year starting at age 5. Flexible sigmoidoscopy or colonoscopy. You may have a sigmoidoscopy every 5 years or a colonoscopy every 10 years starting at age 46. Hepatitis C blood test. Hepatitis B blood test. Sexually transmitted disease (STD) testing. Diabetes screening. This is done by checking your blood sugar (glucose) after you have not eaten for a while (fasting). You may have this done every 1-3 years. Bone density scan. This is done to screen for osteoporosis. You may have this done starting at age 90. Mammogram. This may be done every 1-2 years. Talk to your health care  provider about how often you should have regular mammograms. Talk with your health care provider about your test results, treatment options, and if necessary, the need for more tests. Vaccines  Your health care provider may recommend certain vaccines, such as: Influenza vaccine. This is recommended every year. Tetanus, diphtheria, and acellular pertussis (Tdap, Td) vaccine. You  may need a Td booster every 10 years. Zoster vaccine. You may need this after age 26. Pneumococcal 13-valent conjugate (PCV13) vaccine. One dose is recommended after age 44. Pneumococcal polysaccharide (PPSV23) vaccine. One dose is recommended after age 63. Talk to your health care provider about which screenings and vaccines you need and how often you need them. This information is not intended to replace advice given to you by your health care provider. Make sure you discuss any questions you have with your health care provider. Document Released: 06/30/2015 Document Revised: 02/21/2016 Document Reviewed: 04/04/2015 Elsevier Interactive Patient Education  2017 [employer].  Fall Prevention in the Home Falls can cause injuries. They can happen to people of all ages. There are many things you can do to make your home safe and to help prevent falls. What can I do on the outside of my home? Regularly fix the edges of walkways and driveways and fix any cracks. Remove anything that might make you trip as you walk through a door, such as a raised step or threshold. Trim any bushes or trees on the path to your home. Use bright outdoor lighting. Clear any walking paths of anything that might make someone trip, such as rocks or tools. Regularly check to see if handrails are loose or broken. Make sure that both sides of any steps have handrails. Any raised decks and porches should have guardrails on the edges. Have any leaves, snow, or ice cleared regularly. Use sand or salt on walking paths during winter. Clean up any spills in your garage right away. This includes oil or grease spills. What can I do in the bathroom? Use night lights. Install grab bars by the toilet and in the tub and shower. Do not use towel bars as grab bars. Use non-skid mats or decals in the tub or shower. If you need to sit down in the shower, use a plastic, non-slip stool. Keep the floor dry. Clean up any water that spills  on the floor as soon as it happens. Remove soap buildup in the tub or shower regularly. Attach bath mats securely with double-sided non-slip rug tape. Do not have throw rugs and other things on the floor that can make you trip. What can I do in the bedroom? Use night lights. Make sure that you have a light by your bed that is easy to reach. Do not use any sheets or blankets that are too big for your bed. They should not hang down onto the floor. Have a firm chair that has side arms. You can use this for support while you get dressed. Do not have throw rugs and other things on the floor that can make you trip. What can I do in the kitchen? Clean up any spills right away. Avoid walking on wet floors. Keep items that you use a lot in easy-to-reach places. If you need to reach something above you, use a strong step stool that has a grab bar. Keep electrical cords out of the way. Do not use floor polish or wax that makes floors slippery. If you must use wax, use non-skid floor wax. Do not have throw rugs  and other things on the floor that can make you trip. What can I do with my stairs? Do not leave any items on the stairs. Make sure that there are handrails on both sides of the stairs and use them. Fix handrails that are broken or loose. Make sure that handrails are as long as the stairways. Check any carpeting to make sure that it is firmly attached to the stairs. Fix any carpet that is loose or worn. Avoid having throw rugs at the top or bottom of the stairs. If you do have throw rugs, attach them to the floor with carpet tape. Make sure that you have a light switch at the top of the stairs and the bottom of the stairs. If you do not have them, ask someone to add them for you. What else can I do to help prevent falls? Wear shoes that: Do not have high heels. Have rubber bottoms. Are comfortable and fit you well. Are closed at the toe. Do not wear sandals. If you use a stepladder: Make  sure that it is fully opened. Do not climb a closed stepladder. Make sure that both sides of the stepladder are locked into place. Ask someone to hold it for you, if possible. Clearly mark and make sure that you can see: Any grab bars or handrails. First and last steps. Where the edge of each step is. Use tools that help you move around (mobility aids) if they are needed. These include: Canes. Walkers. Scooters. Crutches. Turn on the lights when you go into a dark area. Replace any light bulbs as soon as they burn out. Set up your furniture so you have a clear path. Avoid moving your furniture around. If any of your floors are uneven, fix them. If there are any pets around you, be aware of where they are. Review your medicines with your doctor. Some medicines can make you feel dizzy. This can increase your chance of falling. Ask your doctor what other things that you can do to help prevent falls. This information is not intended to replace advice given to you by your health care provider. Make sure you discuss any questions you have with your health care provider. Document Released: 03/30/2009 Document Revised: 11/09/2015 Document Reviewed: 07/08/2014 Elsevier Interactive Patient Education  2017 [employer].

## 2022-11-13 NOTE — Telephone Encounter (Signed)
Lasix already sent to [facility] on may 5

## 2022-11-14 ENCOUNTER — Ambulatory Visit: Payer: Medicare PPO | Admitting: Physical Therapy

## 2022-11-14 DIAGNOSIS — M6281 Muscle weakness (generalized): Secondary | ICD-10-CM | POA: Diagnosis not present

## 2022-11-14 DIAGNOSIS — R2689 Other abnormalities of gait and mobility: Secondary | ICD-10-CM | POA: Diagnosis not present

## 2022-11-14 DIAGNOSIS — R269 Unspecified abnormalities of gait and mobility: Secondary | ICD-10-CM | POA: Diagnosis not present

## 2022-11-14 DIAGNOSIS — R2681 Unsteadiness on feet: Secondary | ICD-10-CM | POA: Diagnosis not present
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
## 2022-11-14 NOTE — Telephone Encounter (Signed)
Notified pt MD sent rx to [facility] 

## 2022-11-15 ENCOUNTER — Encounter: Payer: Self-pay | Admitting: Physical Therapy

## 2022-11-18 ENCOUNTER — Ambulatory Visit: Payer: Medicare PPO | Attending: Internal Medicine | Admitting: Physical Therapy

## 2022-11-18 DIAGNOSIS — M6281 Muscle weakness (generalized): Secondary | ICD-10-CM | POA: Diagnosis not present

## 2022-11-18 DIAGNOSIS — R2689 Other abnormalities of gait and mobility: Secondary | ICD-10-CM | POA: Diagnosis not present

## 2022-11-18 DIAGNOSIS — R2681 Unsteadiness on feet: Secondary | ICD-10-CM | POA: Diagnosis not present
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
## 2022-11-19 ENCOUNTER — Encounter: Payer: Self-pay | Admitting: Physical Therapy

## 2022-11-21 ENCOUNTER — Ambulatory Visit: Payer: Medicare PPO | Admitting: Physical Therapy

## 2022-11-21 DIAGNOSIS — R2689 Other abnormalities of gait and mobility: Secondary | ICD-10-CM

## 2022-11-21 DIAGNOSIS — R2681 Unsteadiness on feet: Secondary | ICD-10-CM | POA: Diagnosis not present

## 2022-11-21 DIAGNOSIS — M6281 Muscle weakness (generalized): Secondary | ICD-10-CM
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
## 2022-11-22 ENCOUNTER — Encounter: Payer: Self-pay | Admitting: Physical Therapy

## 2022-11-25 ENCOUNTER — Ambulatory Visit: Payer: Medicare PPO | Admitting: Physical Therapy

## 2022-11-25 ENCOUNTER — Encounter: Payer: Self-pay | Admitting: Physical Therapy

## 2022-11-25 DIAGNOSIS — R2681 Unsteadiness on feet: Secondary | ICD-10-CM | POA: Diagnosis not present

## 2022-11-25 DIAGNOSIS — M6281 Muscle weakness (generalized): Secondary | ICD-10-CM | POA: Diagnosis not present

## 2022-11-25 DIAGNOSIS — R2689 Other abnormalities of gait and mobility: Secondary | ICD-10-CM | POA: Diagnosis not present
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
## 2022-11-26 ENCOUNTER — Ambulatory Visit: Payer: Medicare PPO | Admitting: Rehabilitation

## 2022-11-26 ENCOUNTER — Encounter: Payer: Self-pay | Admitting: Rehabilitation

## 2022-11-26 DIAGNOSIS — R2681 Unsteadiness on feet: Secondary | ICD-10-CM | POA: Diagnosis not present

## 2022-11-26 DIAGNOSIS — R2689 Other abnormalities of gait and mobility: Secondary | ICD-10-CM | POA: Diagnosis not present

## 2022-11-26 DIAGNOSIS — M6281 Muscle weakness (generalized): Secondary | ICD-10-CM
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
## 2022-12-03 ENCOUNTER — Ambulatory Visit: Payer: Medicare PPO | Admitting: Rehabilitation

## 2022-12-03 ENCOUNTER — Encounter: Payer: Self-pay | Admitting: Rehabilitation

## 2022-12-03 DIAGNOSIS — R2681 Unsteadiness on feet: Secondary | ICD-10-CM | POA: Diagnosis not present

## 2022-12-03 DIAGNOSIS — M6281 Muscle weakness (generalized): Secondary | ICD-10-CM | POA: Diagnosis not present

## 2022-12-03 DIAGNOSIS — R2689 Other abnormalities of gait and mobility: Secondary | ICD-10-CM
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
## 2022-12-09 ENCOUNTER — Ambulatory Visit: Payer: Medicare PPO | Admitting: Physical Therapy

## 2022-12-09 ENCOUNTER — Encounter: Payer: Self-pay | Admitting: Physical Therapy

## 2022-12-09 DIAGNOSIS — R2681 Unsteadiness on feet: Secondary | ICD-10-CM | POA: Diagnosis not present

## 2022-12-09 DIAGNOSIS — M6281 Muscle weakness (generalized): Secondary | ICD-10-CM

## 2022-12-09 DIAGNOSIS — R2689 Other abnormalities of gait and mobility: Secondary | ICD-10-CM | POA: Diagnosis not present
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
## 2022-12-12 ENCOUNTER — Ambulatory Visit: Payer: Medicare Other | Admitting: Obstetrics and Gynecology

## 2022-12-16 ENCOUNTER — Encounter: Payer: Self-pay | Admitting: Physical Therapy

## 2022-12-16 ENCOUNTER — Ambulatory Visit: Payer: Medicare PPO | Attending: Internal Medicine | Admitting: Physical Therapy

## 2022-12-16 ENCOUNTER — Ambulatory Visit: Payer: Self-pay | Admitting: Physical Therapy

## 2022-12-16 DIAGNOSIS — M6281 Muscle weakness (generalized): Secondary | ICD-10-CM | POA: Insufficient documentation

## 2022-12-16 DIAGNOSIS — R2681 Unsteadiness on feet: Secondary | ICD-10-CM | POA: Diagnosis not present

## 2022-12-16 DIAGNOSIS — R2689 Other abnormalities of gait and mobility: Secondary | ICD-10-CM | POA: Insufficient documentation
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
## 2022-12-23 ENCOUNTER — Encounter: Payer: Self-pay | Admitting: Physical Therapy

## 2022-12-23 ENCOUNTER — Ambulatory Visit: Payer: Medicare PPO | Admitting: Physical Therapy

## 2022-12-23 DIAGNOSIS — R2689 Other abnormalities of gait and mobility: Secondary | ICD-10-CM | POA: Diagnosis not present

## 2022-12-23 DIAGNOSIS — R2681 Unsteadiness on feet: Secondary | ICD-10-CM

## 2022-12-23 DIAGNOSIS — M6281 Muscle weakness (generalized): Secondary | ICD-10-CM | POA: Diagnosis not present
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
## 2022-12-24 ENCOUNTER — Ambulatory Visit: Payer: Medicare PPO | Admitting: Rehabilitation

## 2022-12-24 ENCOUNTER — Encounter: Payer: Self-pay | Admitting: Rehabilitation

## 2022-12-24 DIAGNOSIS — M6281 Muscle weakness (generalized): Secondary | ICD-10-CM

## 2022-12-24 DIAGNOSIS — R2681 Unsteadiness on feet: Secondary | ICD-10-CM

## 2022-12-24 DIAGNOSIS — R2689 Other abnormalities of gait and mobility: Secondary | ICD-10-CM | POA: Diagnosis not present
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
## 2023-01-07 ENCOUNTER — Ambulatory Visit: Payer: Medicare PPO | Admitting: Rehabilitation

## 2023-01-07 ENCOUNTER — Encounter: Payer: Self-pay | Admitting: Rehabilitation

## 2023-01-07 DIAGNOSIS — R2681 Unsteadiness on feet: Secondary | ICD-10-CM

## 2023-01-07 DIAGNOSIS — R2689 Other abnormalities of gait and mobility: Secondary | ICD-10-CM

## 2023-01-07 DIAGNOSIS — M6281 Muscle weakness (generalized): Secondary | ICD-10-CM | POA: Diagnosis not present
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
## 2023-01-08 ENCOUNTER — Other Ambulatory Visit: Payer: Self-pay | Admitting: Internal Medicine

## 2023-01-08 NOTE — Telephone Encounter (Signed)
Refill came to me by mistake

## 2023-01-13 ENCOUNTER — Ambulatory Visit: Payer: Medicare PPO | Admitting: Physical Therapy

## 2023-01-13 ENCOUNTER — Encounter: Payer: Self-pay | Admitting: Physical Therapy

## 2023-01-13 DIAGNOSIS — R2689 Other abnormalities of gait and mobility: Secondary | ICD-10-CM

## 2023-01-13 DIAGNOSIS — M6281 Muscle weakness (generalized): Secondary | ICD-10-CM | POA: Diagnosis not present

## 2023-01-13 DIAGNOSIS — R2681 Unsteadiness on feet: Secondary | ICD-10-CM
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
## 2023-01-16 ENCOUNTER — Ambulatory Visit: Payer: Medicare PPO | Attending: Internal Medicine | Admitting: Physical Therapy

## 2023-01-16 DIAGNOSIS — R2681 Unsteadiness on feet: Secondary | ICD-10-CM

## 2023-01-16 DIAGNOSIS — R2689 Other abnormalities of gait and mobility: Secondary | ICD-10-CM | POA: Diagnosis not present

## 2023-01-16 DIAGNOSIS — M6281 Muscle weakness (generalized): Secondary | ICD-10-CM | POA: Diagnosis not present
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
## 2023-01-17 ENCOUNTER — Encounter: Payer: Self-pay | Admitting: Physical Therapy

## 2023-01-21 ENCOUNTER — Ambulatory Visit: Payer: Medicare PPO | Admitting: Rehabilitation

## 2023-01-21 ENCOUNTER — Encounter: Payer: Self-pay | Admitting: Rehabilitation

## 2023-01-21 DIAGNOSIS — R2681 Unsteadiness on feet: Secondary | ICD-10-CM | POA: Diagnosis not present

## 2023-01-21 DIAGNOSIS — M6281 Muscle weakness (generalized): Secondary | ICD-10-CM | POA: Diagnosis not present

## 2023-01-21 DIAGNOSIS — R2689 Other abnormalities of gait and mobility: Secondary | ICD-10-CM
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
## 2023-01-27 ENCOUNTER — Ambulatory Visit: Payer: Medicare PPO | Admitting: Physical Therapy

## 2023-01-27 ENCOUNTER — Encounter: Payer: Self-pay | Admitting: Obstetrics and Gynecology

## 2023-01-27 ENCOUNTER — Ambulatory Visit: Payer: Medicare PPO | Admitting: Obstetrics and Gynecology

## 2023-01-27 VITALS — BP 108/70 | HR 83 | Temp 98.3°F | Wt 244.0 lb

## 2023-01-27 DIAGNOSIS — R2689 Other abnormalities of gait and mobility: Secondary | ICD-10-CM | POA: Diagnosis not present

## 2023-01-27 DIAGNOSIS — M6281 Muscle weakness (generalized): Secondary | ICD-10-CM | POA: Diagnosis not present

## 2023-01-27 DIAGNOSIS — R2681 Unsteadiness on feet: Secondary | ICD-10-CM | POA: Diagnosis not present

## 2023-01-27 DIAGNOSIS — R350 Frequency of micturition: Secondary | ICD-10-CM

## 2023-01-27 DIAGNOSIS — Z4689 Encounter for fitting and adjustment of other specified devices: Secondary | ICD-10-CM

## 2023-01-27 LAB — URINALYSIS, COMPLETE W/RFL CULTURE
Bilirubin Urine: NEGATIVE
Glucose, UA: NEGATIVE
Hgb urine dipstick: NEGATIVE
Hyaline Cast: NONE SEEN /LPF
Ketones, ur: NEGATIVE
Nitrites, Initial: NEGATIVE
Protein, ur: NEGATIVE
RBC / HPF: NONE SEEN /HPF (ref 0–2)
Specific Gravity, Urine: 1.01 (ref 1.001–1.035)
pH: 5.5 (ref 5.0–8.0)

## 2023-01-27 LAB — CULTURE INDICATED

## 2023-01-28 ENCOUNTER — Encounter: Payer: Self-pay | Admitting: Physical Therapy
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
## 2023-01-30 ENCOUNTER — Ambulatory Visit: Payer: Medicare PPO | Admitting: Physical Therapy

## 2023-01-30 DIAGNOSIS — R2689 Other abnormalities of gait and mobility: Secondary | ICD-10-CM

## 2023-01-30 DIAGNOSIS — M6281 Muscle weakness (generalized): Secondary | ICD-10-CM

## 2023-01-30 DIAGNOSIS — R2681 Unsteadiness on feet: Secondary | ICD-10-CM
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
## 2023-01-31 ENCOUNTER — Encounter: Payer: Self-pay | Admitting: Physical Therapy

## 2023-02-02 NOTE — Addendum Note (Signed)
Addended by: Althea Charon on: 02/02/2023 07:56 PM   Modules accepted: Orders

## 2023-02-04 ENCOUNTER — Ambulatory Visit: Payer: Medicare PPO | Admitting: Rehabilitation

## 2023-02-04 ENCOUNTER — Encounter: Payer: Self-pay | Admitting: Rehabilitation

## 2023-02-04 DIAGNOSIS — M6281 Muscle weakness (generalized): Secondary | ICD-10-CM | POA: Diagnosis not present

## 2023-02-04 DIAGNOSIS — R2689 Other abnormalities of gait and mobility: Secondary | ICD-10-CM | POA: Diagnosis not present

## 2023-02-04 DIAGNOSIS — R2681 Unsteadiness on feet: Secondary | ICD-10-CM | POA: Diagnosis not present
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
## 2023-02-11 ENCOUNTER — Ambulatory Visit: Payer: Medicare PPO | Admitting: Physical Therapy

## 2023-02-25 ENCOUNTER — Ambulatory Visit: Payer: Medicare PPO | Attending: Internal Medicine | Admitting: Rehabilitation

## 2023-02-25 ENCOUNTER — Encounter: Payer: Self-pay | Admitting: Rehabilitation

## 2023-02-25 DIAGNOSIS — R2689 Other abnormalities of gait and mobility: Secondary | ICD-10-CM | POA: Diagnosis not present

## 2023-02-25 DIAGNOSIS — M6281 Muscle weakness (generalized): Secondary | ICD-10-CM | POA: Diagnosis not present

## 2023-02-25 DIAGNOSIS — R2681 Unsteadiness on feet: Secondary | ICD-10-CM | POA: Diagnosis not present
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
## 2023-02-27 ENCOUNTER — Ambulatory Visit: Payer: Medicare PPO | Admitting: Physical Therapy

## 2023-02-27 ENCOUNTER — Encounter: Payer: Self-pay | Admitting: Physical Therapy

## 2023-02-27 DIAGNOSIS — R2689 Other abnormalities of gait and mobility: Secondary | ICD-10-CM

## 2023-02-27 DIAGNOSIS — M6281 Muscle weakness (generalized): Secondary | ICD-10-CM | POA: Diagnosis not present

## 2023-02-27 DIAGNOSIS — R2681 Unsteadiness on feet: Secondary | ICD-10-CM
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
## 2023-03-03 ENCOUNTER — Encounter: Payer: Self-pay | Admitting: Physical Therapy

## 2023-03-03 ENCOUNTER — Ambulatory Visit: Payer: Medicare PPO | Admitting: Physical Therapy

## 2023-03-03 DIAGNOSIS — R2689 Other abnormalities of gait and mobility: Secondary | ICD-10-CM

## 2023-03-03 DIAGNOSIS — M6281 Muscle weakness (generalized): Secondary | ICD-10-CM

## 2023-03-03 DIAGNOSIS — R2681 Unsteadiness on feet: Secondary | ICD-10-CM | POA: Diagnosis not present
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
## 2023-03-06 ENCOUNTER — Ambulatory Visit: Payer: Medicare PPO | Admitting: Physical Therapy

## 2023-03-06 DIAGNOSIS — R2681 Unsteadiness on feet: Secondary | ICD-10-CM | POA: Diagnosis not present

## 2023-03-06 DIAGNOSIS — M6281 Muscle weakness (generalized): Secondary | ICD-10-CM | POA: Diagnosis not present

## 2023-03-06 DIAGNOSIS — R2689 Other abnormalities of gait and mobility: Secondary | ICD-10-CM | POA: Diagnosis not present
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
## 2023-03-07 ENCOUNTER — Encounter: Payer: Self-pay | Admitting: Physical Therapy

## 2023-03-10 ENCOUNTER — Encounter: Payer: Self-pay | Admitting: Physical Therapy

## 2023-03-10 ENCOUNTER — Ambulatory Visit: Payer: Medicare PPO | Admitting: Physical Therapy

## 2023-03-10 DIAGNOSIS — R2681 Unsteadiness on feet: Secondary | ICD-10-CM | POA: Diagnosis not present

## 2023-03-10 DIAGNOSIS — M6281 Muscle weakness (generalized): Secondary | ICD-10-CM | POA: Diagnosis not present

## 2023-03-10 DIAGNOSIS — R2689 Other abnormalities of gait and mobility: Secondary | ICD-10-CM | POA: Diagnosis not present
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
## 2023-03-11 ENCOUNTER — Ambulatory Visit: Payer: Medicare PPO | Admitting: Rehabilitation

## 2023-03-11 ENCOUNTER — Encounter: Payer: Self-pay | Admitting: Rehabilitation

## 2023-03-11 DIAGNOSIS — M6281 Muscle weakness (generalized): Secondary | ICD-10-CM | POA: Diagnosis not present

## 2023-03-11 DIAGNOSIS — R2681 Unsteadiness on feet: Secondary | ICD-10-CM | POA: Diagnosis not present

## 2023-03-11 DIAGNOSIS — R2689 Other abnormalities of gait and mobility: Secondary | ICD-10-CM
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
## 2023-04-01 ENCOUNTER — Ambulatory Visit (INDEPENDENT_AMBULATORY_CARE_PROVIDER_SITE_OTHER): Payer: Medicare PPO | Admitting: Obstetrics and Gynecology

## 2023-04-01 ENCOUNTER — Encounter: Payer: Self-pay | Admitting: Obstetrics and Gynecology

## 2023-04-01 VITALS — BP 132/74 | Ht 62.0 in | Wt 251.0 lb

## 2023-04-01 DIAGNOSIS — N813 Complete uterovaginal prolapse: Secondary | ICD-10-CM | POA: Diagnosis not present

## 2023-04-01 DIAGNOSIS — Z4689 Encounter for fitting and adjustment of other specified devices: Secondary | ICD-10-CM | POA: Diagnosis not present

## 2023-04-15 ENCOUNTER — Ambulatory Visit: Payer: Medicare PPO | Admitting: Gastroenterology

## 2023-05-02 ENCOUNTER — Other Ambulatory Visit: Payer: Self-pay | Admitting: Internal Medicine

## 2023-05-21 DIAGNOSIS — H16143 Punctate keratitis, bilateral: Secondary | ICD-10-CM | POA: Diagnosis not present

## 2023-05-21 DIAGNOSIS — H26493 Other secondary cataract, bilateral: Secondary | ICD-10-CM | POA: Diagnosis not present

## 2023-05-21 DIAGNOSIS — Z961 Presence of intraocular lens: Secondary | ICD-10-CM | POA: Diagnosis not present

## 2023-05-21 DIAGNOSIS — H5010 Unspecified exotropia: Secondary | ICD-10-CM | POA: Diagnosis not present

## 2023-05-23 ENCOUNTER — Other Ambulatory Visit: Payer: Self-pay

## 2023-05-23 ENCOUNTER — Telehealth: Payer: Self-pay | Admitting: Cardiology

## 2023-05-23 MED ORDER — FUROSEMIDE 40 MG PO TABS: 40.0000 mg | ORAL_TABLET | Freq: Every day | ORAL | 3 refills | Status: DC

## 2023-05-23 NOTE — Progress Notes (Signed)
Per Dr. Anne Fu, an order to increase the Lasix to 40 mg has been placed. Pt is aware.

## 2023-05-23 NOTE — Telephone Encounter (Signed)
Cherylann Banas, RN at 05/23/2023  3:53 PM  Status: Signed  Per Dr. Anne Fu, an order to increase the Lasix to 40 mg has been placed. Pt is aware.

## 2023-05-23 NOTE — Telephone Encounter (Signed)
  Per MyChart Scheduling message:  Initial complaint: Excessive swelling of feet and legs, bruising   Pt c/o swelling/edema: STAT if pt has developed SOB within 24 hours  If swelling, where is the swelling located?   How much weight have you gained and in what time span?   Have you gained 2 pounds in a day or 5 pounds in a week?   Do you have a log of your daily weights (if so, list)?   Are you currently taking a fluid pill?   Are you currently SOB?   Have you traveled recently in a car or plane for an extended period of time?    1. Swelling in both feet and lower legs. Painful to walk.  2. Gained 11 lbs. since doctor visit 6 weeks ago  3. & 4..  I do not weigh myself regularly and have not kept a daily log 5. Yes 6. Occasionally  7. In a car, but for less than 2 hours   Also having another issue- in the mornings while laying in bed I hear ringing or my heart beating in my ears.

## 2023-05-28 DIAGNOSIS — H26492 Other secondary cataract, left eye: Secondary | ICD-10-CM | POA: Diagnosis not present

## 2023-06-06 ENCOUNTER — Other Ambulatory Visit: Payer: Self-pay | Admitting: Gastroenterology

## 2023-06-18 HISTORY — PX: LIPOMA EXCISION: SHX5283
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
## 2023-07-15 ENCOUNTER — Ambulatory Visit: Payer: Medicare PPO | Admitting: Radiology

## 2023-07-17 ENCOUNTER — Ambulatory Visit: Payer: Medicare PPO | Admitting: Gastroenterology

## 2023-07-23 ENCOUNTER — Encounter: Payer: Self-pay | Admitting: Obstetrics and Gynecology

## 2023-07-23 ENCOUNTER — Ambulatory Visit: Payer: Medicare PPO | Admitting: Obstetrics and Gynecology

## 2023-07-23 VITALS — BP 118/68 | HR 82 | Wt 248.0 lb

## 2023-07-23 DIAGNOSIS — Z4689 Encounter for fitting and adjustment of other specified devices: Secondary | ICD-10-CM | POA: Diagnosis not present

## 2023-07-23 DIAGNOSIS — N813 Complete uterovaginal prolapse: Secondary | ICD-10-CM | POA: Diagnosis not present

## 2023-07-23 DIAGNOSIS — N3946 Mixed incontinence: Secondary | ICD-10-CM | POA: Diagnosis not present

## 2023-08-15 ENCOUNTER — Other Ambulatory Visit: Payer: Self-pay | Admitting: Internal Medicine

## 2023-08-26 ENCOUNTER — Ambulatory Visit: Payer: Medicare PPO | Admitting: Gastroenterology

## 2023-08-26 VITALS — BP 118/68 | HR 87 | Ht 64.0 in | Wt 247.0 lb

## 2023-08-26 DIAGNOSIS — R140 Abdominal distension (gaseous): Secondary | ICD-10-CM | POA: Diagnosis not present

## 2023-08-26 DIAGNOSIS — K9289 Other specified diseases of the digestive system: Secondary | ICD-10-CM

## 2023-08-26 DIAGNOSIS — K449 Diaphragmatic hernia without obstruction or gangrene: Secondary | ICD-10-CM | POA: Diagnosis not present

## 2023-08-26 DIAGNOSIS — R142 Eructation: Secondary | ICD-10-CM | POA: Diagnosis not present

## 2023-08-26 DIAGNOSIS — K219 Gastro-esophageal reflux disease without esophagitis: Secondary | ICD-10-CM | POA: Diagnosis not present
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
## 2023-08-26 NOTE — Patient Instructions (Addendum)
 Try for next 4-6 weeks to increase your Pantoprazole to twice daily.   Send my-chart message to let us know if increasing Pantoprazole helps.   If still having issues then you may trial over the counter Extra Strength Gas-X OR Phazyme for 4-6 weeks.    Follow up on 4-6 months. Office will contact you to schedule    Focus on decreasing High Fodmap foods:Low-FODMAP Eating Plan    FODMAP stands for fermentable oligosaccharides, disaccharides, monosaccharides, and polyols. These are sugars that are hard for some people to digest. A low-FODMAP eating plan may help some people who have irritable bowel syndrome (IBS) and certain other bowel (intestinal) diseases to manage their symptoms. This meal plan can be complicated to follow. Work with a diet and nutrition specialist (dietitian) to make a low-FODMAP eating plan that is right for you. A dietitian can help make sure that you get enough nutrition from this diet. What are tips for following this plan? Reading food labels Check labels for hidden FODMAPs such as: High-fructose syrup. Honey. Agave. Natural fruit flavors. Onion or garlic powder. Choose low-FODMAP foods that contain 3-4 grams of fiber per serving. Check food labels for serving sizes. Eat only one serving at a time to make sure FODMAP levels stay low. Shopping Shop with a list of foods that are recommended on this diet and make a meal plan. Meal planning Follow a low-FODMAP eating plan for up to 6 weeks, or as told by your health care provider or dietitian. To follow the eating plan: Eliminate high-FODMAP foods from your diet completely. Choose only low-FODMAP foods to eat. You will do this for 2-6 weeks. Gradually reintroduce high-FODMAP foods into your diet one at a time. Most people should wait a few days before introducing the next new high-FODMAP food into their meal plan. Your dietitian can recommend how quickly you may reintroduce foods. Keep a daily record of what and  how much you eat and drink. Make note of any symptoms that you have after eating. Review your daily record with a dietitian regularly to identify which foods you can eat and which foods you should avoid. General tips Drink enough fluid each day to keep your urine pale yellow. Avoid processed foods. These often have added sugar and may be high in FODMAPs. Avoid most dairy products, whole grains, and sweeteners. Work with a dietitian to make sure you get enough fiber in your diet. Avoid high FODMAP foods at meals to manage symptoms. Recommended foods Fruits Bananas, oranges, tangerines, lemons, limes, blueberries, raspberries, strawberries, grapes, cantaloupe, honeydew melon, kiwi, papaya, passion fruit, and pineapple. Limited amounts of dried cranberries, banana chips, and shredded coconut. Vegetables Eggplant, zucchini, cucumber, peppers, green beans, bean sprouts, lettuce, arugula, kale, Swiss chard, spinach, collard greens, bok choy, summer squash, potato, and tomato. Limited amounts of corn, carrot, and sweet potato. Green parts of scallions. Grains Gluten-free grains, such as rice, oats, buckwheat, quinoa, corn, polenta, and millet. Gluten-free pasta, bread, or cereal. Rice noodles. Corn tortillas. Meats and other proteins Unseasoned beef, pork, poultry, or fish. Eggs. Tomasa Blase. Tofu (firm) and tempeh. Limited amounts of nuts and seeds, such as almonds, walnuts, [place] nuts, pecans, peanuts, nut butters, pumpkin seeds, chia seeds, and sunflower seeds. Dairy Lactose-free milk, yogurt, and kefir. Lactose-free cottage cheese and ice cream. Non-dairy milks, such as almond, coconut, hemp, and rice milk. Non-dairy yogurt. Limited amounts of goat cheese, brie, mozzarella, parmesan, swiss, and other hard cheeses. Fats and oils Butter-free spreads. Vegetable oils, such as olive,  canola, and sunflower oil. Seasoning and other foods Artificial sweeteners with names that do not end in "ol," such as  aspartame, saccharine, and stevia. Maple syrup, white table sugar, raw sugar, brown sugar, and molasses. Mayonnaise, soy sauce, and tamari. Fresh basil, coriander, parsley, rosemary, and thyme. Beverages Water and mineral water. Sugar-sweetened soft drinks. Small amounts of orange juice or cranberry juice. Black and green tea. Most dry wines. Coffee. The items listed above may not be a complete list of foods and beverages you can eat. Contact a dietitian for more information. Foods to avoid Fruits Fresh, dried, and juiced forms of apple, pear, watermelon, peach, plum, cherries, apricots, blackberries, boysenberries, figs, nectarines, and mango. Avocado. Vegetables Chicory root, artichoke, asparagus, cabbage, snow peas, Brussels sprouts, broccoli, sugar snap peas, mushrooms, celery, and cauliflower. Onions, garlic, leeks, and the white part of scallions. Grains Wheat, including kamut, durum, and semolina. Barley and bulgur. Couscous. Wheat-based cereals. Wheat noodles, bread, crackers, and pastries. Meats and other proteins Fried or fatty meat. Sausage. Cashews and pistachios. Soybeans, baked beans, black beans, chickpeas, kidney beans, fava beans, navy beans, lentils, black-eyed peas, and split peas. Dairy Milk, yogurt, ice cream, and soft cheese. Cream and sour cream. Milk-based sauces. Custard. Buttermilk. Soy milk. Seasoning and other foods Any sugar-free gum or candy. Foods that contain artificial sweeteners such as sorbitol, mannitol, isomalt, or xylitol. Foods that contain honey, high-fructose corn syrup, or agave. Bouillon, vegetable stock, beef stock, and chicken stock. Garlic and onion powder. Condiments made with onion, such as hummus, chutney, pickles, relish, salad dressing, and salsa. Tomato paste. Beverages Chicory-based drinks. Coffee substitutes. Chamomile tea. Fennel tea. Sweet or fortified wines such as port or sherry. Diet soft drinks made with isomalt, mannitol, maltitol,  sorbitol, or xylitol. Apple, pear, and mango juice. Juices with high-fructose corn syrup. The items listed above may not be a complete list of foods and beverages you should avoid. Contact a dietitian for more information. Summary FODMAP stands for fermentable oligosaccharides, disaccharides, monosaccharides, and polyols. These are sugars that are hard for some people to digest. A low-FODMAP eating plan is a short-term diet that helps to ease symptoms of certain bowel diseases. The eating plan usually lasts up to 6 weeks. After that, high-FODMAP foods are reintroduced gradually and one at a time. This can help you find out which foods may be causing symptoms. A low-FODMAP eating plan can be complicated. It is best to work with a dietitian who has experience with this type of plan. This information is not intended to replace advice given to you by your health care provider. Make sure you discuss any questions you have with your health care provider. Document Revised: 10/21/2019 Document Reviewed: 10/21/2019 Elsevier Patient Education  2024 Elsevier Inc.    _______________________________________________________  If your blood pressure at your visit was 140/90 or greater, please contact your primary care physician to follow up on this.  _______________________________________________________  If you are age 60 or older, your body mass index should be between 23-30. Your Body mass index is 42.4 kg/m. If this is out of the aforementioned range listed, please consider follow up with your Primary Care Provider.  If you are age 23 or younger, your body mass index should be between 19-25. Your Body mass index is 42.4 kg/m. If this is out of the aformentioned range listed, please consider follow up with your Primary Care Provider.   ________________________________________________________  The [facility] providers would like to encourage you to use [facility] to communicate with  providers for  non-urgent requests or questions.  Due to long hold times on the telephone, sending your provider a message by [facility] may be a faster and more efficient way to get a response.  Please allow 48 business hours for a response.  Please remember that this is for non-urgent requests.  _______________________________________________________  Thank you for choosing me and [facility].  Dr. Meridee Score

## 2023-08-29 ENCOUNTER — Encounter: Payer: Self-pay | Admitting: Gastroenterology

## 2023-08-29 DIAGNOSIS — K9289 Other specified diseases of the digestive system: Secondary | ICD-10-CM | POA: Insufficient documentation

## 2023-08-29 DIAGNOSIS — R142 Eructation: Secondary | ICD-10-CM | POA: Insufficient documentation

## 2023-09-08 DIAGNOSIS — H16223 Keratoconjunctivitis sicca, not specified as Sjogren's, bilateral: Secondary | ICD-10-CM | POA: Diagnosis not present

## 2023-09-10 ENCOUNTER — Other Ambulatory Visit: Payer: Self-pay | Admitting: Gastroenterology

## 2023-09-15 ENCOUNTER — Other Ambulatory Visit: Payer: Self-pay

## 2023-09-15 ENCOUNTER — Other Ambulatory Visit: Payer: Self-pay | Admitting: Internal Medicine

## 2023-10-20 ENCOUNTER — Other Ambulatory Visit: Payer: Self-pay | Admitting: Internal Medicine

## 2023-10-21 ENCOUNTER — Other Ambulatory Visit: Payer: Self-pay

## 2023-10-21 ENCOUNTER — Encounter: Payer: Self-pay | Admitting: Obstetrics and Gynecology

## 2023-10-21 ENCOUNTER — Ambulatory Visit: Payer: Medicare PPO | Admitting: Obstetrics and Gynecology

## 2023-10-21 VITALS — BP 126/84 | HR 91

## 2023-10-21 DIAGNOSIS — Z4689 Encounter for fitting and adjustment of other specified devices: Secondary | ICD-10-CM

## 2023-10-21 DIAGNOSIS — N813 Complete uterovaginal prolapse: Secondary | ICD-10-CM
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
## 2023-10-30 ENCOUNTER — Encounter: Payer: Self-pay | Admitting: Obstetrics

## 2023-10-30 ENCOUNTER — Ambulatory Visit: Payer: Medicare PPO | Admitting: Obstetrics

## 2023-10-30 ENCOUNTER — Other Ambulatory Visit (HOSPITAL_COMMUNITY)
Admission: RE | Admit: 2023-10-30 | Discharge: 2023-10-30 | Disposition: A | Discharge status: Home / Self Care | Source: Ambulatory Visit | Attending: Obstetrics | Admitting: Obstetrics

## 2023-10-30 VITALS — BP 126/69 | HR 94 | Ht 62.0 in | Wt 248.0 lb

## 2023-10-30 DIAGNOSIS — Z6841 Body Mass Index (BMI) 40.0 and over, adult: Secondary | ICD-10-CM | POA: Diagnosis not present

## 2023-10-30 DIAGNOSIS — R8290 Unspecified abnormal findings in urine: Secondary | ICD-10-CM | POA: Insufficient documentation

## 2023-10-30 DIAGNOSIS — N813 Complete uterovaginal prolapse: Secondary | ICD-10-CM | POA: Diagnosis not present

## 2023-10-30 DIAGNOSIS — N3946 Mixed incontinence: Secondary | ICD-10-CM | POA: Diagnosis not present

## 2023-10-30 DIAGNOSIS — R159 Full incontinence of feces: Secondary | ICD-10-CM | POA: Diagnosis not present

## 2023-10-30 LAB — POCT URINALYSIS DIP (CLINITEK)
Bilirubin, UA: NEGATIVE
Blood, UA: NEGATIVE
Glucose, UA: NEGATIVE mg/dL
Ketones, POC UA: NEGATIVE mg/dL
Nitrite, UA: NEGATIVE
POC PROTEIN,UA: NEGATIVE
Spec Grav, UA: 1.01 (ref 1.010–1.025)
Urobilinogen, UA: 0.2 U/dL
pH, UA: 5.5 (ref 5.0–8.0)

## 2023-10-30 NOTE — Assessment & Plan Note (Signed)
-   symptoms worsened 3-4 months ago, 2x/week - history of diverticular abscess with IR drain placement in 2016 - continues fiber supplementation, encouraged to titrate for stool bulking and follow-up with GI if refractory symptoms - Treatment options include anti-diarrhea medication (loperamide/ Imodium OTC or prescription lomotil), fiber supplements, physical therapy, and possible sacral neuromodulation or surgery.   - last colonoscopy in 2021 - reviewed sacral neuromodulation for refractory bladder and bowel symptoms with handout reviewed

## 2023-10-30 NOTE — Assessment & Plan Note (Signed)
-   discussed increased risk of BMI with pelvic floor disorders - reviewed 5-7% weight loss with up to 50% reduction in urinary symptoms

## 2023-10-30 NOTE — Assessment & Plan Note (Addendum)
-   reports most bothersome leakage with urgency - We discussed the symptoms of overactive bladder (OAB), which include urinary urgency, urinary frequency, nocturia, with or without urge incontinence.  While we do not know the exact etiology of OAB, several treatment options exist. We discussed management including behavioral therapy (decreasing bladder irritants, urge suppression strategies, timed voids, bladder retraining), physical therapy, medication; for refractory cases posterior tibial nerve stimulation, sacral neuromodulation, and intravesical botulinum toxin injection.  For anticholinergic medications, we discussed the potential side effects of anticholinergics including dry eyes, dry mouth, constipation, cognitive impairment and urinary retention. For Beta-3 agonist medication, we discussed the potential side effect of elevated blood pressure which is more likely to occur in individuals with uncontrolled hypertension. - continue Gemtesa  - unable to start anti-cholinergic due to concomitant K supplementation for lasix  use - For refractory OAB we reviewed the procedure for intravesical Botox injection with cystoscopy in the office and reviewed the risks, benefits and alternatives of treatment including but not limited to infection, need for self-catheterization and need for repeat therapy.  We discussed that there is a 5-15% chance of needing to catheterize with Botox and that this usually resolves in a few months; however can persist for longer periods of time.  Typically Botox injections would need to be repeated every 3-12 months since this is not a permanent therapy.   We discussed the role of sacral neuromodulation and how it works. It requires a test phase, and documentation of bladder function via diary. After a successful test period, a permanent wire and generator are placed in the OR. The battery lasts 5 years on average and would need to be replaced surgically.  The goal of this therapy is  at least a 50% improvement in symptoms. It is NOT realistic to expect a 100% cure.  We reviewed the fact that about 30% of patients fail the test phase and are not candidates for permanent generator placement.  We discussed the risk of infection and that the patient would not be able to get an MRI once the device is placed. There are two companies that provide this therapy: [employer] and [employer]. [employer]' product is new and is similar to [employer]'s, but has advantages of a smaller and rechargeable battery and being able to have an MRI with the implant. For all procedures, we discussed risks of bleeding, infection, damage to surrounding organs including bowel, bladder, blood vessels, ureters and nerves, need for further surgery, risk of postoperative urinary incontinence or retention with need to catheterize, recurrent prolapse, numbness and weakness at any body site, buttock pain, and the rarer risks of blood clot, heart attack, pneumonia, death.    We also discussed the role of percutaneous tibial nerve stimulation and how it works.  She understands it requires 12 weekly visits for temporary neuromodulation of the sacral nerve roots via the tibial nerve and that she may then require continued tapered treatment.  She will return for the procedure. All questions were answered. - encouraged Kegel exercises and consider referral to pelvic floor PT - encouraged weight reduction and association with pelvic floor disorders

## 2023-10-30 NOTE — Patient Instructions (Addendum)
 You have a stage 4 (out of 4) prolapse.  We discussed the fact that it is not life threatening but there are several treatment options. For treatment of pelvic organ prolapse, we discussed options for management including expectant management, conservative management, and surgical management, such as Kegels, a pessary, pelvic floor physical therapy, and specific surgical procedures.     Continue 3 month pessary follow-up with Dr. Colvin Dec.   We discussed the symptoms of overactive bladder (OAB), which include urinary urgency, urinary frequency, night-time urination, with or without urge incontinence.  We discussed management including behavioral therapy (decreasing bladder irritants by following a bladder diet, urge suppression strategies, timed voids, bladder retraining), physical therapy, medication; and for refractory cases posterior tibial nerve stimulation, sacral neuromodulation, and intravesical botulinum toxin injection.   Continue Gemtesa .   For refractory OAB we reviewed the procedure for intravesical Botox injection with cystoscopy in the office and reviewed the risks, benefits and alternatives of treatment including but not limited to infection, need for self-catheterization and need for repeat therapy.  We discussed that there is a 5-15% chance of needing to catheterize with Botox and that this usually resolves in a few months; however can persist for longer periods of time.  Typically Botox injections would need to be repeated every 3-12 months since this is not a permanent therapy.   We discussed the role of sacral neuromodulation and how it works. It requires a test phase, and documentation of bladder function via diary. After a successful test period, a permanent wire and generator are placed in the OR. The battery lasts 5 years on average and would need to be replaced surgically.  The goal of this therapy is at least a 50% improvement in symptoms. It is NOT realistic to expect a 100% cure.   We reviewed the fact that about 30% of patients fail the test phase and are not candidates for permanent generator placement.  We discussed the risk of infection and that the patient would not be able to get an MRI once the device is placed. There are two companies that provide this therapy: [employer] and [employer]. [employer]' product is new and is similar to [employer]'s, but has advantages of a smaller and rechargeable battery and being able to have an MRI with the implant. For all procedures, we discussed risks of bleeding, infection, damage to surrounding organs including bowel, bladder, blood vessels, ureters and nerves, need for further surgery, risk of postoperative urinary incontinence or retention with need to catheterize, recurrent prolapse, numbness and weakness at any body site, buttock pain, and the rarer risks of blood clot, heart attack, pneumonia, death.    We also discussed the role of percutaneous tibial nerve stimulation and how it works.  She understands it requires 12 weekly visits for temporary neuromodulation of the sacral nerve roots via the tibial nerve and that she may then require continued tapered treatment.  She will return for the procedure. All questions were answered.  Accidental Bowel Leakage:  - Treatment options include anti-diarrhea medication (loperamide/ Imodium OTC or prescription lomotil), fiber supplements, physical therapy, and possible sacral neuromodulation or surgery.     Increase your fiber supplementation up to 3 teaspoons/day if you do not experience bloating.   Follow-up with gastroenterology if your symptoms do not improve.

## 2023-10-30 NOTE — Assessment & Plan Note (Signed)
-   using size 3 1/4 gellhorn pessary since 2021 - For treatment of pelvic organ prolapse, we discussed options for management including expectant management, conservative management, and surgical management, such as Kegels, a pessary, pelvic floor physical therapy, and specific surgical procedures. - encouraged Kegel exercises - desires to continue pessary management at this time - concerns with pelvic adhesions due to history of diverticular abscess

## 2023-10-30 NOTE — Assessment & Plan Note (Signed)
-   POCT + leuk only, pending culture. Denies UTI symptoms with PVR 11mL

## 2023-10-31 ENCOUNTER — Ambulatory Visit: Payer: Self-pay | Admitting: Obstetrics

## 2023-10-31 LAB — URINE CULTURE: Culture: NO GROWTH

## 2023-11-04 DIAGNOSIS — H16143 Punctate keratitis, bilateral: Secondary | ICD-10-CM | POA: Diagnosis not present

## 2023-11-17 ENCOUNTER — Ambulatory Visit (INDEPENDENT_AMBULATORY_CARE_PROVIDER_SITE_OTHER): Payer: Medicare PPO

## 2023-11-17 ENCOUNTER — Ambulatory Visit: Attending: Cardiology | Admitting: Cardiology

## 2023-11-17 ENCOUNTER — Encounter: Payer: Self-pay | Admitting: Cardiology

## 2023-11-17 VITALS — Ht 63.0 in | Wt 245.0 lb

## 2023-11-17 VITALS — BP 144/79 | HR 93 | Ht 62.0 in | Wt 245.0 lb

## 2023-11-17 DIAGNOSIS — R931 Abnormal findings on diagnostic imaging of heart and coronary circulation: Secondary | ICD-10-CM

## 2023-11-17 DIAGNOSIS — Z1231 Encounter for screening mammogram for malignant neoplasm of breast: Secondary | ICD-10-CM

## 2023-11-17 DIAGNOSIS — I10 Essential (primary) hypertension: Secondary | ICD-10-CM

## 2023-11-17 DIAGNOSIS — Z0000 Encounter for general adult medical examination without abnormal findings: Secondary | ICD-10-CM | POA: Diagnosis not present

## 2023-11-17 DIAGNOSIS — I2583 Coronary atherosclerosis due to lipid rich plaque: Secondary | ICD-10-CM | POA: Diagnosis not present

## 2023-11-17 DIAGNOSIS — E7800 Pure hypercholesterolemia, unspecified: Secondary | ICD-10-CM | POA: Diagnosis not present

## 2023-11-17 DIAGNOSIS — I2510 Atherosclerotic heart disease of native coronary artery without angina pectoris: Secondary | ICD-10-CM

## 2023-11-17 NOTE — Progress Notes (Signed)
 Cardiology Office Note:  .   Date:  11/17/2023  ID:  FRANCES AMBROSINO, DOB March 01, 1951, MRN [id number] PCP: Roslyn Coombe, MD  [facility] Providers Cardiologist:  Dorothye Gathers, MD     History of Present Illness: .   Carolyne D Taves is a 73 y.o. female Discussed the use of AI scribe software for clinical note transcription with the patient, who gave verbal consent to proceed.  History of Present Illness Nejla D Overstreet "Carolanne Church" is a 74 year old female with coronary artery disease who presents for a follow-up visit.  She had a coronary CT in 2023 which showed a calcium  score of 440. A pharmacologic stress test performed at that time showed no ischemia. No current angina-type symptoms. She is taking amlodipine  5 mg, irbesartan  hydrochlorothiazide  300/12.5 mg daily, Crestor  10 mg daily, aspirin  81 mg daily, and furosemide  40 mg daily.  She experiences more edema in her foot, sometimes causing pain when walking. Her furosemide  dose was increased to 40 mg daily a few months ago to manage the swelling. Recent laboratory results showed a creatinine of 0.96 and potassium of 3.9.  She experiences tinnitus, described as a 'regular beat' initially thought to be her heartbeat, mostly occurring when lying in bed in a quiet environment. It has not been bothering her much lately.  She has noticed bruising and a difference in skin pigmentation on her hands, with one hand appearing darker than the other. She has a history of muscle and nerve tissue issues.  No chest pain or angina-type symptoms. Her blood pressure was noted to be higher than usual during the visit.     Studies Reviewed: Aaron Aas    EKG Interpretation Date/Time:  Monday November 17 2023 09:28:08 EDT Ventricular Rate:  93 PR Interval:  186 QRS Duration:  104 QT Interval:  354 QTC Calculation: 440 R Axis:   44  Text Interpretation: Sinus rhythm with Fusion complexes T wave abnormality, consider inferior ischemia When compared with  ECG of 19-Feb-2000 16:51, Fusion complexes are now Present T wave inversion now evident in Inferior leads T wave inversion now evident in Lateral leads Confirmed by Dorothye Gathers ([id number]) on 11/17/2023 10:01:37 AM    Results LABS Cr: 0.96 K: 3.9  RADIOLOGY Coronary CT: Calcium  score 440 (2023)  DIAGNOSTIC Pharmacologic stress test: No ischemia (2023) EKG: T wave inversions (11/17/2023)  Risk Assessment/Calculations:           Physical Exam:   VS:  BP (!) 144/79   Pulse 93   Ht 5\' 2"  (1.575 m)   Wt 245 lb (111.1 kg)   LMP 06/17/1997 (Approximate)   SpO2 95%   BMI 44.81 kg/m    Wt Readings from Last 3 Encounters:  11/17/23 245 lb (111.1 kg)  10/30/23 248 lb (112.5 kg)  08/26/23 247 lb (112 kg)    GEN: Well nourished, well developed in no acute distress NECK: No JVD; No carotid bruits CARDIAC: RRR, no murmurs, no rubs, no gallops RESPIRATORY:  Clear to auscultation without rales, wheezing or rhonchi  ABDOMEN: Soft, non-tender, non-distended EXTREMITIES: Chronic lower extremity edema; No deformity, mild hyperpigmentation changes on the right wrist  ASSESSMENT AND PLAN: .    Assessment and Plan Assessment & Plan Coronary artery disease due to lipid rich plaque Coronary artery disease is well-managed with no angina symptoms.  EKG does show some T wave inversions however heart rate was accelerated.  Previous stress test showed no ischemia. Current regimen includes Crestor , low-dose aspirin , and  antihypertensives to stabilize plaque and prevent myocardial infarction. Bruising may be related to low-dose aspirin . - Continue Crestor  10 mg daily - Continue low-dose aspirin  81 mg daily - Continue current antihypertensives - Schedule follow-up in one year  Pure hypercholesterolemia Hypercholesterolemia is managed with Crestor  to stabilize lipid-rich plaque and prevent cardiovascular events. - Continue Crestor  10 mg daily  Primary hypertension Blood pressure is slightly elevated,  possibly due to excitement or new environment. Current regimen includes amlodipine  and irbesartan -hydrochlorothiazide . Home blood pressure monitoring is not utilized due to synchronization issues. - Continue amlodipine  5 mg daily - Continue irbesartan -hydrochlorothiazide  300/12.5 mg daily - Encourage periodic blood pressure checks at home if possible  Edema Persistent and sometimes painful edema in the foot. Increased furosemide  to 40 mg daily is helping manage swelling. Kidney function and potassium levels are stable. - Continue furosemide  40 mg daily  Tinnitus Intermittent tinnitus, described as a regular beat, possibly related to vascular structures near the ear. Symptoms are benign and rarely bothersome. - Report any significant changes in symptoms         Dispo: 1 year with APP, 2 with me  Signed, Dorothye Gathers, MD

## 2023-11-17 NOTE — Patient Instructions (Addendum)
 Veronica [name] , Thank you for taking time out of your busy schedule to complete your Annual Wellness Visit with me. I enjoyed our conversation and look forward to speaking with you again next year. I, as well as your care team,  appreciate your ongoing commitment to your health goals. Please review the following plan we discussed and let me know if I can assist you in the future. Your Game plan/ To Do List    Referrals: If you haven't heard from the office you've been referred to, please reach out to them at the phone provided.  Referral for a Mammogram to the [facility] Follow up Visits: Next Medicare AWV with our clinical staff: 11/18/2024   Have you seen your provider in the last 6 months (3 months if uncontrolled diabetes)? No Next Office Visit with your provider: 12/12/2023 - Physical at 9:00am  Clinician Recommendations:  Aim for 30 minutes of exercise or brisk walking, 6-8 glasses of water, and 5 servings of fruits and vegetables each day. Educated and advised on getting the COVID, Shingles, and Tdap (Tetenus) vaccines in 2025.      This is a list of the screening recommended for you and due dates:  Health Maintenance  Topic Date Due   Zoster (Shingles) Vaccine (1 of 2) 05/17/1999   DTaP/Tdap/Td vaccine (2 - Td or Tdap) 12/26/2020   COVID-19 Vaccine (7 - 2024-25 season) 02/16/2023   Mammogram  11/17/2023   Flu Shot  01/16/2024   Medicare Annual Wellness Visit  11/16/2024   Colon Cancer Screening  09/07/2026   Pneumonia Vaccine  Completed   DEXA scan (bone density measurement)  Completed   Hepatitis C Screening  Completed   HPV Vaccine  Aged Out   Meningitis B Vaccine  Aged Out    Advanced directives: (Provided) Advance directive discussed with you today. I have provided a copy for you to complete at home and have notarized. Once this is complete, please bring a copy in to our office so we can scan it into your chart.  Advance Care Planning is important because it:  [x]  Makes  sure you receive the medical care that is consistent with your values, goals, and preferences  [x]  It provides guidance to your family and loved ones and reduces their decisional burden about whether or not they are making the right decisions based on your wishes.  Follow the link provided in your after visit summary or read over the paperwork we have mailed to you to help you started getting your Advance Directives in place. If you need assistance in completing these, please reach out to us  so that we can help you!
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
## 2023-11-17 NOTE — Patient Instructions (Signed)
 Medication Instructions:  The current medical regimen is effective;  continue present plan and medications.  *If you need a refill on your cardiac medications before your next appointment, please call your pharmacy*  Follow-Up: At [facility], you and your health needs are our priority.  As part of our continuing mission to provide you with exceptional heart care, our providers are all part of one team.  This team includes your primary Cardiologist (physician) and Advanced Practice Providers or APPs (Physician Assistants and Nurse Practitioners) who all work together to provide you with the care you need, when you need it.  Your next appointment:   1 year(s)  Provider:   One of our Advanced Practice Providers (APPs): Melita Springer, PA-C  Friddie Jetty, NP Evaline Hill, NP  Theotis Flake, PA-C Lawana Pray, NP  Willis Harter, PA-C Lovette Rud, PA-C  [place], PA-C Ernest Dick, NP  Marlana Silvan, NP Marcie Sever, PA-C  Laquita Plant, PA-C    Dayna Dunn, PA-C  Scott Weaver, PA-C Palmer Bobo, NP Katlyn West, NP Callie Goodrich, PA-C  Evan Williams, PA-C Sheng Haley, PA-C  Xika Zhao, NP Kathleen Johnson, PA-C   Then, Dorothye Gathers, MD will plan to see you again in 2 year(s).    We recommend signing up for the patient portal called "MyChart".  Sign up information is provided on this After Visit Summary.  MyChart is used to connect with patients for Virtual Visits (Telemedicine).  Patients are able to view lab/test results, encounter notes, upcoming appointments, etc.  Non-urgent messages can be sent to your provider as well.   To learn more about what you can do with MyChart, go to [email or web address].

## 2023-12-12 ENCOUNTER — Encounter: Admitting: Internal Medicine

## 2023-12-16 ENCOUNTER — Ambulatory Visit
Admission: RE | Admit: 2023-12-16 | Discharge: 2023-12-16 | Disposition: A | Discharge status: Home / Self Care | Source: Ambulatory Visit | Attending: Internal Medicine | Admitting: Internal Medicine

## 2023-12-16 DIAGNOSIS — Z1231 Encounter for screening mammogram for malignant neoplasm of breast: Secondary | ICD-10-CM | POA: Diagnosis not present

## 2023-12-30 DIAGNOSIS — H26491 Other secondary cataract, right eye: Secondary | ICD-10-CM | POA: Diagnosis not present

## 2023-12-30 DIAGNOSIS — Z961 Presence of intraocular lens: Secondary | ICD-10-CM | POA: Diagnosis not present

## 2023-12-30 DIAGNOSIS — H16143 Punctate keratitis, bilateral: Secondary | ICD-10-CM | POA: Diagnosis not present

## 2023-12-30 DIAGNOSIS — H5010 Unspecified exotropia: Secondary | ICD-10-CM | POA: Diagnosis not present

## 2024-01-02 ENCOUNTER — Ambulatory Visit: Payer: Self-pay | Admitting: Internal Medicine

## 2024-01-02 ENCOUNTER — Encounter: Payer: Self-pay | Admitting: Internal Medicine

## 2024-01-02 ENCOUNTER — Ambulatory Visit: Admitting: Internal Medicine

## 2024-01-02 VITALS — BP 112/72 | HR 90 | Temp 97.9°F | Wt 247.4 lb

## 2024-01-02 DIAGNOSIS — E559 Vitamin D deficiency, unspecified: Secondary | ICD-10-CM | POA: Diagnosis not present

## 2024-01-02 DIAGNOSIS — L989 Disorder of the skin and subcutaneous tissue, unspecified: Secondary | ICD-10-CM | POA: Diagnosis not present

## 2024-01-02 DIAGNOSIS — E538 Deficiency of other specified B group vitamins: Secondary | ICD-10-CM | POA: Diagnosis not present

## 2024-01-02 DIAGNOSIS — Z0000 Encounter for general adult medical examination without abnormal findings: Secondary | ICD-10-CM | POA: Diagnosis not present

## 2024-01-02 DIAGNOSIS — Z0001 Encounter for general adult medical examination with abnormal findings: Secondary | ICD-10-CM

## 2024-01-02 DIAGNOSIS — I10 Essential (primary) hypertension: Secondary | ICD-10-CM

## 2024-01-02 DIAGNOSIS — E7800 Pure hypercholesterolemia, unspecified: Secondary | ICD-10-CM | POA: Diagnosis not present

## 2024-01-02 DIAGNOSIS — R739 Hyperglycemia, unspecified: Secondary | ICD-10-CM | POA: Diagnosis not present

## 2024-01-02 LAB — BASIC METABOLIC PANEL WITH GFR
BUN: 20 mg/dL (ref 6–23)
CO2: 30 meq/L (ref 19–32)
Calcium: 9.4 mg/dL (ref 8.4–10.5)
Chloride: 102 meq/L (ref 96–112)
Creatinine, Ser: 1.05 mg/dL (ref 0.40–1.20)
GFR: 52.3 mL/min — ABNORMAL LOW (ref >60.00)
Glucose, Bld: 95 mg/dL (ref 70–99)
Potassium: 4.6 meq/L (ref 3.5–5.1)
Sodium: 139 meq/L (ref 135–145)

## 2024-01-02 LAB — LIPID PANEL
Cholesterol: 130 mg/dL (ref 0–200)
HDL: 63 mg/dL (ref >39.00)
LDL Cholesterol: 58 mg/dL (ref 0–99)
NonHDL: 66.97
Total CHOL/HDL Ratio: 2
Triglycerides: 43 mg/dL (ref 0.0–149.0)
VLDL: 8.6 mg/dL (ref 0.0–40.0)

## 2024-01-02 LAB — URINALYSIS, ROUTINE W REFLEX MICROSCOPIC
Bilirubin Urine: NEGATIVE
Hgb urine dipstick: NEGATIVE
Ketones, ur: NEGATIVE
Nitrite: NEGATIVE
Specific Gravity, Urine: 1.01 (ref 1.000–1.030)
Total Protein, Urine: NEGATIVE
Urine Glucose: NEGATIVE
Urobilinogen, UA: 0.2 (ref 0.0–1.0)
pH: 7 (ref 5.0–8.0)

## 2024-01-02 LAB — CBC WITH DIFFERENTIAL/PLATELET
Basophils Absolute: 0 K/uL (ref 0.0–0.1)
Basophils Relative: 0.7 % (ref 0.0–3.0)
Eosinophils Absolute: 0.2 K/uL (ref 0.0–0.7)
Eosinophils Relative: 2.6 % (ref 0.0–5.0)
HCT: 38.5 % (ref 36.0–46.0)
Hemoglobin: 12.5 g/dL (ref 12.0–15.0)
Lymphocytes Relative: 25.1 % (ref 12.0–46.0)
Lymphs Abs: 1.6 K/uL (ref 0.7–4.0)
MCHC: 32.4 g/dL (ref 30.0–36.0)
MCV: 90.6 fl (ref 78.0–100.0)
Monocytes Absolute: 0.6 K/uL (ref 0.1–1.0)
Monocytes Relative: 8.7 % (ref 3.0–12.0)
Neutro Abs: 4 K/uL (ref 1.4–7.7)
Neutrophils Relative %: 62.9 % (ref 43.0–77.0)
Platelets: 243 K/uL (ref 150.0–400.0)
RBC: 4.25 Mil/uL (ref 3.87–5.11)
RDW: 14 % (ref 11.5–15.5)
WBC: 6.3 K/uL (ref 4.0–10.5)

## 2024-01-02 LAB — HEPATIC FUNCTION PANEL
ALT: 11 U/L (ref 0–35)
AST: 16 U/L (ref 0–37)
Albumin: 4.1 g/dL (ref 3.5–5.2)
Alkaline Phosphatase: 70 U/L (ref 39–117)
Bilirubin, Direct: 0.2 mg/dL (ref 0.0–0.3)
Total Bilirubin: 1.1 mg/dL (ref 0.2–1.2)
Total Protein: 8 g/dL (ref 6.0–8.3)

## 2024-01-02 LAB — HEMOGLOBIN A1C: Hgb A1c MFr Bld: 5.5 % (ref 4.6–6.5)

## 2024-01-02 LAB — VITAMIN D 25 HYDROXY (VIT D DEFICIENCY, FRACTURES): VITD: 53.97 ng/mL (ref 30.00–100.00)

## 2024-01-02 LAB — TSH: TSH: 3.23 u[IU]/mL (ref 0.35–5.50)

## 2024-01-02 LAB — VITAMIN B12: Vitamin B-12: 462 pg/mL (ref 211–911)

## 2024-01-02 MED ORDER — PANTOPRAZOLE SODIUM 40 MG PO TBEC: DELAYED_RELEASE_TABLET | ORAL | 0 refills | Status: DC

## 2024-01-02 MED ORDER — AMLODIPINE BESYLATE 5 MG PO TABS: ORAL_TABLET | ORAL | 3 refills | Status: DC

## 2024-01-02 NOTE — Assessment & Plan Note (Signed)
Age and sex appropriate education and counseling updated with regular exercise and diet Referrals for preventative services - none needed Immunizations addressed - for shingrix and tdap at pharmacy Smoking counseling  - none needed Evidence for depression or other mood disorder - none significant Most recent labs reviewed. I have personally reviewed and have noted: 1) the patient's medical and social history 2) The patient's current medications and supplements 3) The patient's height, weight, and BMI have been recorded in the chart  

## 2024-01-02 NOTE — Assessment & Plan Note (Signed)
 BP Readings from Last 3 Encounters:  01/02/24 112/72  11/17/23 (!) 144/79  10/30/23 126/69   Stable, pt to continue medical treatment norvasc  5 qd

## 2024-01-02 NOTE — Assessment & Plan Note (Signed)
 Lab Results  Component Value Date   LDLCALC 58 01/02/2024   Stable, pt to continue current statin crestor  10 mg qd

## 2024-01-02 NOTE — Assessment & Plan Note (Signed)
 Last vitamin D  Lab Results  Component Value Date   VD25OH 53.97 01/02/2024   Stable, cont oral replacement

## 2024-01-02 NOTE — Progress Notes (Signed)
 The test results show that your current treatment is OK, as the tests are stable.  Please continue the same plan.  There is no other need for change of treatment or further evaluation based on these results, at this time.  thanks

## 2024-01-02 NOTE — Patient Instructions (Addendum)
 Please have your Shingrix (shingles) shots done at your local pharmacy., and the Tdap tetanus shot as well  Please continue all other medications as before, and refills have been done if requested.  Please have the pharmacy call with any other refills you may need.  Please continue your efforts at being more active, low cholesterol diet, and weight control.  You are otherwise up to date with prevention measures today.  Please keep your appointments with your specialists as you may have planned  You will be contacted regarding the referral for: dermatology  Please go to the LAB at the blood drawing area for the tests to be done  You will be contacted by phone if any changes need to be made immediately.  Otherwise, you will receive a letter about your results with an explanation, but please check with MyChart first.  Please make an Appointment to return in 6 months, or sooner if needed

## 2024-01-02 NOTE — Assessment & Plan Note (Addendum)
 Worsening, C/w large skin tag - for derm referrral
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
## 2024-01-02 NOTE — Assessment & Plan Note (Signed)
 Lab Results  Component Value Date   HGBA1C 5.5 01/02/2024   Stable, pt to continue current medical treatment  - diet, wt control
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
## 2024-01-27 ENCOUNTER — Encounter: Payer: Self-pay | Admitting: Obstetrics and Gynecology

## 2024-01-27 ENCOUNTER — Ambulatory Visit: Admitting: Obstetrics and Gynecology

## 2024-01-27 VITALS — BP 124/74 | HR 103

## 2024-01-27 DIAGNOSIS — Z4689 Encounter for fitting and adjustment of other specified devices: Secondary | ICD-10-CM

## 2024-01-27 DIAGNOSIS — N813 Complete uterovaginal prolapse: Secondary | ICD-10-CM

## 2024-02-02 ENCOUNTER — Ambulatory Visit: Admitting: Obstetrics

## 2024-02-06 ENCOUNTER — Encounter: Payer: Self-pay | Admitting: Obstetrics

## 2024-02-06 ENCOUNTER — Ambulatory Visit: Admitting: Obstetrics

## 2024-02-06 VITALS — BP 123/76 | HR 89

## 2024-02-06 DIAGNOSIS — N813 Complete uterovaginal prolapse: Secondary | ICD-10-CM

## 2024-02-06 DIAGNOSIS — N3946 Mixed incontinence: Secondary | ICD-10-CM | POA: Diagnosis not present

## 2024-02-06 DIAGNOSIS — R159 Full incontinence of feces: Secondary | ICD-10-CM

## 2024-02-06 DIAGNOSIS — Z960 Presence of urogenital implants: Secondary | ICD-10-CM | POA: Diagnosis not present
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
## 2024-02-06 NOTE — Assessment & Plan Note (Addendum)
-   reports most bothersome leakage with urgency - We discussed the symptoms of overactive bladder (OAB), which include urinary urgency, urinary frequency, nocturia, with or without urge incontinence.  While we do not know the exact etiology of OAB, several treatment options exist. We discussed management including behavioral therapy (decreasing bladder irritants, urge suppression strategies, timed voids, bladder retraining), physical therapy, medication; for refractory cases posterior tibial nerve stimulation, sacral neuromodulation, and intravesical botulinum toxin injection.  For anticholinergic medications, we discussed the potential side effects of anticholinergics including dry eyes, dry mouth, constipation, cognitive impairment and urinary retention. For Beta-3 agonist medication, we discussed the potential side effect of elevated blood pressure which is more likely to occur in individuals with uncontrolled hypertension. - continue Gemtesa  - unable to start anti-cholinergic due to concomitant K supplementation for lasix  use. Prior trial of 2 medications with relief - For refractory OAB we reviewed the procedure for intravesical Botox injection with cystoscopy in the office and reviewed the risks, benefits and alternatives of treatment including but not limited to infection, need for self-catheterization and need for repeat therapy.  We discussed that there is a 5-15% chance of needing to catheterize with Botox and that this usually resolves in a few months; however can persist for longer periods of time.  Typically Botox injections would need to be repeated every 3-12 months since this is not a permanent therapy.  - underwent CIC teaching without difficulty. Patient desires to proceed with Botox injection - We discussed the role of sacral neuromodulation and how it works. It requires a test phase, and documentation of bladder function via diary. After a successful test period, a permanent wire and  generator are placed in the OR. The battery lasts 5 years on average and would need to be replaced surgically.  The goal of this therapy is at least a 50% improvement in symptoms. It is NOT realistic to expect a 100% cure.  We reviewed the fact that about 30% of patients fail the test phase and are not candidates for permanent generator placement.  We discussed the risk of infection and that the patient would have to switch device to MRI compatible mode during imaging once the device is placed. There are two companies that provide this therapy: [employer] and [employer]. For all procedures, we discussed risks of bleeding, infection, damage to surrounding organs including bowel, bladder, blood vessels, ureters and nerves, need for further surgery, risk of postoperative urinary incontinence or retention with need to catheterize, recurrent prolapse, numbness and weakness at any body site, buttock pain, and the rarer risks of blood clot, heart attack, pneumonia, death.    We also discussed the role of percutaneous tibial nerve stimulation and how it works.  She understands it requires 12 weekly visits for temporary neuromodulation of the sacral nerve roots via the tibial nerve and that she may then require continued tapered treatment.  She will return for the procedure. All questions were answered. - encouraged Kegel exercises  - declines PTNS - encouraged weight reduction and association with pelvic floor disorders

## 2024-02-06 NOTE — Assessment & Plan Note (Signed)
-   symptoms reduced from 2x/week down to 1x/week since fiber supplementation - history of diverticular abscess with IR drain placement in 2016 - continues fiber supplementation and titrate for stool bulking and follow-up with GI if refractory symptoms - Treatment options include anti-diarrhea medication (loperamide/ Imodium OTC or prescription lomotil), fiber supplements, physical therapy, and possible sacral neuromodulation or surgery.   - last colonoscopy in 2021 - reviewed sacral neuromodulation for refractory bladder and bowel symptoms with handout reviewed and discussed that devices are now MRI compatible

## 2024-02-06 NOTE — Assessment & Plan Note (Signed)
-   using size 3 1/4 gellhorn pessary since 2021 - For treatment of pelvic organ prolapse, we discussed options for management including expectant management, conservative management, and surgical management, such as Kegels, a pessary, pelvic floor physical therapy, and specific surgical procedures. - encouraged Kegel exercises - desires to continue pessary management at this time, last pessary check 01/27/24 by Dr. Nikki and established care. Denies vaginal symptoms.  - concerns with pelvic adhesions due to history of diverticular abscess

## 2024-02-06 NOTE — Patient Instructions (Addendum)
 For refractory OAB we reviewed the procedure for intravesical Botox injection with cystoscopy in the office and reviewed the risks, benefits and alternatives of treatment including but not limited to infection, need for self-catheterization and need for repeat therapy.  We discussed that there is a 5-15% chance of needing to catheterize with Botox and that this usually resolves in a few months; however can persist for longer periods of time.  Typically Botox injections would need to be repeated every 3-12 months since this is not a permanent therapy.   We discussed the role of sacral neuromodulation and how it works. It requires a test phase, and documentation of bladder function via diary. After a successful test period, a permanent wire and generator are placed in the OR. The battery lasts 5 years on average and would need to be replaced surgically.  The goal of this therapy is at least a 50% improvement in symptoms. It is NOT realistic to expect a 100% cure.  We reviewed the fact that about 30% of patients fail the test phase and are not candidates for permanent generator placement.  We discussed the risk of infection and that the patient would not be able to get an MRI once the device is placed. There are two companies that provide this therapy: [employer] and [employer]. [employer]' product is new and is similar to [employer]'s, but has advantages of a smaller and rechargeable battery and being able to have an MRI with the implant. For all procedures, we discussed risks of bleeding, infection, damage to surrounding organs including bowel, bladder, blood vessels, ureters and nerves, need for further surgery, risk of postoperative urinary incontinence or retention with need to catheterize, recurrent prolapse, numbness and weakness at any body site, buttock pain, and the rarer risks of blood clot, heart attack, pneumonia, death.    We will contact you regarding botox injection scheduling.

## 2024-04-12 ENCOUNTER — Ambulatory Visit: Admitting: Dermatology

## 2024-04-12 VITALS — BP 151/79 | HR 99

## 2024-04-12 DIAGNOSIS — D1723 Benign lipomatous neoplasm of skin and subcutaneous tissue of right leg: Secondary | ICD-10-CM

## 2024-04-12 DIAGNOSIS — D170 Benign lipomatous neoplasm of skin and subcutaneous tissue of head, face and neck: Secondary | ICD-10-CM

## 2024-04-12 DIAGNOSIS — D485 Neoplasm of uncertain behavior of skin: Secondary | ICD-10-CM | POA: Diagnosis not present

## 2024-04-12 DIAGNOSIS — D2271 Melanocytic nevi of right lower limb, including hip: Secondary | ICD-10-CM

## 2024-04-12 DIAGNOSIS — D1730 Benign lipomatous neoplasm of skin and subcutaneous tissue of unspecified sites: Secondary | ICD-10-CM

## 2024-04-12 NOTE — Progress Notes (Unsigned)
   New Patient Visit   Subjective  Veronica [name] is a 73 y.o. female who presents for the following:   Patient went to a dermatologist about 10 years ago to address the soft tissue nodule on her glabella.  Patient has a skin nodule on the right hip that has been present for 30 years. Patient has a skin nodular on the back of the right leg that became bothersome over the summer.   The following portions of the chart were reviewed this encounter and updated as appropriate: medications, allergies, medical history  Review of Systems:  No other skin or systemic complaints except as noted in HPI or Assessment and Plan.  Objective  Well appearing patient in no apparent distress; mood and affect are within normal limits.  A focused examination was performed of the following areas: Right lower leg Face Right hip  Relevant exam findings are noted in the Assessment and Plan.  mid forehead 2.2 cm  subcutaneous nodule Right Thigh - Posterior 6mm pedunculated papule   Assessment & Plan   Nevus Lipomatosus- right hip Patient presents with a soft, flesh-colored, pedunculated plaque on the left lower back, consistent with nevus lipomatosus cutaneous superficialis. Lesion is non-tender, with no ulceration, discharge, or signs of infection. No history of rapid growth or recent changes reported. 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 No evidence of malignancy or systemic involvement. Patient reassured regarding the benign nature of the lesion. Management options including observation versus elective surgical excision for cosmetic reasons were reviewed. Patient opts for conservative management with monitoring. Advised to return if the lesion changes in size, color, or texture, or if new symptoms such as pain or bleeding occur. Prognosis is excellent.  Lipoma  Exam: Subcutaneous rubbery  nodule(s) Location: mid forehead  Benign-appearing. Exam most consistent with an Lipoma. Discussed that a Lipoma is a benign fatty growth that can grow over time and sometimes become painful or otherwise symptomatic. Some patients may have one or several lipomas. Patient would like removal, referral sent to plastic surgery, Dr. Eustacio  NEVUS LIPOMATOSUS CUTANEOUS SUPERFICIALIS   LIPOMA OF FOREHEAD   Related Procedures Ambulatory referral to Plastic Surgery LIPOMA OF FACE mid forehead NEOPLASM OF UNCERTAIN BEHAVIOR OF SKIN Right Thigh - Posterior Skin / nail biopsy Type of biopsy: tangential   Informed consent: discussed and consent obtained   Timeout: patient name, date of birth, surgical site, and procedure verified   Procedure prep:  Patient was prepped and draped in usual sterile fashion Prep type:  Isopropyl alcohol Anesthesia: the lesion was anesthetized in a standard fashion   Anesthetic:  1% lidocaine  w/ epinephrine 1-100,000 buffered w/ 8.4% NaHCO3 Instrument used: DermaBlade   Hemostasis achieved with: aluminum chloride   Outcome: patient tolerated procedure well   Post-procedure details: sterile dressing applied and wound care instructions given   Dressing type: bandage and petrolatum    Specimen 1 - Surgical pathology Differential Diagnosis: r/o atypical nevus vs other  Check Margins: No  Return if symptoms worsen or fail to improve.  LILLETTE Rollene Gobble, RN, am acting as scribe for RUFUS CHRISTELLA HOLY, MD .   Documentation: I have reviewed the above documentation for accuracy and completeness, and I agree with the above.  RUFUS CHRISTELLA HOLY, MD

## 2024-04-12 NOTE — Patient Instructions (Signed)
[phone]  [phone]   

## 2024-04-13 ENCOUNTER — Encounter: Payer: Self-pay | Admitting: Dermatology

## 2024-04-14 ENCOUNTER — Ambulatory Visit: Payer: Self-pay | Admitting: Dermatology

## 2024-04-14 LAB — SURGICAL PATHOLOGY

## 2024-04-17 ENCOUNTER — Other Ambulatory Visit: Payer: Self-pay | Admitting: Internal Medicine

## 2024-04-19 ENCOUNTER — Other Ambulatory Visit: Payer: Self-pay
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
## 2024-04-28 ENCOUNTER — Encounter: Payer: Self-pay | Admitting: Obstetrics and Gynecology

## 2024-04-28 ENCOUNTER — Ambulatory Visit: Admitting: Obstetrics and Gynecology

## 2024-04-28 ENCOUNTER — Ambulatory Visit

## 2024-04-28 VITALS — BP 124/82 | HR 91

## 2024-04-28 VITALS — BP 117/83 | HR 98 | Ht 63.0 in | Wt 246.6 lb

## 2024-04-28 DIAGNOSIS — N813 Complete uterovaginal prolapse: Secondary | ICD-10-CM | POA: Diagnosis not present

## 2024-04-28 DIAGNOSIS — N760 Acute vaginitis: Secondary | ICD-10-CM | POA: Diagnosis not present

## 2024-04-28 DIAGNOSIS — Z4689 Encounter for fitting and adjustment of other specified devices: Secondary | ICD-10-CM | POA: Diagnosis not present

## 2024-04-28 DIAGNOSIS — R220 Localized swelling, mass and lump, head: Secondary | ICD-10-CM

## 2024-04-28 DIAGNOSIS — T8369XA Infection and inflammatory reaction due to other prosthetic device, implant and graft in genital tract, initial encounter: Secondary | ICD-10-CM | POA: Diagnosis not present

## 2024-04-28 DIAGNOSIS — D170 Benign lipomatous neoplasm of skin and subcutaneous tissue of head, face and neck: Secondary | ICD-10-CM

## 2024-04-28 MED ORDER — ESTRADIOL 0.01 % VA CREA: TOPICAL_CREAM | VAGINAL | 1 refills | Status: AC
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
## 2024-05-04 ENCOUNTER — Other Ambulatory Visit: Payer: Self-pay | Admitting: Internal Medicine

## 2024-05-04 ENCOUNTER — Other Ambulatory Visit: Payer: Self-pay

## 2024-05-17 ENCOUNTER — Other Ambulatory Visit: Payer: Self-pay | Admitting: Cardiology

## 2024-05-27 ENCOUNTER — Encounter: Payer: Self-pay | Admitting: Obstetrics

## 2024-05-27 ENCOUNTER — Ambulatory Visit: Admitting: Obstetrics

## 2024-05-27 VITALS — BP 128/80 | HR 90

## 2024-05-27 DIAGNOSIS — N813 Complete uterovaginal prolapse: Secondary | ICD-10-CM

## 2024-05-27 DIAGNOSIS — N3946 Mixed incontinence: Secondary | ICD-10-CM

## 2024-05-27 DIAGNOSIS — R339 Retention of urine, unspecified: Secondary | ICD-10-CM

## 2024-05-27 DIAGNOSIS — N3281 Overactive bladder: Secondary | ICD-10-CM

## 2024-05-27 LAB — POCT URINALYSIS DIP (CLINITEK)
Bilirubin, UA: NEGATIVE
Blood, UA: NEGATIVE
Glucose, UA: NEGATIVE mg/dL
Ketones, POC UA: NEGATIVE mg/dL
Nitrite, UA: NEGATIVE
POC PROTEIN,UA: NEGATIVE
Spec Grav, UA: 1.01 (ref 1.010–1.025)
Urobilinogen, UA: 0.2 U/dL
pH, UA: 5.5 (ref 5.0–8.0)

## 2024-05-27 MED ORDER — ONABOTULINUMTOXINA 100 UNITS IJ SOLR
100.0000 [IU] | Freq: Once | INTRAMUSCULAR | Status: AC
  Administered 2024-05-27: 100 [IU] via INTRAMUSCULAR

## 2024-05-27 MED ORDER — PHENAZOPYRIDINE HCL 200 MG PO TABS: 200.0000 mg | ORAL_TABLET | Freq: Three times a day (TID) | ORAL | 0 refills | Status: AC | PRN

## 2024-05-27 MED ORDER — SODIUM CHLORIDE (PF) 0.9 % IJ SOLN
10.0000 mL | Freq: Once | INTRAMUSCULAR | Status: AC
  Administered 2024-05-27: 10 mL

## 2024-05-27 NOTE — Patient Instructions (Addendum)
 Taking Care of Yourself after Urodynamics, Cystoscopy, Bulkamid Injection, or Botox Injection   Drink plenty of water for a day or two following your procedure. Try to have about 8 ounces (one cup) at a time, and do this 6 times or more per day unless you have fluid restrictitons AVOID irritative beverages such as coffee, tea, soda, alcoholic or citrus drinks for a day or two, as this may cause burning with urination.  For the first 1-2 days after the procedure, your urine may be pink or red in color. You may have some blood in your urine as a normal side effect of the procedure. Large amounts of bleeding or difficulty urinating are NOT normal. Call the nurse line if this happens or go to the nearest Emergency Room if the bleeding is heavy or you cannot urinate at all and it is after hours. If you had a Bulkamid injection in the urethra and need to be catheterized, ask for a pediatric catheter to be used (size 10 or 12-French) so the material is not pushed out of place.   You may experience some discomfort or a burning sensation with urination after having this procedure. You can use over the counter Azo or pyridium to help with burning and follow the instructions on the packaging. If it does not improve within 1-2 days, or other symptoms appear (fever, chills, or difficulty urinating) call the office to speak to a nurse.  You may return to normal daily activities such as work, school, driving, exercising and housework on the day of the procedure. If your doctor gave you a prescription, take it as ordered.    You can stop Gemtesa  in 1-2 weeks, resume if your urinary symptoms return.

## 2024-05-27 NOTE — Progress Notes (Signed)
 Intravesical Botox Procedure:  73 y.o. yo F with mixed urinary incontinence presenting today for intravesical botox.   Most bothered by urinary urgency, frequency and fear of leakage.  UUI mostly with position change, 4-5x/day on Gemtesa  SUI 1-2x/week  Continue size 3 1/4 gellhorn pessary management by Dr. Nikki on 04/28/24 and started vaginal estrogen due to irritation and ulceration noted. Denies vaginal bleeding/discharge with return visit with Dr. Nikki 06/29/24  Vitals:   05/27/24 1302  BP: 128/80  Pulse: 90    Results for orders placed or performed in visit on 05/27/24 (from the past 24 hours)  POCT URINALYSIS DIP (CLINITEK)     Status: Abnormal   Collection Time: 05/27/24 10:53 AM  Result Value Ref Range   Color, UA yellow yellow   Clarity, UA clear clear   Glucose, UA negative negative mg/dL   Bilirubin, UA negative negative   Ketones, POC UA negative negative mg/dL   Spec Grav, UA 8.989 8.989 - 1.025   Blood, UA negative negative   pH, UA 5.5 5.0 - 8.0   POC PROTEIN,UA negative negative, trace   Urobilinogen, UA 0.2 0.2 or 1.0 E.U./dL   Nitrite, UA Negative Negative   Leukocytes, UA Trace (A) Negative     Prior to the procedure, the patient took Ciprofloxacin  for antibiotic prophylaxis. Time out was performed. The bladder was catherized and 50 ml of 2% lidocaine  was placed in the bladder and 10 ml of 2% lidocaine  jelly placed in the urethra. After 30 minutes the lidocaine  was drained. Cystoscopy was performed with sterile H2O and a 70 degree scope. Bladder mucosa was noted to be within normal limits. A total of 10 ml / 100 units of Botox A,  Lot # [id number] Exp 04/2026  was injected in the detrusor muscle via 5 injections of 2ml each. These were spaced about 1 cm apart. Care was taken to avoid the ureteral orifices and the trigone. Patient tolerated the procedure well.  Impression: 73 y.o. 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 Patient tolerated  procedure well.  Plan: Post-procedure instructions given regarding bleeding, infection, urinary retention.  Self-catheterization teaching was previously performed.   Patient will follow up in 4 weeks - can stop Gemtesa  in 1-2 weeks, All questions answered.  Lianne ONEIDA Gillis, MD

## 2024-05-27 NOTE — Addendum Note (Signed)
 Addended by: KRYSTAL ANDREE GAILS on: 05/27/2024 02:37 PM   Modules accepted: Orders

## 2024-06-25 ENCOUNTER — Ambulatory Visit: Admitting: Obstetrics

## 2024-06-25 ENCOUNTER — Encounter: Payer: Self-pay | Admitting: Obstetrics

## 2024-06-25 VITALS — BP 103/67 | HR 81

## 2024-06-25 DIAGNOSIS — N3946 Mixed incontinence: Secondary | ICD-10-CM

## 2024-06-25 DIAGNOSIS — N813 Complete uterovaginal prolapse: Secondary | ICD-10-CM

## 2024-06-25 NOTE — Progress Notes (Signed)
 [facility]  Date of Visit: 06/25/2024  History of Present Illness: Ms. Veronica [name] is a 74 y.o. female scheduled today for a Botox  injection follow-up visit.   s/p 100U bladder botox  on 05/27/24  Today she reports 70% improvement of urinary symptoms  UUI 1-2x/day with reduced leak volume, started 1 week after botox  (baseline 4-5x/day) managed by 2-3 pads with downgraded pad size.  Stopped Gemtesa  SUI 1-2x/week resolved Pending follow-up with Dr. Nikki regarding pessary Continue vaginal estrogen 1g 2x/week  UTI in the last 4 weeks? No  Pain? No   Medications: She has a current medication list which includes the following prescription(s): acetaminophen , amlodipine , aspirin  ec, thera-d 2000, cyclosporine, estradiol , furosemide , gemtesa , ibuprofen, irbesartan -hydrochlorothiazide , menthol (topical analgesic), OVER THE COUNTER MEDICATION, pantoprazole , phenazopyridine , potassium chloride , psyllium, and rosuvastatin .   Allergies: Patient is allergic to clarithromycin.   Physical Exam: BP 103/67   Pulse 81   LMP 06/17/1997    Pelvic exam deferred   Post Void Residual - 06/25/24 1618       Post Void Residual   Post Void Residual 17 mL          ---------------------------------------------------------  Assessment and Plan:  1. Mixed stress and urge urinary incontinence   2. Complete uterovaginal prolapse     Mixed stress and urge urinary incontinence Assessment & Plan: - reports most bothersome leakage with urgency > stress - We discussed the symptoms of overactive bladder (OAB), which include urinary urgency, urinary frequency, nocturia, with or without urge incontinence.  While we do not know the exact etiology of OAB, several treatment options exist. We discussed management including behavioral therapy (decreasing bladder irritants, urge suppression strategies, timed voids, bladder retraining), physical therapy, medication; for refractory cases posterior tibial  nerve stimulation, sacral neuromodulation, and intravesical botulinum toxin injection.  For anticholinergic medications, we discussed the potential side effects of anticholinergics including dry eyes, dry mouth, constipation, cognitive impairment and urinary retention. For Beta-3 agonist medication, we discussed the potential side effect of elevated blood pressure which is more likely to occur in individuals with uncontrolled hypertension. - discontinue Gemtesa  since bladder botox  injection 05/27/24 - unable to start anti-cholinergic due to concomitant K supplementation for lasix  use. Prior trial of 2 medications with relief - Reviewed that typically Botox  injections would need to be repeated every 3-12 months since this is not a permanent therapy.  - underwent CIC teaching without difficulty - encouraged Kegel exercises  - encouraged weight reduction and association with pelvic floor disorders - advised pt to resume gemtesa  if bothersome urinary leakage symptoms or return if changes in urinary or vaginal symptoms   Complete uterovaginal prolapse Assessment & Plan: - using size 3 1/4 gellhorn pessary since 2021 managed by Dr. Nikki, pending follow-up 06/29/24 - For treatment of pelvic organ prolapse, we discussed options for management including expectant management, conservative management, and surgical management, such as Kegels, a pessary, pelvic floor physical therapy, and specific surgical procedures. - encouraged Kegel exercises - desires to continue pessary management at this time, last pessary check 04/28/24 by Dr. Nikki. Denies vaginal symptoms.  - concerns with pelvic adhesions due to history of diverticular abscess      All questions answered.   Return in about 3 months (around 09/23/2024).

## 2024-06-25 NOTE — Assessment & Plan Note (Signed)
-   using size 3 1/4 gellhorn pessary since 2021 managed by Dr. Nikki, pending follow-up 06/29/24 - For treatment of pelvic organ prolapse, we discussed options for management including expectant management, conservative management, and surgical management, such as Kegels, a pessary, pelvic floor physical therapy, and specific surgical procedures. - encouraged Kegel exercises - desires to continue pessary management at this time, last pessary check 04/28/24 by Dr. Nikki. Denies vaginal symptoms.  - concerns with pelvic adhesions due to history of diverticular abscess

## 2024-06-25 NOTE — Patient Instructions (Addendum)
 Please continue vaginal estrogen 1g 2-3x/week.   Please return if you experience any change in urinary or vaginal symptoms.   Consider resuming Gemtesa  if you experience increased urinary symptoms.

## 2024-06-25 NOTE — Assessment & Plan Note (Addendum)
-   reports most bothersome leakage with urgency > stress - We discussed the symptoms of overactive bladder (OAB), which include urinary urgency, urinary frequency, nocturia, with or without urge incontinence.  While we do not know the exact etiology of OAB, several treatment options exist. We discussed management including behavioral therapy (decreasing bladder irritants, urge suppression strategies, timed voids, bladder retraining), physical therapy, medication; for refractory cases posterior tibial nerve stimulation, sacral neuromodulation, and intravesical botulinum toxin injection.  For anticholinergic medications, we discussed the potential side effects of anticholinergics including dry eyes, dry mouth, constipation, cognitive impairment and urinary retention. For Beta-3 agonist medication, we discussed the potential side effect of elevated blood pressure which is more likely to occur in individuals with uncontrolled hypertension. - discontinue Gemtesa  since bladder botox  injection 05/27/24 - unable to start anti-cholinergic due to concomitant K supplementation for lasix  use. Prior trial of 2 medications with relief - Reviewed that typically Botox  injections would need to be repeated every 3-12 months since this is not a permanent therapy.  - underwent CIC teaching without difficulty - encouraged Kegel exercises  - encouraged weight reduction and association with pelvic floor disorders - advised pt to resume gemtesa  if bothersome urinary leakage symptoms or return if changes in urinary or vaginal symptoms

## 2024-06-28 ENCOUNTER — Encounter: Payer: Self-pay | Admitting: *Deleted

## 2024-06-29 ENCOUNTER — Telehealth: Payer: Self-pay | Admitting: Obstetrics and Gynecology

## 2024-06-29 ENCOUNTER — Encounter: Payer: Self-pay | Admitting: Obstetrics and Gynecology

## 2024-06-29 ENCOUNTER — Ambulatory Visit: Admitting: Obstetrics and Gynecology

## 2024-06-29 VITALS — BP 118/78 | HR 89

## 2024-06-29 DIAGNOSIS — Z4689 Encounter for fitting and adjustment of other specified devices: Secondary | ICD-10-CM

## 2024-06-29 DIAGNOSIS — R159 Full incontinence of feces: Secondary | ICD-10-CM

## 2024-06-29 DIAGNOSIS — N3281 Overactive bladder: Secondary | ICD-10-CM

## 2024-06-29 DIAGNOSIS — N813 Complete uterovaginal prolapse: Secondary | ICD-10-CM

## 2024-06-29 NOTE — Telephone Encounter (Signed)
 Please place referral to [facility] pelvic floor therapy for fecal incontinence.
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
## 2024-06-29 NOTE — Telephone Encounter (Signed)
 Referral placed for pelvic floor PT and authorized.   Routing [place].   Encounter closed.

## 2024-06-29 NOTE — Telephone Encounter (Signed)
 Patient also has diagnoses of overactive bladder and complete uterovaginal prolapse.  She uses a pessary.

## 2024-07-06 ENCOUNTER — Encounter: Payer: Self-pay | Admitting: Internal Medicine

## 2024-07-06 ENCOUNTER — Ambulatory Visit: Admitting: Internal Medicine

## 2024-07-06 ENCOUNTER — Ambulatory Visit: Payer: Self-pay | Admitting: Internal Medicine

## 2024-07-06 VITALS — BP 120/72 | HR 88 | Temp 98.5°F | Ht 63.0 in | Wt 252.0 lb

## 2024-07-06 DIAGNOSIS — R739 Hyperglycemia, unspecified: Secondary | ICD-10-CM

## 2024-07-06 DIAGNOSIS — N183 Chronic kidney disease, stage 3 unspecified: Secondary | ICD-10-CM | POA: Insufficient documentation

## 2024-07-06 DIAGNOSIS — G629 Polyneuropathy, unspecified: Secondary | ICD-10-CM | POA: Diagnosis not present

## 2024-07-06 DIAGNOSIS — I10 Essential (primary) hypertension: Secondary | ICD-10-CM

## 2024-07-06 DIAGNOSIS — N1831 Chronic kidney disease, stage 3a: Secondary | ICD-10-CM | POA: Diagnosis not present

## 2024-07-06 DIAGNOSIS — E559 Vitamin D deficiency, unspecified: Secondary | ICD-10-CM | POA: Diagnosis not present

## 2024-07-06 DIAGNOSIS — E7800 Pure hypercholesterolemia, unspecified: Secondary | ICD-10-CM | POA: Diagnosis not present

## 2024-07-06 LAB — BASIC METABOLIC PANEL WITH GFR
BUN: 28 mg/dL — ABNORMAL HIGH (ref 6–23)
CO2: 30 meq/L (ref 19–32)
Calcium: 9.3 mg/dL (ref 8.4–10.5)
Chloride: 99 meq/L (ref 96–112)
Creatinine, Ser: 0.97 mg/dL (ref 0.40–1.20)
GFR: 57.31 mL/min — ABNORMAL LOW
Glucose, Bld: 90 mg/dL (ref 70–99)
Potassium: 3.5 meq/L (ref 3.5–5.1)
Sodium: 135 meq/L (ref 135–145)

## 2024-07-06 LAB — CBC WITH DIFFERENTIAL/PLATELET
Basophils Absolute: 0 K/uL (ref 0.0–0.1)
Basophils Relative: 0.7 % (ref 0.0–3.0)
Eosinophils Absolute: 0.1 K/uL (ref 0.0–0.7)
Eosinophils Relative: 2.2 % (ref 0.0–5.0)
HCT: 33.6 % — ABNORMAL LOW (ref 36.0–46.0)
Hemoglobin: 11.3 g/dL — ABNORMAL LOW (ref 12.0–15.0)
Lymphocytes Relative: 22.8 % (ref 12.0–46.0)
Lymphs Abs: 1.5 K/uL (ref 0.7–4.0)
MCHC: 33.5 g/dL (ref 30.0–36.0)
MCV: 88.3 fl (ref 78.0–100.0)
Monocytes Absolute: 0.5 K/uL (ref 0.1–1.0)
Monocytes Relative: 7.5 % (ref 3.0–12.0)
Neutro Abs: 4.3 K/uL (ref 1.4–7.7)
Neutrophils Relative %: 66.8 % (ref 43.0–77.0)
Platelets: 227 K/uL (ref 150.0–400.0)
RBC: 3.81 Mil/uL — ABNORMAL LOW (ref 3.87–5.11)
RDW: 13.7 % (ref 11.5–15.5)
WBC: 6.4 K/uL (ref 4.0–10.5)

## 2024-07-06 LAB — HEPATIC FUNCTION PANEL
ALT: 12 U/L (ref 3–35)
AST: 19 U/L (ref 5–37)
Albumin: 4 g/dL (ref 3.5–5.2)
Alkaline Phosphatase: 66 U/L (ref 39–117)
Bilirubin, Direct: 0.2 mg/dL (ref 0.1–0.3)
Total Bilirubin: 1.1 mg/dL (ref 0.2–1.2)
Total Protein: 8.1 g/dL (ref 6.0–8.3)

## 2024-07-06 MED ORDER — ROSUVASTATIN CALCIUM 10 MG PO TABS: 10.0000 mg | ORAL_TABLET | Freq: Every day | ORAL | 3 refills | Status: AC

## 2024-07-06 MED ORDER — AMITRIPTYLINE HCL 50 MG PO TABS: 50.0000 mg | ORAL_TABLET | Freq: Every day | ORAL | 1 refills | Status: AC

## 2024-07-06 MED ORDER — PANTOPRAZOLE SODIUM 40 MG PO TBEC: DELAYED_RELEASE_TABLET | ORAL | 1 refills | Status: AC

## 2024-07-06 MED ORDER — GEMTESA 75 MG PO TABS: ORAL_TABLET | ORAL | 3 refills | Status: AC

## 2024-07-06 MED ORDER — AMLODIPINE BESYLATE 5 MG PO TABS: ORAL_TABLET | ORAL | 3 refills | Status: AC

## 2024-07-06 MED ORDER — POTASSIUM CHLORIDE ER 10 MEQ PO TBCR: EXTENDED_RELEASE_TABLET | ORAL | 3 refills | Status: AC

## 2024-07-06 MED ORDER — IRBESARTAN-HYDROCHLOROTHIAZIDE 300-12.5 MG PO TABS: 1.0000 | ORAL_TABLET | Freq: Every day | ORAL | 3 refills | Status: AC
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
## 2024-07-06 NOTE — Patient Instructions (Signed)
 Please take all new medication as prescribed - the Elavil  50 mg at bedtime as needed for neuropathy  Please continue all other medications as before, and refills have been done if requested.  Please have the pharmacy call with any other refills you may need.  Please continue your efforts at being more active, low cholesterol diet, and weight control.  Please keep your appointments with your specialists as you may have planned - PT for pelvic floor excercises  Please go to the LAB at the blood drawing area for the tests to be done  You will be contacted by phone if any changes need to be made immediately.  Otherwise, you will receive a letter about your results with an explanation, but please check with MyChart first.  Please make an Appointment to return in 6 months, or sooner if needed

## 2024-07-06 NOTE — Assessment & Plan Note (Signed)
 Mild to mod, for elavil  50 mg at bedtime prn

## 2024-07-06 NOTE — Progress Notes (Signed)
 The test results show that your current treatment is OK, as the tests are stable.  Please continue the same plan.  There is no other need for change of treatment or further evaluation based on these results, at this time.  thanks

## 2024-07-06 NOTE — Assessment & Plan Note (Signed)
 Lab Results  Component Value Date   HGBA1C 5.5 01/02/2024   Stable, pt to continue current medical treatment  - diet, wt control

## 2024-07-06 NOTE — Assessment & Plan Note (Signed)
 Lab Results  Component Value Date   CREATININE 1.05 01/02/2024   Stable overall, cont to avoid nephrotoxins ckd3a

## 2024-07-06 NOTE — Assessment & Plan Note (Signed)
 Last vitamin D  Lab Results  Component Value Date   VD25OH 53.97 01/02/2024   Stable, cont oral replacement

## 2024-07-06 NOTE — Assessment & Plan Note (Signed)
 Lab Results  Component Value Date   LDLCALC 58 01/02/2024   Stable, pt to continue current statin crestor  10 mg qd

## 2024-07-06 NOTE — Assessment & Plan Note (Signed)
 BP Readings from Last 3 Encounters:  07/06/24 120/72  06/29/24 118/78  06/25/24 103/67   Stable, pt to continue medical treatment norvasc  5 every day, avalide 300 12.5 mg qd

## 2024-09-02 ENCOUNTER — Ambulatory Visit: Admitting: Physical Therapy

## 2024-10-01 ENCOUNTER — Ambulatory Visit: Admitting: Obstetrics

## 2024-11-18 ENCOUNTER — Ambulatory Visit

## 2024-12-13 ENCOUNTER — Ambulatory Visit: Admitting: Family Medicine
